# Patient Record
Sex: Male | Born: 1975 | Race: White | Hispanic: No | Marital: Married | Smoking: Never smoker
Health system: Southern US, Community
[De-identification: ages and names within clinical notes are randomized; demographics above are authoritative.]

## PROBLEM LIST (undated history)

## (undated) ENCOUNTER — Emergency Department: Discharge status: Absent without leave | Payer: Self-pay

## (undated) DIAGNOSIS — J189 Pneumonia, unspecified organism: Secondary | ICD-10-CM

## (undated) DIAGNOSIS — Z87442 Personal history of urinary calculi: Secondary | ICD-10-CM

## (undated) DIAGNOSIS — I10 Essential (primary) hypertension: Secondary | ICD-10-CM

## (undated) DIAGNOSIS — T7840XA Allergy, unspecified, initial encounter: Secondary | ICD-10-CM

## (undated) DIAGNOSIS — Z9109 Other allergy status, other than to drugs and biological substances: Secondary | ICD-10-CM

## (undated) DIAGNOSIS — J329 Chronic sinusitis, unspecified: Secondary | ICD-10-CM

## (undated) DIAGNOSIS — G4730 Sleep apnea, unspecified: Secondary | ICD-10-CM

## (undated) DIAGNOSIS — E785 Hyperlipidemia, unspecified: Secondary | ICD-10-CM

## (undated) HISTORY — DX: Allergy, unspecified, initial encounter: T78.40XA

## (undated) HISTORY — PX: WISDOM TOOTH EXTRACTION: SHX21

## (undated) HISTORY — PX: CHOLECYSTECTOMY: SHX55

## (undated) HISTORY — PX: SHOULDER SURGERY: SHX246

## (undated) HISTORY — PX: COLONOSCOPY: SHX174

## (undated) HISTORY — PX: KNEE SURGERY: SHX244

## (undated) HISTORY — PX: BACK SURGERY: SHX140

---

## 2006-06-02 ENCOUNTER — Ambulatory Visit (HOSPITAL_COMMUNITY): Admission: RE | Admit: 2006-06-02 | Discharge: 2006-06-02 | Payer: Self-pay | Admitting: Family Medicine

## 2006-06-03 ENCOUNTER — Inpatient Hospital Stay (HOSPITAL_COMMUNITY): Admission: EM | Admit: 2006-06-03 | Discharge: 2006-06-08 | Payer: Self-pay | Admitting: Emergency Medicine

## 2006-06-06 ENCOUNTER — Encounter (INDEPENDENT_AMBULATORY_CARE_PROVIDER_SITE_OTHER): Payer: Self-pay | Admitting: Specialist

## 2007-04-15 ENCOUNTER — Emergency Department (HOSPITAL_COMMUNITY): Admission: EM | Admit: 2007-04-15 | Discharge: 2007-04-15 | Payer: Self-pay | Admitting: Family Medicine

## 2007-05-06 ENCOUNTER — Emergency Department (HOSPITAL_COMMUNITY): Admission: EM | Admit: 2007-05-06 | Discharge: 2007-05-06 | Payer: Self-pay | Admitting: Emergency Medicine

## 2007-11-05 ENCOUNTER — Encounter: Admission: RE | Admit: 2007-11-05 | Discharge: 2007-11-05 | Payer: Self-pay | Admitting: Family Medicine

## 2007-12-13 ENCOUNTER — Ambulatory Visit: Payer: Self-pay

## 2009-11-21 ENCOUNTER — Emergency Department (HOSPITAL_COMMUNITY): Admission: EM | Admit: 2009-11-21 | Discharge: 2009-11-21 | Payer: Self-pay | Admitting: Emergency Medicine

## 2010-10-05 ENCOUNTER — Encounter: Payer: Self-pay | Admitting: Emergency Medicine

## 2010-10-05 ENCOUNTER — Ambulatory Visit (INDEPENDENT_AMBULATORY_CARE_PROVIDER_SITE_OTHER): Payer: 59 | Admitting: Emergency Medicine

## 2010-10-05 DIAGNOSIS — J069 Acute upper respiratory infection, unspecified: Secondary | ICD-10-CM

## 2010-10-12 ENCOUNTER — Encounter: Payer: Self-pay | Admitting: Family Medicine

## 2010-10-12 ENCOUNTER — Ambulatory Visit (INDEPENDENT_AMBULATORY_CARE_PROVIDER_SITE_OTHER): Payer: 59 | Admitting: Family Medicine

## 2010-10-12 ENCOUNTER — Ambulatory Visit
Admission: RE | Admit: 2010-10-12 | Discharge: 2010-10-12 | Disposition: A | Discharge status: Home / Self Care | Payer: 59 | Source: Ambulatory Visit | Attending: Family Medicine | Admitting: Family Medicine

## 2010-10-12 ENCOUNTER — Other Ambulatory Visit: Payer: Self-pay | Admitting: Family Medicine

## 2010-10-12 DIAGNOSIS — R05 Cough: Secondary | ICD-10-CM

## 2010-10-12 DIAGNOSIS — J45909 Unspecified asthma, uncomplicated: Secondary | ICD-10-CM | POA: Insufficient documentation

## 2010-10-12 DIAGNOSIS — J069 Acute upper respiratory infection, unspecified: Secondary | ICD-10-CM

## 2010-10-12 DIAGNOSIS — R059 Cough, unspecified: Secondary | ICD-10-CM

## 2010-10-12 NOTE — Assessment & Plan Note (Signed)
Summary: SINUS PROBLEMS,COUGH, CHEST PAIN/TJ rm 4   Vital Signs:  Patient Profile:   35 Years Old Male CC:      Cold & URI symptoms Height:     66 inches Weight:      175 pounds O2 Sat:      99 % O2 treatment:    Room Air Temp:     99.2 degrees F oral Pulse rate:   71 / minute Resp:     16 per minute BP sitting:   134 / 88  (left arm) Cuff size:   regular  Vitals Entered By: Clemens Catholic LPN (October 05, 2010 6:53 PM)                  Updated Prior Medication List: cholestrol med fish oil multiple vitamin Current Allergies: ! * BEES, CATS, DOGS, GRASS, WEEDSHistory of Present Illness History from: patient Chief Complaint: Cold & URI symptoms History of Present Illness: 35 Years Old Male complains of onset of cold symptoms for a few weeks.  Jenner has been using Biaxin x7 days which is helping a little bit. + sore throat + cough No pleuritic pain No wheezing + nasal congestion + post-nasal drainage + sinus pain/pressure + chest congestion No itchy/red eyes No earache No hemoptysis No SOB + chills/sweats No fever No nausea No vomiting No abdominal pain No diarrhea No skin rashes + fatigue No myalgias No headache   REVIEW OF SYSTEMS Constitutional Symptoms       Complains of fatigue.     Denies fever, chills, night sweats, weight loss, and weight gain.  Eyes       Denies change in vision, eye pain, eye discharge, glasses, contact lenses, and eye surgery. Ear/Nose/Throat/Mouth       Complains of sinus problems and hoarseness.      Denies hearing loss/aids, change in hearing, ear pain, ear discharge, dizziness, frequent runny nose, frequent nose bleeds, sore throat, and tooth pain or bleeding.  Respiratory       Complains of productive cough, wheezing, shortness of breath, and bronchitis.      Denies dry cough, asthma, and emphysema/COPD.  Cardiovascular       Complains of chest pain.      Denies murmurs and tires easily with exhertion.     Gastrointestinal       Denies stomach pain, nausea/vomiting, diarrhea, constipation, blood in bowel movements, and indigestion. Genitourniary       Denies painful urination, kidney stones, and loss of urinary control. Neurological       Denies paralysis, seizures, and fainting/blackouts. Musculoskeletal       Denies muscle pain, joint pain, joint stiffness, decreased range of motion, redness, swelling, muscle weakness, and gout.  Skin       Denies bruising, unusual mles/lumps or sores, and hair/skin or nail changes.  Psych       Denies mood changes, temper/anger issues, anxiety/stress, speech problems, depression, and sleep problems. Other Comments: pt c/o dry cough, worse at night, chest hurts when he coughs sometimes x . he had an old rx for clarithromycin 500mg  1 by mouth two times a day he has taken that for 7days with no improvment. he has also taken OTC Mucinex and OTC cold meds with no relief.    Past History:  Past Medical History: Unremarkable  Past Surgical History: surgery on both knees back surgery shoulder surgery ear surgery Cholecystectomy  Family History: mother CA and heart dz  Social History: Never Smoked Alcohol  use-yes 3 times per year Drug use-no Smoking Status:  never Drug Use:  no Physical Exam General appearance: well developed, well nourished, no acute distress Ears: mild erythema L ear Nasal: mucosa pink, nonedematous, no septal deviation, turbinates normal Oral/Pharynx: tongue normal, posterior pharynx without erythema or exudate Chest/Lungs: no rales, wheezes, or rhonchi bilateral, breath sounds equal without effort Heart: regular rate and  rhythm, no murmur MSE: oriented to time, place, and person Assessment New Problems: ALLERGIC RHINITIS (ICD-477.9) UPPER RESPIRATORY INFECTION, ACUTE (ICD-465.9)   Patient Education: Patient and/or caregiver instructed in the following: rest, fluids.  Plan New  Medications/Changes: PREDNISONE (PAK) 10 MG TABS (PREDNISONE) use as directed (6 day pack)  #1 x 0, 10/05/2010, Hoyt Koch MD AMOXICILLIN 875 MG TABS (AMOXICILLIN) 1 by mouth two times a day for 7 days  #14 x 0, 10/05/2010, Hoyt Koch MD  New Orders: New Patient Level III [phone] Pulse Oximetry (single measurment) [94760] Solumedrol up to 125mg  [J2930] Admin of Therapeutic Inj  intramuscular or subcutaneous [96372] Planning Comments:   1)  Take the prescribed antibiotic as instructed. (wait a few days since like viral vs allergic) 2)  Use nasal saline solution (over the counter) at least 3 times a day. 3)  Use over the counter decongestants like Zyrtec-D every 12 hours as needed to help with congestion. 4)  Can take tylenol every 6 hours or motrin every 8 hours for pain or fever. 5)  Follow up with your primary doctor  if no improvement in 5-7 days, sooner if increasing pain, fever, or new symptoms.  If worsening or not improving, check CXR and CBC.   The patient and/or caregiver has been counseled thoroughly with regard to medications prescribed including dosage, schedule, interactions, rationale for use, and possible side effects and they verbalize understanding.  Diagnoses and expected course of recovery discussed and will return if not improved as expected or if the condition worsens. Patient and/or caregiver verbalized understanding.  Prescriptions: PREDNISONE (PAK) 10 MG TABS (PREDNISONE) use as directed (6 day pack)  #1 x 0   Entered and Authorized by:   Hoyt Koch MD   Signed by:   Hoyt Koch MD on 10/05/2010   Method used:   Print then Give to Patient   RxID:   [id number] AMOXICILLIN 875 MG TABS (AMOXICILLIN) 1 by mouth two times a day for 7 days  #14 x 0   Entered and Authorized by:   Hoyt Koch MD   Signed by:   Hoyt Koch MD on 10/05/2010   Method used:   Print then Give to Patient   RxID:   [id number]   Medication  Administration  Injection # 1:    Medication: Solumedrol up to 125mg     Diagnosis: UPPER RESPIRATORY INFECTION, ACUTE (ICD-465.9)    Route: IM    Site: RUOQ gluteus    Exp Date: 02/12/2013    Lot #: [id number]    Mfr: Pharmacia    Patient tolerated injection without complications    Given by: Clemens Catholic LPN (October 05, 2010 7:19 PM)  Orders Added: 1)  New Patient Level III [99203] 2)  Pulse Oximetry (single measurment) [94760] 3)  Solumedrol up to 125mg  [J2930] 4)  Admin of Therapeutic Inj  intramuscular or subcutaneous [29528]

## 2010-10-21 NOTE — Assessment & Plan Note (Signed)
Summary: CHEST COUGH/TJ (rm3)   Vital Signs:  Patient Profile:   35 Years Old Male CC:      dry cough, wheeze, ear pain, HA, congestion Height:     66 inches Weight:      182 pounds O2 Sat:      97 % O2 treatment:    Room Air Temp:     98.8 degrees F oral Pulse rate:   67 / minute Resp:     16 per minute BP sitting:   121 / 80  (left arm) Cuff size:   large  Vitals Entered By: Lajean Saver RN (October 12, 2010 12:06 PM)                  Updated Prior Medication List: AMOXICILLIN 875 MG TABS (AMOXICILLIN) 1 by mouth two times a day for 7 days  Current Allergies (reviewed today): ! * BEES, CATS, DOGS, GRASS, WEEDSHistory of Present Illness Chief Complaint: dry cough, wheeze, ear pain, HA, congestion History of Present Illness:  Subjective:  Patient reports no improvement since his previous visit, having almost finished his amoxicillin.  He feels worse today with increased fatigue, and persistent non-producitve cough.  He notes increased left earache for two days and a headache.  No fevers, chills, and sweats. He had asthma as a child, and has had about 3 episodes of pneumonia in the past (last episode last year).  He does not smoke.  No GI symptoms  His tetanus immunization is current.  REVIEW OF SYSTEMS Constitutional Symptoms      Denies fever, chills, night sweats, weight loss, weight gain, and fatigue.  Eyes       Denies change in vision, eye pain, eye discharge, glasses, contact lenses, and eye surgery. Ear/Nose/Throat/Mouth       Complains of ear pain and sinus problems.      Denies hearing loss/aids, change in hearing, ear discharge, dizziness, frequent runny nose, frequent nose bleeds, sore throat, hoarseness, and tooth pain or bleeding.  Respiratory       Complains of dry cough, wheezing, and shortness of breath.      Denies productive cough, asthma, bronchitis, and emphysema/COPD.  Cardiovascular       Denies murmurs, chest pain, and tires easily with  exhertion.    Gastrointestinal       Denies stomach pain, nausea/vomiting, diarrhea, constipation, blood in bowel movements, and indigestion. Genitourniary       Denies painful urination, kidney stones, and loss of urinary control. Neurological       Complains of headaches.      Denies paralysis, seizures, and fainting/blackouts. Musculoskeletal       Denies muscle pain, joint pain, joint stiffness, decreased range of motion, redness, swelling, muscle weakness, and gout.  Skin       Denies bruising, unusual mles/lumps or sores, and hair/skin or nail changes.  Psych       Denies mood changes, temper/anger issues, anxiety/stress, speech problems, depression, and sleep problems. Other Comments: Patient was seen by Dr. Orson Aloe about 1 week ago for similiar symptoms that have worsened since. He finished his prednisone and is still taking amox.   Past History:  Past Medical History: Reviewed history from 10/05/2010 and no changes required. Unremarkable  Past Surgical History: Reviewed history from 10/05/2010 and no changes required. surgery on both knees back surgery shoulder surgery ear surgery Cholecystectomy  Family History: Reviewed history from 10/05/2010 and no changes required. mother CA and heart dz  Social History:  Reviewed history from 10/05/2010 and no changes required. Never Smoked Alcohol use-yes 3 times per year Drug use-no   Objective:  Appearance:  Patient appears healthy, stated age, and in no acute distress  Eyes:  Pupils are equal, round, and reactive to light and accomdation.  Extraocular movement is intact.  Conjunctivae are not inflamed.  Ears:  Canals normal.  Tympanic membranes normal.   Nose:  Normal septum.  Normal turbinates, mildly congested.  No sinus tenderness present.  Pharynx:  Normal  Neck:  Supple.  Slightly tender shotty anterior/posterior nodes are palpated bilaterally.  Lungs:  Clear to auscultation.  Breath sounds are equal.  Heart:   Regular rate and rhythm without murmurs, rubs, or gallops.  Abdomen:  Nontender without masses or hepatosplenomegaly.  Bowel sounds are present.  No CVA or flank tenderness.  Extremities:  No edema.  Pedal pulses are full and equal.  Chest X-ray:   IMPRESSION: No active cardiopulmonary process. CBC:  WBC 9.7; Normal diff; Hgb 16.9 Assessment New Problems: Hx of ASTHMA, MILD (ICD-493.90) COUGH (ICD-786.2)  SUSPECT THAT PRESENT INCREASE IN SYMPTOMS REPRESENTS NEW VIRAL URI SUSPECT PRE-EXISTING COUGH FOR 5 WEEKS REPRESENTS ASTHMA  Plan New Medications/Changes: AZITHROMYCIN 250 MG TABS (AZITHROMYCIN) Two tabs by mouth on day 1, then 1 tab daily on days 2 through 5 (Rx void after 10/21/10)  #6 tabs x 0, 10/12/2010, Donna Christen MD VENTOLIN HFA 108 (90 BASE) MCG/ACT AERS (ALBUTEROL SULFATE) 2 inh. q3 to 4hr prn  #1 MDI x 1, 10/12/2010, Donna Christen MD ADVAIR DISKUS 100-50 MCG/DOSE MISC (FLUTICASONE-SALMETEROL) 1 inhalation two times a day  #1 disp pk 60 x 0, 10/12/2010, Donna Christen MD SINGULAIR 10 MG TABS (MONTELUKAST SODIUM) 1 by mouth qhs  #30 x 1, 10/12/2010, Donna Christen MD  New Orders: CBC w/Diff [[id number]] T-Chest x-ray, 2 views [71020] Pulse Oximetry (single measurment) [94760] Est. Patient Level IV [16010] Planning Comments:   Begin Singulair, Advair, and albuterol as needed. Treat URI symptomatically for now:  Increase fluid intake, begin expectorant/decongestant, cough suppressant at bedtime.  If fever/chills/sweats persist, or if not improving 5 to 7 days begin Z-pack (given Rx to hold).  Followup with pulmonologist if not improving 2 weeks.   The patient and/or caregiver has been counseled thoroughly with regard to medications prescribed including dosage, schedule, interactions, rationale for use, and possible side effects and they verbalize understanding.  Diagnoses and expected course of recovery discussed and will return if not improved as expected or if the condition  worsens. Patient and/or caregiver verbalized understanding.  Prescriptions: AZITHROMYCIN 250 MG TABS (AZITHROMYCIN) Two tabs by mouth on day 1, then 1 tab daily on days 2 through 5 (Rx void after 10/21/10)  #6 tabs x 0   Entered and Authorized by:   Donna Christen MD   Signed by:   Donna Christen MD on 10/12/2010   Method used:   Print then Give to Patient   RxID:   [id number] VENTOLIN HFA 108 (90 BASE) MCG/ACT AERS (ALBUTEROL SULFATE) 2 inh. q3 to 4hr prn  #1 MDI x 1   Entered and Authorized by:   Donna Christen MD   Signed by:   Donna Christen MD on 10/12/2010   Method used:   Print then Give to Patient   RxID:   [id number] ADVAIR DISKUS 100-50 MCG/DOSE MISC (FLUTICASONE-SALMETEROL) 1 inhalation two times a day  #1 disp pk 60 x 0   Entered and Authorized by:   Donna Christen MD   Signed by:  Donna Christen MD on 10/12/2010   Method used:   Print then Give to Patient   RxID:   [id number] SINGULAIR 10 MG TABS (MONTELUKAST SODIUM) 1 by mouth qhs  #30 x 1   Entered and Authorized by:   Donna Christen MD   Signed by:   Donna Christen MD on 10/12/2010   Method used:   Print then Give to Patient   RxID:   [id number]   Patient Instructions: 1)  Take Mucinex D (guaifenesin with decongestant) twice daily for congestion. 2)  Increase fluid intake, rest. 3)  May use Afrin nasal spray (or generic oxymetazoline) twice daily for about 5 days.  Also recommend using saline nasal spray several times daily and/or saline nasal irrigation. 4)  Begin Azithromycin if not improving about 5 to 7 days or if persistent fever develops. 5)  Followup with pulmonologist if not improving 2 weeks.  Orders Added: 1)  CBC w/Diff [[id number]] 2)  T-Chest x-ray, 2 views [71020] 3)  Pulse Oximetry (single measurment) [94760] 4)  Est. Patient Level IV [41324]

## 2010-12-31 NOTE — Op Note (Signed)
NAMEISIAHA, GREENUP                ACCOUNT NO.:  [id number]   MEDICAL RECORD NO.:  [id number]          PATIENT TYPE:  INP   LOCATION:  5727                         FACILITY:  [facility]   PHYSICIAN:  Thornton Park. Daphine Deutscher, MD  DATE OF BIRTH:  [birth year]   DATE OF PROCEDURE:  06/06/2006  DATE OF DISCHARGE:                                 OPERATIVE REPORT   PREOPERATIVE DIAGNOSIS:  Biliary colic, chronic cholecystitis.   POSTOPERATIVE DIAGNOSIS:  Biliary colic, chronic cholecystitis.   PROCEDURE:  Laparoscopic cholecystectomy, interoperative cholangiogram.   SURGEON:  Thornton Park. Daphine Deutscher, M.D.   ASSISTANT:  Towana Badger, M.D.   ANESTHESIA:  General endotracheal anesthesia.   DRAINS:  None.   BLOOD LOSS:  Minimal.   DESCRIPTION OF PROCEDURE:  Mr. Pitera is a 35 year old white male taken to OR  17 on Tuesday, June 06, 2006, and given general anesthesia.  The abdomen  was prepped with chlorhexidine and draped sterilely.  I made a longitudinal  incision down to the umbilicus and found a small umbilical hernia at the  base which I opened and entered the abdomen without difficulty.  A single  suture was placed to hold the [facility] which was then insufflated.  The  gallbladder was grasped and elevated and I dissected free the cystic duct  and put a clip up on the gallbladder side of the cystic duct.  I put clips  up on the cystic artery and incised the cystic duct and milked out a large  impacted stone as well as several areas of sludge and I put in a Reddick  catheter.  A dynamic cholangiogram was obtained which showed filling of the  fairly long cystic duct and a dilated common duct with flow into the  duodenum.  The cystic duct was then triple clipped and divided. The cystic  artery was divided.  Then, the gallbladder was removed from the gallbladder  bed without entering it using the hook electrocautery.  It was placed in a  bag and brought out through the umbilicus.  I inspected the  gallbladder bed  and no bleeding or bile leaks were noted.  The umbilical defect was repaired  with two sutures of 0 Vicryl in addition to the stay suture that was placed.  I inspected and,  again, no bleeding or bile leaks were noted.  The wounds were injected with  Marcaine and closed with 4-0 Vicryl, Benzoin, and Steri-Strips.  The patient  tolerated the procedure well and was taken to the recovery room in  satisfactory condition.      Thornton Park Daphine Deutscher, MD  Electronically Signed     MBM/MEDQ  D:  06/06/2006  T:  06/07/2006  Job:  [id number]   cc:   Bernette Redbird, M.D.

## 2010-12-31 NOTE — H&P (Signed)
Allen Fisher, DESA NO.:  [id number]   MEDICAL RECORD NO.:  [id number]          PATIENT TYPE:  INP   LOCATION:  5727                         FACILITY:  [facility]   PHYSICIAN:  Bernette Redbird, M.D.   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   Keyshun has been seen by me on one occasion previously in the office, about  a month ago, because of longstanding IBS symptoms characterized by  postprandial urgency of defecation.  He was taken off his Nexium (which he  had been treated for since he was diagnosed with an ulcer some time ago) and  switched to Protonix, and also started on probiotic therapy.  This improved  the diarrhea but led to a sense of bloating and fullness.   With that background, the patient ate a fairly normal meal last night,  followed by a large amount of ice cream and within a couple of hours,  started having significant upper abdominal pain as well as nausea and  vomiting, but no fevers or chills.  From 2:30 in the morning until the  present time, he has been awake with recurring intermittent colicky upper  abdominal pain, not radiating to the back, along with some additional nausea  and vomiting.   Of note, there is a family history of gallbladder disease in his brother who  had his gallbladder removed a year ago at age 5.   The patient called our office to report the above symptoms and was sent to  an Urgent Care Center where apparently a white count was elevated at about  11,000 and he was sent for CT scan as an outpatient this evening which  showed gallstones and mild to moderate pancreatitis.  Upon receipt of that  information, I sent the patient to the emergency room.   At the present time, the patient is in mild to moderate pain, not as  severe  as it has been earlier, during which time there had been points where he has  actually been on the floor writhing in pain.   Also of note is fact that the patient has a history of Gilbert's Syndrome.  He has noticed some dark urine during the course of this illness.   ALLERGIES:  NO KNOWN ALLERGIES.   OUTPATIENT MEDICATIONS:  1. Protonix 40 mg daily.  2. Align probiotic.  3. He also took some Alka-Seltzer and Gas-X today because of his symptoms.   OPERATIONS:  Previous back surgery and knee surgery.   MEDICAL ILLNESSES:  1. He was diagnosed with an ulcer, I believe by endoscopy, in [place] a      year or two ago.  He was started on Nexium at that time.  He has been      on it ever since until I switched him to Protonix about a month ago.  2. There is also history of Gilbert's  Syndrome and, per his wife's report,      he apparently typically runs bilirubins in the 7-8 range.  3. There is no history of cardiopulmonary disease, diabetes, hypertension      or asthma.   HABITS:  Nonsmoker, moderate ethanol.  He did have four beers a couple of  days ago but prior to that, it had been awhile since he had had any alcohol  at all.   FAMILY HISTORY:  Family history positive for gallbladder disease in his  brother, who had his gallbladder removed last year at age 53.  Also positive  for diverticulitis in his father, negative for colon cancer.   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  His wife has some family members in town.  They have a 35-month-  old child.   REVIEW OF SYSTEMS:  Pertinent per HPI.   PHYSICAL EXAMINATION:  GENERAL APPEARANCE:  A well-developed, stout, healthy-  appearing white male who is not in any acute distress at the time of my  evaluation.  VITAL SIGNS:  Temperature 97.1, blood pressure 123/85, pulse 83,  respirations 16 and  unlabored.  HEENT:  The sclerae are mildly icteric.  CHEST:  Clear to auscultation.  HEART:  Normal, no gallops, rubs, murmurs, clicks or arrhythmias.  ABDOMEN:  The abdomen is nondistended.  Bowel sounds are quiet.  No  organomegaly, rigidity or mass effect are noted.  There is no significant  guarding and only mild tenderness to palpation and this was performed before  pain medications were administered.  NEUROLOGIC:  The patient is alert, coherent and appropriate without obvious  cranial nerve or focal motor deficits.   LABORATORY DATA:  Here at the emergency room, the patient's white count is  8700 with 75 polys, 13 lymphs and 9 monocytes.  Hemoglobin is elevated at  17.2 with a hematocrit of 50.  Platelets 158,000.  INR is normal at 1.  Chemistry panel, amylase and lipase are pending at present.   CT scan:  See above report.  Gallstones and moderate pancreatitis are noted.  I discussed this with the radiologist.   IMPRESSION:  1. Abdominal pain of 24 hours duration, with CT findings consistent with      pancreatitis of mild to moderate severity.  2. Gallstones.  3. History of irritable bowel syndrome.  4. History of peptic ulcer disease.  5. History of Gilbert's Syndrome.  6. History of back and knee surgery.   DISCUSSION:  Almost certainly this is gallstone pancreatitis.  The patient  does not have a history of significant ethanol consumption and I think his  recent moderate intake is unrelated to the clinical presentation.  The  pancreatitis appears to be of mild to moderate clinical severity, although  the hemoconcentration characterized by the high hemoglobin level is of some  concern.   PLAN:  1. Aggressive hydration.  2. Symptomatic management of pain and nausea.  3. Observe the patient's clinical evolution by following symptoms,      examination and labs.  4. Surgical consultation in the morning. 5. Further management will depend on the patient's clinical  evolution,      including his response to the above interventions.  He may need      preoperative ERCP, and I would anticipate he may well end up with a      cholecystectomy on this admission,  although hopefully not on an      emergent basis.           ______________________________  Bernette Redbird, M.D.     RB/MEDQ  D:  06/02/2006  T:  06/04/2006  Job:  [id number]

## 2010-12-31 NOTE — Discharge Summary (Signed)
NAMEKENNET, Allen Fisher                ACCOUNT NO.:  [id number]   MEDICAL RECORD NO.:  [id number]          PATIENT TYPE:  INP   LOCATION:  5727                         FACILITY:  [facility]   PHYSICIAN:  Graylin Shiver, M.D.   DATE OF BIRTH:  1976-08-06   DATE OF ADMISSION:  06/02/2006  DATE OF DISCHARGE:  06/08/2006                                 DISCHARGE SUMMARY   ADMISSION DIAGNOSIS:  Acute pancreatitis.   DISCHARGE DIAGNOSES:  1. Resolving pancreatitis.  2. Irritable bowel syndrome.  3. Cholelithiasis.  4. Questionable Guillain Barre's syndrome.  He is now status post      laparoscopic cholecystectomy.   SERVICE:  With gastroenterology, attended with Bernette Redbird.   CONSULT:  [facility].   PROCEDURES:  ERCP on October 22 by Dr. Herbert Moors of [facility], and  laparoscopic cholecystectomy on October 23 by Dr. Haynes Hoehn, [facility].   BRIEF HISTORY:  Mr. Rudd was admitted on October 19 after seeing Dr. Matthias Hughs  in the office.  He had a white count of 11,000 and a CT scan that showed  gallstones and mild to moderate pancreatitis.  He had a 3-week history of  epigastric pain, just recently causing him to double over in pain and  __________ .  He was admitted, underwent the two previously mentioned  procedures, is now recovering very well.  Current vital signs were  temperature of 98.6, respirations of 20, pulse of 54.  Blood pressure is  101/44. CBC is normal, with the exception of platelets of 147.  Chem-7 is  normal.  Liver enzymes show a slightly elevated ALT x4.  His total bilirubin  is 1.1 which is excellent for him.  His lipase was slightly elevated at 74.  His amylase is normal at one 118.   PHYSICAL EXAM:  He is alert, oriented, tolerating clear liquids well.  We  are advancing his diet at lunchtime today.  He is fully using his PCA pump  that is being switched to oral Vicodin.  Still complains of a good amount of  pain from his recent  Procedures, right upper quadrant pain.  He also says he  feels that nauseous and has the hiccups.  We believe this is due to his pain  medication, but he is up and about, ambulating in hallways and report that  he feels like he is ready to go home.   FOLLOW-UP INSTRUCTIONS:  From [facility], included appointment  with Dr. Luretha Murphy in 3 weeks.  They said that as soon as he is off  pain medications and feels up to it, he can return to work.  So I have  written him out of work until October the 30th, with the condition that he  can go back to work on that day if he is off pain medications and feels up  to it.  They have instructed the nurses to remove his bandages, Steri-Strip  his wounds.  He is able to go home and shower as needed.  I have instructed  him that if he notices  any increased erythema or  drainage of his surgical incision site, to call [facility].  He also has an appointment to follow up with Dr. Matthias Hughs in the office on  November 6th, as he is being discharged in stable condition on Protonix,  Vicodin #20 And Zofran p.r.n. nausea.      Stephani Police, PA    ______________________________  Graylin Shiver, M.D.    MLY/MEDQ  D:  06/08/2006  T:  06/09/2006  Job:  [id number]   cc:   Bernette Redbird, M.D.  Graylin Shiver, M.D.  [place] Daphine Deutscher, MD

## 2010-12-31 NOTE — Consult Note (Signed)
Allen Fisher, Allen Fisher                ACCOUNT NO.:  [id number]   MEDICAL RECORD NO.:  [id number]          PATIENT TYPE:  INP   LOCATION:  5727                         FACILITY:  [facility]   PHYSICIAN:  Thomas A. Cornett, M.D.DATE OF BIRTH:  01-22-1976   DATE OF CONSULTATION:  DATE OF DISCHARGE:                                   CONSULTATION   CHIEF COMPLAINT:  Abdominal pain.   PHYSICIAN REQUESTING CONSULTATION:  Dr. Matthias Hughs.   HISTORY OF PRESENT ILLNESS:  The patient is a 35 year old white male with a  3- to 4-week history of epigastric abdominal pain after meals.  This  worsened 2 days ago to severe epigastric and right upper quadrant pain with  radiation to his back.  It doubled him over.  It was associated with nausea  and vomiting.  It was constant.  It did not get any better.  He sought care  at the hospital for this.  This is associated with eating.  I was asked to  see the patient at the request of Dr. Matthias Hughs in consultation for this.   PAST MEDICAL HISTORY:  1. Gilbert's syndrome.  2. IBS.   PAST SURGICAL HISTORY:  None.   ALLERGIES:  NONE.   FAMILY HISTORY:  Positive for gallstones in his brother.   CURRENT MEDICATIONS:  None.   SOCIAL HISTORY:  Denies any current tobacco or alcohol use.   REVIEW OF SYSTEMS:  A 15-point review of systems negative, otherwise; as  stated above.   PHYSICAL EXAMINATION:  VITAL SIGNS:  Temperature 97.5, blood pressure 90/60,  pulse 56.  GENERAL APPEARANCE:  Alert and oriented in no apparent distress.  HEENT:  Mild scleral icterus noted.  Oropharynx moist.  Normocephalic,  atraumatic.  NECK:  Supple and nontender with no masses.  CHEST:  Chest wall motion is normal.  No tenderness.  RESPIRATORY:  Clear to auscultation bilaterally without rhonchi or rales.  CARDIOVASCULAR:  Regular rate and rhythm without tachycardia.  ABDOMEN:  Tender in the epigastrium without rebound or guarding.  No  significant mass to palpation.  No hernia.  EXTREMITIES:  Muscle tone normal.  Range of motion normal.  NEUROLOGICAL EXAMINATION:  Motor and sensory function grossly intact.  Cranial nerves 2-12 are grossly intact.   LABORATORY DATA:  Abdominal CT scan was reviewed today, which showed  pancreatitis and gallstones.  An ultrasound revealed gallstones as well.  Lab work shows a white count of 70,500; hemoglobin of 15; platelet count is  136,000.  Direct bilirubin is 0.5 and total is 4.1.  Sodium 140, potassium  3.9, chloride 110, CO2 27, BUN 7, creatinine 0.4, glucose 119.  Amylase is  459 and lipase is 536.   IMPRESSION:  1. Gallstone pancreatitis  2. Gilbert's syndrome.  3. Irritable bowel syndrome.   PLAN:  I agree with making the patient NPO with IV fluids.  He will need an  elective cholecystectomy sometime next week with Dr. Daphine Deutscher, who is the  doctor of the week.  Otherwise, we will continue monitoring his amylase,  lipase, and liver function.      Maisie Fus  Liberty Handy, M.D.  Electronically Signed     TAC/MEDQ  D:  06/03/2006  T:  06/04/2006  Job:  [id number]

## 2010-12-31 NOTE — Op Note (Signed)
Allen Fisher, Allen Fisher                ACCOUNT NO.:  [id number]   MEDICAL RECORD NO.:  [id number]          PATIENT TYPE:  INP   LOCATION:  5727                         FACILITY:  [facility]   PHYSICIAN:  Graylin Shiver, M.D.   DATE OF BIRTH:  [birth year]   DATE OF PROCEDURE:  06/05/2006  DATE OF DISCHARGE:                                 OPERATIVE REPORT   INDICATIONS FOR PROCEDURE:  Gallstone pancreatitis. The patient had imaging  studies showing gallstones.  Also, there is suspicion of  choledocholithiasis.  His amylase and lipase have improved.  Clinically, he  is improved, and he is planning to go for laparoscopic cholecystectomy  tomorrow.   Informed consent was obtained after explanation of the risks of bleeding,  infection, perforation, pancreatitis.   PROCEDURE:  The patient was sedated with fentanyl 100 mcg IV, Versed 10 mg  IV and Phenergan 25 mg IV.  Glucagon 1 mg IV was given during the procedure  for bowel spasm.   With the patient lying on his abdomen on the fluoroscopy table, the lateral  viewing duodenum scope was inserted into the oropharynx and passed into the  esophagus.  It was advanced down the esophagus, then into the stomach.  No  specific lesions were seen in the stomach.  The duodenum looked normal.  The  papilla of Vater was located.  It appeared to be slightly swollen.  Selective cannulation was achieved of the common bile duct using a guidewire  and papillotome.  Cannulation of the pancreatic duct was not done.  Contrast  was then injected into the biliary tree.  There were some filling defects  seen in the distal to mid common bile duct which were suspected to be small  stones.  A sphincterotomy was then made after proper localization of the  sphincterotome, and a good sphincterotomy was achieved without bleeding.  The catheter was then removed, leaving the guidewire in place.  A balloon  was then threaded over the guidewire and placed in the proximal bile  duct.  It was inflated to 9 mm, and the duct was swept.  I did not see any obvious  stones extracted from the duct.  However, visualization at this point of the  lumen was not good because of spasm.  We lost cannulation of the guidewire  at that time after the first of balloon pull-through.  I then reinserted a  guidewire and papillotome and recannulated the biliary tree and placed a  guidewire up proximally again.  The sphincterotome was then removed and the  balloon was then reinserted, and an occlusion cholangiogram was done.  I  then swept the duct again.  The final occlusion cholangiogram appeared to be  clear.  I saw no evidence of further filling defects.  I did not see any  stones actually extracted.  I did see a little bit of debris on the  catheter.  The patient tolerated the procedure well without immediate  complications.  He was returned to the recovery room of endoscopy.   IMPRESSION:  Choledocholithiasis status post sphincterotomy.  ______________________________  Graylin Shiver, M.D.     SFG/MEDQ  D:  06/05/2006  T:  06/06/2006  Job:  [id number]

## 2011-01-23 ENCOUNTER — Encounter: Payer: Self-pay | Admitting: Family Medicine

## 2011-01-23 ENCOUNTER — Inpatient Hospital Stay (INDEPENDENT_AMBULATORY_CARE_PROVIDER_SITE_OTHER)
Admission: RE | Admit: 2011-01-23 | Discharge: 2011-01-23 | Disposition: A | Discharge status: Home / Self Care | Payer: 59 | Source: Ambulatory Visit | Attending: Family Medicine | Admitting: Family Medicine

## 2011-01-23 DIAGNOSIS — J45909 Unspecified asthma, uncomplicated: Secondary | ICD-10-CM

## 2011-01-23 DIAGNOSIS — J069 Acute upper respiratory infection, unspecified: Secondary | ICD-10-CM

## 2011-01-23 DIAGNOSIS — J209 Acute bronchitis, unspecified: Secondary | ICD-10-CM

## 2011-04-12 ENCOUNTER — Inpatient Hospital Stay (INDEPENDENT_AMBULATORY_CARE_PROVIDER_SITE_OTHER)
Admission: RE | Admit: 2011-04-12 | Discharge: 2011-04-12 | Disposition: A | Discharge status: Home / Self Care | Payer: 59 | Source: Ambulatory Visit | Attending: Family Medicine | Admitting: Family Medicine

## 2011-04-12 ENCOUNTER — Encounter: Payer: Self-pay | Admitting: Family Medicine

## 2011-04-12 DIAGNOSIS — J209 Acute bronchitis, unspecified: Secondary | ICD-10-CM

## 2011-04-12 DIAGNOSIS — J019 Acute sinusitis, unspecified: Secondary | ICD-10-CM

## 2011-04-12 DIAGNOSIS — R05 Cough: Secondary | ICD-10-CM

## 2011-07-18 NOTE — Progress Notes (Signed)
Summary: cold?/TM (room 4)   Vital Signs:  Patient Profile:   35 Years Old Male CC:      URI x 7 days Height:     66 inches Weight:      180 pounds O2 Sat:      98 % O2 treatment:    Room Air Temp:     98.7 degrees F oral Pulse rate:   80 / minute Resp:     18 per minute BP sitting:   117 / 73  (left arm) Cuff size:   regular  Pt. in pain?   no  Vitals Entered By: Lavell Islam RN (January 23, 2011 2:12 PM)                   Prior Medication List:  AMOXICILLIN 875 MG TABS (AMOXICILLIN) 1 by mouth two times a day for 7 days SINGULAIR 10 MG TABS (MONTELUKAST SODIUM) 1 by mouth qhs ADVAIR DISKUS 100-50 MCG/DOSE MISC (FLUTICASONE-SALMETEROL) 1 inhalation two times a day VENTOLIN HFA 108 (90 BASE) MCG/ACT AERS (ALBUTEROL SULFATE) 2 inh. q3 to 4hr prn AZITHROMYCIN 250 MG TABS (AZITHROMYCIN) Two tabs by mouth on day 1, then 1 tab daily on days 2 through 5 (Rx void after 10/21/10)   Updated Prior Medication List: SINGULAIR 10 MG TABS (MONTELUKAST SODIUM) 1 by mouth qhs  Current Allergies (reviewed today): ! * BEES, CATS, DOGS, GRASS, WEEDSHistory of Present Illness Chief Complaint: URI x 7 days History of Present Illness:  Subjective: Patient complains URI symptoms for one week, now developing increased chest congestion and wheezing. Initial sore throat resolved + cough worse at night. No pleuritic pain + nasal congestion + post-nasal drainage No sinus pain/pressure No itchy/red eyes No earache No hemoptysis No SOB No fever/chills No nausea No vomiting No abdominal pain No diarrhea No skin rashes + fatigue No myalgias No headache Used OTC meds without relief   REVIEW OF SYSTEMS Constitutional Symptoms       Complains of fatigue.     Denies fever, chills, night sweats, weight loss, and weight gain.  Eyes       Denies change in vision, eye pain, eye discharge, glasses, contact lenses, and eye surgery. Ear/Nose/Throat/Mouth       Complains of ear pain, sinus  problems, and hoarseness.      Denies hearing loss/aids, change in hearing, ear discharge, dizziness, frequent runny nose, frequent nose bleeds, sore throat, and tooth pain or bleeding.  Respiratory       Complains of dry cough and wheezing.      Denies productive cough, shortness of breath, asthma, bronchitis, and emphysema/COPD.  Cardiovascular       Denies murmurs, chest pain, and tires easily with exhertion.    Gastrointestinal       Denies stomach pain, nausea/vomiting, diarrhea, constipation, blood in bowel movements, and indigestion. Genitourniary       Denies painful urination, kidney stones, and loss of urinary control. Neurological       Denies paralysis, seizures, and fainting/blackouts. Musculoskeletal       Denies muscle pain, joint pain, joint stiffness, decreased range of motion, redness, swelling, muscle weakness, and gout.  Skin       Denies bruising, unusual mles/lumps or sores, and hair/skin or nail changes.  Psych       Denies mood changes, temper/anger issues, anxiety/stress, speech problems, depression, and sleep problems. Other Comments: URI x 7 days; hx bronchitis/pneumonia   Past History:  Family History: Last updated: 10/05/2010  mother CA and heart dz  Social History: Last updated: 10/05/2010 Never Smoked Alcohol use-yes 3 times per year Drug use-no  Past Medical History: seasonal allergies bronchitis pneumonia  Past Surgical History: Reviewed history from 10/05/2010 and no changes required. surgery on both knees back surgery shoulder surgery ear surgery Cholecystectomy  Family History: Reviewed history from 10/05/2010 and no changes required. mother CA and heart dz  Social History: Reviewed history from 10/05/2010 and no changes required. Never Smoked Alcohol use-yes 3 times per year Drug use-no   Objective:  Appearance:  Patient appears healthy, stated age, and in no acute distress  Eyes:  Pupils are equal, round, and reactive to  light and accomodation.  Extraocular movement is intact.  Conjunctivae are not inflamed.  Ears:  Canals normal.  Tympanic membranes normal.   Nose:  Mildly congested turbinates.  No sinus tenderness  Pharynx:  Normal  Neck:  Supple.  Slightly tender shotty posterior nodes are palpated bilaterally.  Lungs:  Breath sounds somewhat tubular.  Breath sounds are equal.  Heart:  Regular rate and rhythm without murmurs, rubs, or gallops.  Abdomen:  Nontender without masses or hepatosplenomegaly.  Bowel sounds are present.  No CVA or flank tenderness.  Extremities:  No edema. .  Assessment New Problems: BRONCHITIS, ACUTE WITH MILD BRONCHOSPASM (ICD-466.0) UPPER RESPIRATORY INFECTION, ACUTE (ICD-465.9)   Plan New Medications/Changes: BENZONATATE 200 MG CAPS (BENZONATATE) One by mouth hs as needed cough  #12 x 0, 01/23/2011, Donna Christen MD PREDNISONE 10 MG TABS (PREDNISONE) 2 PO BID for 2 days, then 1 BID for 2 days, then 1 daily for 2 days.  Take PC.  Start Monday  #14 x 0, 01/23/2011, Donna Christen MD AZITHROMYCIN 250 MG TABS (AZITHROMYCIN) Two tabs by mouth on day 1, then 1 tab daily on days 2 through 5  #6 tabs x 0, 01/23/2011, Donna Christen MD  New Orders: Depo- Medrol 80mg  [J1040] Admin of Therapeutic Inj  intramuscular or subcutaneous [96372] Pulse Oximetry (single measurment) [94760] Est. Patient Level IV [16109] Planning Comments:   Depo Medrol 80mg  IM Begin Z-pack, tapering course of prednisone, expectorant/decongestant, cough suppressant at bedtime.  Increase fluid intake Followup with PCP if not improving 7 to 10 days   The patient and/or caregiver has been counseled thoroughly with regard to medications prescribed including dosage, schedule, interactions, rationale for use, and possible side effects and they verbalize understanding.  Diagnoses and expected course of recovery discussed and will return if not improved as expected or if the condition worsens. Patient and/or caregiver  verbalized understanding.  Prescriptions: BENZONATATE 200 MG CAPS (BENZONATATE) One by mouth hs as needed cough  #12 x 0   Entered and Authorized by:   Donna Christen MD   Signed by:   Donna Christen MD on 01/23/2011   Method used:   Print then Give to Patient   RxID:   [id number] PREDNISONE 10 MG TABS (PREDNISONE) 2 PO BID for 2 days, then 1 BID for 2 days, then 1 daily for 2 days.  Take PC.  Start Monday  #14 x 0   Entered and Authorized by:   Donna Christen MD   Signed by:   Donna Christen MD on 01/23/2011   Method used:   Print then Give to Patient   RxID:   [id number] AZITHROMYCIN 250 MG TABS (AZITHROMYCIN) Two tabs by mouth on day 1, then 1 tab daily on days 2 through 5  #6 tabs x 0   Entered and Authorized by:  Donna Christen MD   Signed by:   Donna Christen MD on 01/23/2011   Method used:   Print then Give to Patient   RxID:   [id number]   Medication Administration  Injection # 1:    Medication: Depo- Medrol 80mg     Diagnosis: BRONCHITIS, ACUTE WITH MILD BRONCHOSPASM (ICD-466.0)    Route: IM    Site: L thigh    Exp Date: 06/2011    Lot #: [id number]    Mfr: [employer]    Comments: bronchitis    Patient tolerated injection without complications    Given by: Lavell Islam RN (January 23, 2011 3:10 PM)  Orders Added: 1)  Depo- Medrol 80mg  [J1040] 2)  Admin of Therapeutic Inj  intramuscular or subcutaneous [96372] 3)  Pulse Oximetry (single measurment) [94760] 4)  Est. Patient Level IV [13086]

## 2011-07-18 NOTE — Progress Notes (Signed)
Summary: cough/congestion in head rm 5   Vital Signs:  Patient Profile:   35 Years Old Male CC:      cough and congestion x 3 wks Height:     66 inches Weight:      179.50 pounds O2 Sat:      97 % O2 treatment:    Room Air Temp:     97.8 degrees F oral Pulse rate:   64 / minute Resp:     18 per minute BP sitting:   108 / 73  (left arm) Cuff size:   large  Vitals Entered By: Clemens Catholic LPN (April 12, 2011 8:41 AM)                  Prior Medication List:  SINGULAIR 10 MG TABS (MONTELUKAST SODIUM) 1 by mouth qhs AZITHROMYCIN 250 MG TABS (AZITHROMYCIN) Two tabs by mouth on day 1, then 1 tab daily on days 2 through 5 PREDNISONE 10 MG TABS (PREDNISONE) 2 PO BID for 2 days, then 1 BID for 2 days, then 1 daily for 2 days.  Take PC.  Start Monday BENZONATATE 200 MG CAPS (BENZONATATE) One by mouth hs as needed cough   Updated Prior Medication List: FISH OIL 1000 MG CAPS (OMEGA-3 FATTY ACIDS)  DAILY MULTI  TABS (MULTIPLE VITAMINS-MINERALS)  cholestrol med Current Allergies (reviewed today): ! * BEES, CATS, DOGS, GRASS, WEEDSHistory of Present Illness Chief Complaint: cough and congestion x 3 wks History of Present Illness: Coughing and congested now for 3 weeks. Hx of allergies and usually they get worse.and develpos into sinus infections and bronchitis. He feels as if something is developing  now. He usually gets asteroid injection but did not this summer.  Current Problems: BRONCHITIS, ACUTE (ICD-466.0) ACUTE SINUSITIS, UNSPECIFIED (ICD-461.9) COUGH (ICD-786.2) Hx of ASTHMA, MILD (ICD-493.90)   Current Meds FISH OIL 1000 MG CAPS (OMEGA-3 FATTY ACIDS)  DAILY MULTI  TABS (MULTIPLE VITAMINS-MINERALS)  AUGMENTIN 875-125 MG TABS (AMOXICILLIN-POT CLAVULANATE) 1 by mouth 2 times daily FLONASE 50 MCG/ACT SUSP (FLUTICASONE PROPIONATE) 2 puffs each nostril q day TUSSIONEX PENNKINETIC ER 8-10 MG/5ML LQCR (CHLORPHENIRAMINE-HYDROCODONE) 1 tsp by mouth q day  REVIEW OF  SYSTEMS Constitutional Symptoms      Denies fever, chills, night sweats, weight loss, weight gain, and fatigue.  Eyes       Denies change in vision, eye pain, eye discharge, glasses, contact lenses, and eye surgery. Ear/Nose/Throat/Mouth       Denies hearing loss/aids, change in hearing, ear pain, ear discharge, dizziness, frequent runny nose, frequent nose bleeds, sinus problems, sore throat, hoarseness, and tooth pain or bleeding.  Respiratory       Denies dry cough, productive cough, wheezing, shortness of breath, asthma, bronchitis, and emphysema/COPD.  Cardiovascular       Denies murmurs, chest pain, and tires easily with exhertion.    Gastrointestinal       Denies stomach pain, nausea/vomiting, diarrhea, constipation, blood in bowel movements, and indigestion. Genitourniary       Denies painful urination, kidney stones, and loss of urinary control. Neurological       Denies paralysis, seizures, and fainting/blackouts. Musculoskeletal       Denies muscle pain, joint pain, joint stiffness, decreased range of motion, redness, swelling, muscle weakness, and gout.  Skin       Denies bruising, unusual mles/lumps or sores, and hair/skin or nail changes.  Psych       Denies mood changes, temper/anger issues, anxiety/stress, speech problems, depression, and sleep  problems. Other Comments: pt c/o dry and productive cough and chest congestion, yellow phelgm, and fatigue. he has a hx of bronchitis. no fever. he has taken mucinex.   Past History:  Family History: Last updated: 10/05/2010 mother CA and heart dz  Social History: Last updated: 10/05/2010 Never Smoked Alcohol use-yes 3 times per year Drug use-no  Risk Factors: Smoking Status: never (10/05/2010)  Past Medical History: Reviewed history from 01/23/2011 and no changes required. seasonal allergies bronchitis pneumonia  Past Surgical History: Reviewed history from 10/05/2010 and no changes required. surgery on both  knees back surgery shoulder surgery ear surgery Cholecystectomy  Family History: Reviewed history from 10/05/2010 and no changes required. mother CA and heart dz  Social History: Reviewed history from 10/05/2010 and no changes required. Never Smoked Alcohol use-yes 3 times per year Drug use-no Physical Exam General appearance: well developed, well nourished, no acute distress Head: normocephalic, atraumatic Ears: normal, no lesions or deformities Nasal: swollen red turbinates with congestion Oral/Pharynx: pharyngeal erythema without exudate, uvula midline without deviation Neck: neck supple,  trachea midline, no masses Chest/Lungs: no rales, wheezes, or rhonchi bilateral, breath sounds equal without effort Heart: regular rate and  rhythm, no murmur Skin: no obvious rashes or lesions MSE: oriented to time, place, and person Assessment Problems:   Hx of ASTHMA, MILD (ICD-493.90) New Problems: BRONCHITIS, ACUTE (ICD-466.0) ACUTE SINUSITIS, UNSPECIFIED (ICD-461.9) COUGH (ICD-786.2)   Patient Education: Patient and/or caregiver instructed in the following: rest fluids and Tylenol. use allegra -D you have at home  Plan New Medications/Changes: Sandria Senter ER 8-10 MG/5ML LQCR (CHLORPHENIRAMINE-HYDROCODONE) 1 tsp by mouth q day  #4 fl oz x 0, 04/12/2011, Hassan Rowan MD FLONASE 50 MCG/ACT SUSP (FLUTICASONE PROPIONATE) 2 puffs each nostril q day  #1 x 0, 04/12/2011, Hassan Rowan MD AUGMENTIN [phone] MG TABS (AMOXICILLIN-POT CLAVULANATE) 1 by mouth 2 times daily  #20 x 0, 04/12/2011, Hassan Rowan MD  New Orders: Est. Patient Level III [86578] Follow Up: Follow up in 2-3 days if no improvement, Follow up on an as needed basis, Follow up with Primary Physician  The patient and/or caregiver has been counseled thoroughly with regard to medications prescribed including dosage, schedule, interactions, rationale for use, and possible side effects and they verbalize  understanding.  Diagnoses and expected course of recovery discussed and will return if not improved as expected or if the condition worsens. Patient and/or caregiver verbalized understanding.  Prescriptions: Sandria Senter ER 8-10 MG/5ML LQCR (CHLORPHENIRAMINE-HYDROCODONE) 1 tsp by mouth q day  #4 fl oz x 0   Entered and Authorized by:   Hassan Rowan MD   Signed by:   Hassan Rowan MD on 04/12/2011   Method used:   Print then Give to Patient   RxID:   [id number] FLONASE 50 MCG/ACT SUSP (FLUTICASONE PROPIONATE) 2 puffs each nostril q day  #1 x 0   Entered and Authorized by:   Hassan Rowan MD   Signed by:   Hassan Rowan MD on 04/12/2011   Method used:   Print then Give to Patient   RxID:   [id number] AUGMENTIN 875-125 MG TABS (AMOXICILLIN-POT CLAVULANATE) 1 by mouth 2 times daily  #20 x 0   Entered and Authorized by:   Hassan Rowan MD   Signed by:   Hassan Rowan MD on 04/12/2011   Method used:   Print then Give to Patient   RxID:   [id number]   Patient Instructions: 1)  Please schedule a follow-up appointment as needed. 2)  Please  schedule an appointment with your primary doctor in :3-10 days if neeeded 3)  Take your antibiotic as prescribed until ALL of it is gone, but stop if you develop a rash or swelling and contact our office as soon as possible. 4)  Acute sinusitis symptoms for less than 10 days are not helped by antibiotics.Use warm moist compresses, and over the counter decongestants ( only as directed). Call if no improvement in 5-7 days, sooner if increasing pain, fever, or new symptoms. 5)  Acute bronchitis symptoms for less than 10 days are not helped by antibiotics. take over the counter cough medications. call if no improvment in  5-7 days, sooner if increasing cough, fever, or new symptoms( shortness of breath, chest pain).  Orders Added: 1)  Est. Patient Level III [28413]

## 2012-01-10 ENCOUNTER — Encounter: Payer: Self-pay | Admitting: *Deleted

## 2012-01-10 ENCOUNTER — Emergency Department
Admission: EM | Admit: 2012-01-10 | Discharge: 2012-01-10 | Disposition: A | Discharge status: Home / Self Care | Payer: 59 | Source: Home / Self Care | Attending: Family Medicine | Admitting: Family Medicine

## 2012-01-10 DIAGNOSIS — J3089 Other allergic rhinitis: Secondary | ICD-10-CM

## 2012-01-10 DIAGNOSIS — J309 Allergic rhinitis, unspecified: Secondary | ICD-10-CM

## 2012-01-10 DIAGNOSIS — H6590 Unspecified nonsuppurative otitis media, unspecified ear: Secondary | ICD-10-CM

## 2012-01-10 DIAGNOSIS — H6591 Unspecified nonsuppurative otitis media, right ear: Secondary | ICD-10-CM

## 2012-01-10 HISTORY — DX: Other allergy status, other than to drugs and biological substances: Z91.09

## 2012-01-10 MED ORDER — PREDNISONE 10 MG PO TABS: ORAL_TABLET | ORAL | Status: DC

## 2012-01-10 MED ORDER — METHYLPREDNISOLONE ACETATE 80 MG/ML IJ SUSP
80.0000 mg | Freq: Once | INTRAMUSCULAR | Status: AC
  Administered 2012-01-10: 80 mg via INTRAMUSCULAR

## 2012-01-10 MED ORDER — AMOXICILLIN 875 MG PO TABS: 875.0000 mg | ORAL_TABLET | Freq: Two times a day (BID) | ORAL | Status: AC

## 2012-01-10 NOTE — ED Notes (Signed)
Pt c/o itchy/watery eyes, runny nose and night time cough x 2 wks. He has taken OTC allergy meds, singulair and different samples he had of nasal sprays. He has a allergy specialist that he sees however he states that he could not get an appt for 1 mth.

## 2012-01-10 NOTE — Discharge Instructions (Signed)
Continue present medications.  Continue Saline nasal spray. Begin amoxicillin if fever develops, earache, increasing facial pain, etc.

## 2012-01-10 NOTE — ED Provider Notes (Signed)
History     CSN: [id number]  Arrival date & time 01/10/12  4098   First MD Initiated Contact with Patient 01/10/12 1931      Chief Complaint  Patient presents with  . Allergies  . Cough     HPI Comments: Patient has a history of seasonal rhinitis, now worse for 2 weeks.  His right ear began to feel clogged two days ago.  He has developed a mild cough at night.  No fevers, chills, and sweats    The history is provided by the patient.    Past Medical History  Diagnosis Date  . Environmental allergies     Past Surgical History  Procedure Date  . Knee surgery   . Cholecystectomy   . Back surgery   . Shoulder surgery     Family History  Problem Relation Age of Onset  . Sudden death Mother     History  Substance Use Topics  . Smoking status: Never Smoker   . Smokeless tobacco: Current User  . Alcohol Use: No      Review of Systems No sore throat + nocturnal cough No pleuritic pain No wheezing + nasal congestion ? post-nasal drainage No sinus pain/pressure No itchy/red eyes ? Earache; right ear feels clogged No hemoptysis No SOB No fever/chills No nausea No vomiting No abdominal pain No diarrhea No urinary symptoms No skin rashes + fatigue No myalgias No headache Used OTC meds without relief  Allergies  Review of patient's allergies indicates no known allergies.  Home Medications   Current Outpatient Rx  Name Route Sig Dispense Refill  . ALLERGY PO Oral Take by mouth.    Marland Kitchen MONTELUKAST SODIUM 10 MG PO TABS Oral Take 10 mg by mouth at bedtime.    . AMOXICILLIN 875 MG PO TABS Oral Take 1 tablet (875 mg total) by mouth 2 (two) times daily. (Rx void after 01/20/12) 20 tablet 0  . PREDNISONE 10 MG PO TABS  Take 2 tabs by mouth twice daily for three days, then one tab twice daily for 2 days, then 1 tab daily for two days.  Take PC.  Start Wednesday 18 tablet 0    BP 118/79  Pulse 93  Temp(Src) 98.4 F (36.9 C) (Oral)  Resp 18  Ht 5\' 6"  (1.676 m)   Wt 189 lb 8 oz (85.957 kg)  BMI 30.59 kg/m2  SpO2 96%  Physical Exam Nursing notes and Vital Signs reviewed. Appearance:  Patient appears stated age, and in no acute distress.  Patient is obese (BMI 30.7) Eyes:  Pupils are equal, round, and reactive to light and accomodation.  Extraocular movement is intact.  Conjunctivae are not inflamed  Ears:  Canals normal.  Right tympanic membrane has serous effusion.  Left tympanic membrane normal.  Nose:  Moderately congested turbinates, worse on the right.  No sinus tenderness.  Pharynx:  Normal Neck:  Supple.  Slightly tender shotty posterior nodes are palpated bilaterally  Lungs:  Clear to auscultation.  Breath sounds are equal.  Heart:  Regular rate and rhythm without murmurs, rubs, or gallops.  Abdomen:  Nontender without masses or hepatosplenomegaly.  Bowel sounds are present.  No CVA or flank tenderness.  Extremities:  No edema.  No calf tenderness Skin:  No rash present.   ED Course  Procedures none      1. Perennial allergic rhinitis   2. Right serous otitis media       MDM  Begin prednisone taper. Continue present medications.  Continue Saline nasal spray. Begin amoxicillin if fever develops, earache, increasing facial pain, etc (Given a prescription to hold, with an expiration date)  Followup with ENT as scheduled        Lattie Haw, MD 01/11/12 1416

## 2012-01-13 ENCOUNTER — Telehealth: Payer: Self-pay | Admitting: Emergency Medicine

## 2012-09-28 ENCOUNTER — Emergency Department (INDEPENDENT_AMBULATORY_CARE_PROVIDER_SITE_OTHER): Payer: 59

## 2012-09-28 ENCOUNTER — Emergency Department
Admission: EM | Admit: 2012-09-28 | Discharge: 2012-09-28 | Disposition: A | Discharge status: Home / Self Care | Payer: 59 | Source: Home / Self Care | Attending: Family Medicine | Admitting: Family Medicine

## 2012-09-28 DIAGNOSIS — R209 Unspecified disturbances of skin sensation: Secondary | ICD-10-CM

## 2012-09-28 DIAGNOSIS — S6990XA Unspecified injury of unspecified wrist, hand and finger(s), initial encounter: Secondary | ICD-10-CM

## 2012-09-28 DIAGNOSIS — M79609 Pain in unspecified limb: Secondary | ICD-10-CM

## 2012-09-28 DIAGNOSIS — W19XXXA Unspecified fall, initial encounter: Secondary | ICD-10-CM

## 2012-09-28 DIAGNOSIS — S60221A Contusion of right hand, initial encounter: Secondary | ICD-10-CM

## 2012-09-28 DIAGNOSIS — S60229A Contusion of unspecified hand, initial encounter: Secondary | ICD-10-CM

## 2012-09-28 NOTE — ED Notes (Signed)
Allen Fisher injured his right hand on Monday when he fell on the pavement. The area is swollen and bruised. The swelling is better. He applied ice and took ibuprofen with some relief.

## 2012-09-28 NOTE — ED Provider Notes (Signed)
History     CSN: [id number]  Arrival date & time 09/28/12  1046   First MD Initiated Contact with Patient 09/28/12 1119      Chief Complaint  Patient presents with  . Hand Injury    4 days ago       HPI Comments: Patient fell five days ago, injuring his right hand.  The initial swelling has decreased, but he still has mild pain   Patient is a 37 y.o. male presenting with hand injury. The history is provided by the patient.  Hand Injury Location:  Hand Time since incident:  5 days Injury: yes   Mechanism of injury: fall   Fall:    Fall occurred:  Tripped   Impact surface: pavement.   Point of impact:  Hands   Entrapped after fall: no   Hand location:  R hand Pain details:    Quality:  Aching   Radiates to:  Does not radiate   Severity:  Mild   Onset quality:  Sudden   Progression:  Partially resolved Dislocation: no   Foreign body present:  No foreign bodies Relieved by:  Ice and NSAIDs Exacerbated by: grasping.   Past Medical History  Diagnosis Date  . Environmental allergies     Past Surgical History  Procedure Laterality Date  . Knee surgery    . Cholecystectomy    . Back surgery    . Shoulder surgery      Family History  Problem Relation Age of Onset  . Sudden death Mother     History  Substance Use Topics  . Smoking status: Never Smoker   . Smokeless tobacco: Current User  . Alcohol Use: No      Review of Systems  All other systems reviewed and are negative.    Allergies  Review of patient's allergies indicates no known allergies.  Home Medications   Current Outpatient Rx  Name  Route  Sig  Dispense  Refill  . colesevelam (WELCHOL) 625 MG tablet   Oral   Take 1,250 mg by mouth 2 (two) times daily with a meal.         . Chlorpheniramine Maleate (ALLERGY PO)   Oral   Take by mouth.         . montelukast (SINGULAIR) 10 MG tablet   Oral   Take 10 mg by mouth at bedtime.         . predniSONE (DELTASONE) 10 MG tablet        Take 2 tabs by mouth twice daily for three days, then one tab twice daily for 2 days, then 1 tab daily for two days.  Take PC.  Start Wednesday   18 tablet   0     BP 129/86  Pulse 82  Temp(Src) 97.7 F (36.5 C) (Oral)  Ht 5\' 7"  (1.702 m)  Wt 186 lb (84.369 kg)  BMI 29.12 kg/m2  SpO2 96%  Physical Exam  Nursing note and vitals reviewed. Constitutional: He is oriented to person, place, and time. He appears well-developed and well-nourished. No distress.  HENT:  Head: Atraumatic.  Eyes: Conjunctivae are normal. Pupils are equal, round, and reactive to light.  Musculoskeletal:       Right hand: He exhibits tenderness and bony tenderness. He exhibits normal range of motion, normal two-point discrimination, normal capillary refill, no deformity, no laceration and no swelling. Normal sensation noted. Normal strength noted.       Hands: There is tenderness over the right  5th metacarpal extending to the ulnar aspect of wrist.  Wrist has full range of motion.  Neurological: He is alert and oriented to person, place, and time.  Skin: Skin is warm and dry.    ED Course  Procedures  none   Dg Wrist Complete Right  09/28/2012  *RADIOLOGY REPORT*  Clinical Data: Hand pain after injury.  RIGHT WRIST - COMPLETE 3+ VIEW  Comparison: None.  Findings: No fracture or dislocation is noted.  Joint spaces are intact. No soft tissue abnormality is noted.  IMPRESSION: Normal right wrist.   Original Report Authenticated By: Lupita Raider.,  M.D.    Dg Hand Complete Right  09/28/2012  *RADIOLOGY REPORT*  Clinical Data: Injured hand 4 days ago with tenderness  RIGHT HAND - COMPLETE 3+ VIEW  Comparison: None.  Findings: The radiocarpal joint space appears normal.  The ulnar styloid is intact.  The carpal bones are in normal position.  MCP, PIP, and DIP joints appear normal.  No fracture is seen.  IMPRESSION: Negative.   Original Report Authenticated By: Dwyane Dee, M.D.      1. Contusion of right  hand       MDM  Ace wrap applied.   Wear ace wrap until swelling decreases.  Apply ice pack 2 or 3 times daily.  May take Ibuprofen 200mg , 4 tabs every 8 hours with food until swelling decreases. Followup with Dr. Rodney Langton if not improved 2 weeks.        Lattie Haw, MD 09/30/12 [phone]

## 2012-10-13 ENCOUNTER — Emergency Department
Admission: EM | Admit: 2012-10-13 | Discharge: 2012-10-13 | Disposition: A | Discharge status: Home / Self Care | Payer: 59 | Source: Home / Self Care | Attending: Family Medicine | Admitting: Family Medicine

## 2012-10-13 DIAGNOSIS — J45909 Unspecified asthma, uncomplicated: Secondary | ICD-10-CM

## 2012-10-13 MED ORDER — PREDNISONE 20 MG PO TABS: 20.0000 mg | ORAL_TABLET | Freq: Two times a day (BID) | ORAL | Status: DC

## 2012-10-13 MED ORDER — DOXYCYCLINE HYCLATE 100 MG PO CAPS: 100.0000 mg | ORAL_CAPSULE | Freq: Two times a day (BID) | ORAL | Status: DC

## 2012-10-13 MED ORDER — METHYLPREDNISOLONE SODIUM SUCC 125 MG IJ SOLR
125.0000 mg | Freq: Once | INTRAMUSCULAR | Status: AC
  Administered 2012-10-13: 125 mg via INTRAMUSCULAR

## 2012-10-13 MED ORDER — BENZONATATE 200 MG PO CAPS: 200.0000 mg | ORAL_CAPSULE | Freq: Every day | ORAL | Status: DC

## 2012-10-13 NOTE — ED Notes (Signed)
Allen Fisher complains of productive cough for 2 weeks. Cough has been worse the last 3 days. He also complains of wheezing, congestion, sneezing and hoarseness. Denies fever, chills or sweats. He has tried Daytime cold and cough and Nighttime cold and cough with some relief.

## 2012-10-13 NOTE — ED Provider Notes (Signed)
History     CSN: [id number]  Arrival date & time 10/13/12  1649   First MD Initiated Contact with Patient 10/13/12 1718      Chief Complaint  Patient presents with  . Cough    x 2 weeks -  worse x 3 days       HPI Comments: Patient developed a mild URI about 2 weeks ago with sore throat, sinus congestion, and cough.  The cough has persisted and over the past two days he has developed wheezing and increased chest congestion.  No pleuritic pain or fever.  He has had pneumonia in the past.  He has a history of seasonal and food allergies.  The history is provided by the patient.    Past Medical History  Diagnosis Date  . Environmental allergies     Past Surgical History  Procedure Laterality Date  . Knee surgery    . Cholecystectomy    . Back surgery    . Shoulder surgery      Family History  Problem Relation Age of Onset  . Sudden death Mother     History  Substance Use Topics  . Smoking status: Never Smoker   . Smokeless tobacco: Current User  . Alcohol Use: No      Review of Systems + sore throat + cough No pleuritic pain + wheezing + nasal congestion + post-nasal drainage No sinus pain/pressure No itchy/red eyes No earache No hemoptysis No SOB No fever/chills No nausea No vomiting No abdominal pain No diarrhea No urinary symptoms No skin rashes + fatigue No myalgias No headache Used OTC meds without relief  Allergies  Review of patient's allergies indicates no known allergies.  Home Medications   Current Outpatient Rx  Name  Route  Sig  Dispense  Refill  . Acetaminophen-DM (DAYTIME COLD MEDICINE PO)   Oral   Take by mouth.         . colesevelam (WELCHOL) 625 MG tablet   Oral   Take 1,250 mg by mouth 2 (two) times daily with a meal.         . Pseudoeph-CPM-DM-APAP (NIGHTTIME COLD/COUGH PO)   Oral   Take by mouth.         . benzonatate (TESSALON) 200 MG capsule   Oral   Take 1 capsule (200 mg total) by mouth at bedtime.  12 capsule   0   . Chlorpheniramine Maleate (ALLERGY PO)   Oral   Take by mouth.         . doxycycline (VIBRAMYCIN) 100 MG capsule   Oral   Take 1 capsule (100 mg total) by mouth 2 (two) times daily.   20 capsule   0   . montelukast (SINGULAIR) 10 MG tablet   Oral   Take 10 mg by mouth at bedtime.         . predniSONE (DELTASONE) 20 MG tablet   Oral   Take 1 tablet (20 mg total) by mouth 2 (two) times daily. Take with food.   10 tablet   0     BP 163/80  Pulse 70  Temp(Src) 97.6 F (36.4 C) (Oral)  Ht 5\' 6"  (1.676 m)  Wt 183 lb (83.008 kg)  BMI 29.55 kg/m2  SpO2 98%  Physical Exam Nursing notes and Vital Signs reviewed. Appearance:  Patient appears healthy, stated age, and in no acute distress Eyes:  Pupils are equal, round, and reactive to light and accomodation.  Extraocular movement is intact.  Conjunctivae  are not inflamed  Ears:  Canals normal.  Tympanic membranes normal.  Nose:  Mildly congested turbinates.  No sinus tenderness.    Pharynx:  Normal Neck:  Supple.  Slightly tender shotty posterior nodes are palpated bilaterally  Lungs:  Generally clear to auscultation; rare wheeze.  Breath sounds are equal.  Heart:  Regular rate and rhythm without murmurs, rubs, or gallops.  Abdomen:  Nontender without masses or hepatosplenomegaly.  Bowel sounds are present.  No CVA or flank tenderness.  Extremities:  No edema.  No calf tenderness Skin:  No rash present.   ED Course  Procedures  none      1. Asthmatic bronchitis       MDM  Begin doxycycline.  SoluMedrol 125mg  IM.  Begin prednisone burst tomorrow. Prescription written for Benzonatate [facility]) to take at bedtime for night-time cough.  Continue plain Mucinex (guaifenesin) twice daily for cough and congestion.  Increase fluid intake, rest. May use Afrin nasal spray (or generic oxymetazoline) twice daily for about 5 days.  Also recommend using saline nasal spray several times daily and saline nasal  irrigation (AYR is a common brand) Stop all antihistamines for now, and other non-prescription cough/cold preparations. Begin Prednisone on Sunday 10/14/12.  Follow-up with family doctor if not improving 1 week.        Lattie Haw, MD 10/15/12 [phone]

## 2013-07-30 ENCOUNTER — Emergency Department
Admission: EM | Admit: 2013-07-30 | Discharge: 2013-07-30 | Disposition: A | Discharge status: Home / Self Care | Payer: 59 | Source: Home / Self Care | Attending: Emergency Medicine | Admitting: Emergency Medicine

## 2013-07-30 ENCOUNTER — Encounter: Payer: Self-pay | Admitting: Emergency Medicine

## 2013-07-30 DIAGNOSIS — J209 Acute bronchitis, unspecified: Secondary | ICD-10-CM

## 2013-07-30 DIAGNOSIS — J45909 Unspecified asthma, uncomplicated: Secondary | ICD-10-CM

## 2013-07-30 DIAGNOSIS — R05 Cough: Secondary | ICD-10-CM

## 2013-07-30 DIAGNOSIS — R059 Cough, unspecified: Secondary | ICD-10-CM

## 2013-07-30 HISTORY — DX: Pneumonia, unspecified organism: J18.9

## 2013-07-30 MED ORDER — BENZONATATE 100 MG PO CAPS: 100.0000 mg | ORAL_CAPSULE | Freq: Three times a day (TID) | ORAL | Status: DC

## 2013-07-30 MED ORDER — METHYLPREDNISOLONE SODIUM SUCC 125 MG IJ SOLR
125.0000 mg | INTRAMUSCULAR | Status: AC
  Administered 2013-07-30: 125 mg via INTRAMUSCULAR

## 2013-07-30 MED ORDER — DOXYCYCLINE HYCLATE 100 MG PO CAPS: 100.0000 mg | ORAL_CAPSULE | Freq: Two times a day (BID) | ORAL | Status: DC

## 2013-07-30 MED ORDER — PREDNISONE 20 MG PO TABS: 20.0000 mg | ORAL_TABLET | Freq: Two times a day (BID) | ORAL | Status: DC

## 2013-07-30 NOTE — ED Provider Notes (Signed)
CSN: [id number]     Arrival date & time 07/30/13  1140 History   First MD Initiated Contact with Patient 07/30/13 1159     Chief Complaint  Patient presents with  . Cough   (Consider location/radiation/quality/duration/timing/severity/associated sxs/prior Treatment) HPI URI HISTORY  Allen Fisher is a 37 y.o. male who complains of onset of cough and cold symptoms for 4 days.  Have been using over-the-counter treatment which helps a little bit.  History of occasional bronchitis and even had pneumonia in the past. He states that Z-Pak have not been effective, but doxycycline and steroids were effective last time he had this in March of this year.  No chills/sweats +  Mild Fever  +  Mild Nasal congestion +  Minimal Discolored Post-nasal drainage No sinus pain/pressure No sore throat  +  Severe, hacking cough, which keeps him up at night Positive wheezing Positive chest congestion No hemoptysis No shortness of breath No pleuritic pain  No itchy/red eyes No earache  No nausea No vomiting No abdominal pain No diarrhea  No skin rashes +  Fatigue No myalgias No headache  Past Medical History  Diagnosis Date  . Environmental allergies   . Pneumonia    Past Surgical History  Procedure Laterality Date  . Knee surgery    . Cholecystectomy    . Back surgery    . Shoulder surgery     Family History  Problem Relation Age of Onset  . Sudden death Mother   . Cancer Mother    History  Substance Use Topics  . Smoking status: Never Smoker   . Smokeless tobacco: Current User    Types: Snuff  . Alcohol Use: No    Review of Systems  All other systems reviewed and are negative.    Allergies  Review of patient's allergies indicates no known allergies.  Home Medications   Current Outpatient Rx  Name  Route  Sig  Dispense  Refill  . colesevelam (WELCHOL) 625 MG tablet   Oral   Take 1,250 mg by mouth 2 (two) times daily with a meal.         . Multiple  Vitamins-Minerals (MULTIVITAMIN PO)   Oral   Take by mouth.         . benzonatate (TESSALON) 100 MG capsule   Oral   Take 1 capsule (100 mg total) by mouth every 8 (eight) hours. As needed for cough   21 capsule   0   . doxycycline (VIBRAMYCIN) 100 MG capsule   Oral   Take 1 capsule (100 mg total) by mouth 2 (two) times daily.   20 capsule   0   . montelukast (SINGULAIR) 10 MG tablet   Oral   Take 10 mg by mouth at bedtime.         . predniSONE (DELTASONE) 20 MG tablet   Oral   Take 1 tablet (20 mg total) by mouth 2 (two) times daily.   10 tablet   0    BP 116/80  Pulse 68  Temp(Src) 98.1 F (36.7 C) (Oral)  Resp 16  Wt 183 lb (83.008 kg)  SpO2 98% Physical Exam  Nursing note and vitals reviewed. Constitutional: He is oriented to person, place, and time. He appears well-developed and well-nourished. No distress.  HENT:  Head: Normocephalic and atraumatic.  Right Ear: Tympanic membrane normal.  Left Ear: Tympanic membrane normal.  Nose: Nose normal.  Mouth/Throat: Oropharynx is clear and moist. No oropharyngeal exudate.  Eyes: Right eye  exhibits no discharge. Left eye exhibits no discharge. No scleral icterus.  Neck: Neck supple.  Cardiovascular: Normal rate, regular rhythm and normal heart sounds.   Pulmonary/Chest: No respiratory distress. He has wheezes. He has rhonchi. He has no rales.  Lymphadenopathy:    He has no cervical adenopathy.  Neurological: He is alert and oriented to person, place, and time.  Skin: Skin is warm and dry.    ED Course  Procedures (including critical care time) Labs Review Labs Reviewed - No data to display Imaging Review No results found.  EKG Interpretation    Date/Time:    Ventricular Rate:    PR Interval:    QRS Duration:   QT Interval:    QTC Calculation:   R Axis:     Text Interpretation:              MDM   1. Asthmatic bronchitis    Discussed with patient. Treatment options discussed. Risks,  benefits, alternatives discussed. He declined DuoNeb nebulizer/bronchodilator treatment.  We'll treat as follows: Solu-Medrol 125 mg IM stat Doxycycline 100 mg twice a day x10 days  Tessalon Perles when necessary cough Prednisone 20 mg by mouth twice a day x5 days Other OTC symptomatic care discussed. Followup with PCP if no better 5-7 days, sooner if worse or new symptoms. Precautions discussed. Red flags discussed. Questions invited and answered. Patient voiced understanding and agreement.    Lajean Manes, MD 07/30/13 [phone]

## 2013-07-30 NOTE — ED Notes (Signed)
Daaiel c/o cough, productive at times, congestion, fatigue and body aches x 4 days. HX of bronchitis and PNA. No flu vaccine this year.

## 2013-10-24 LAB — LIPID PANEL
Cholesterol: 136 mg/dL (ref 0–200)
HDL: 38 mg/dL (ref 35–70)
LDL Cholesterol: 81 mg/dL
TRIGLYCERIDES: 83 mg/dL (ref 40–160)

## 2013-10-24 LAB — TSH: TSH: 0.92 u[IU]/mL (ref 0.41–5.90)

## 2013-11-05 LAB — CBC AND DIFFERENTIAL
Hemoglobin: 17.1 g/dL (ref 13.5–17.5)
Platelets: 166 10*3/uL (ref 150–399)
WBC: 4.6 10^3/mL

## 2013-11-05 LAB — HEPATIC FUNCTION PANEL
ALK PHOS: 38 U/L (ref 25–125)
ALT: 52 U/L — AB (ref 10–40)
AST: 22 U/L (ref 14–40)
Bilirubin, Total: 2.5 mg/dL

## 2013-11-05 LAB — BASIC METABOLIC PANEL
Creatinine: 0.9 mg/dL (ref 0.6–1.3)
GLUCOSE: 83 mg/dL

## 2013-11-06 LAB — CHG TESTOSTERONE RESPONSE: Testosterone: 214

## 2014-01-09 ENCOUNTER — Encounter: Payer: Self-pay | Admitting: Emergency Medicine

## 2014-01-09 ENCOUNTER — Emergency Department
Admission: EM | Admit: 2014-01-09 | Discharge: 2014-01-09 | Disposition: A | Discharge status: Home / Self Care | Payer: 59 | Source: Home / Self Care | Attending: Emergency Medicine | Admitting: Emergency Medicine

## 2014-01-09 DIAGNOSIS — H6500 Acute serous otitis media, unspecified ear: Secondary | ICD-10-CM

## 2014-01-09 DIAGNOSIS — Z9109 Other allergy status, other than to drugs and biological substances: Secondary | ICD-10-CM

## 2014-01-09 DIAGNOSIS — J309 Allergic rhinitis, unspecified: Secondary | ICD-10-CM

## 2014-01-09 DIAGNOSIS — T7840XA Allergy, unspecified, initial encounter: Secondary | ICD-10-CM

## 2014-01-09 MED ORDER — PREDNISONE (PAK) 10 MG PO TABS
ORAL_TABLET | ORAL | Status: DC
Start: 2014-01-09 — End: 2014-06-05

## 2014-01-09 MED ORDER — METHYLPREDNISOLONE ACETATE 80 MG/ML IJ SUSP
80.0000 mg | Freq: Once | INTRAMUSCULAR | Status: AC
  Administered 2014-01-09: 80 mg via INTRAMUSCULAR

## 2014-01-09 NOTE — ED Provider Notes (Signed)
CSN: [id number]     Arrival date & time 01/09/14  0935 History   First MD Initiated Contact with Patient 01/09/14 [phone]     Chief Complaint  Patient presents with  . Allergies   (Consider location/radiation/quality/duration/timing/severity/associated sxs/prior Treatment) HPI Sneezing, ears clogged, chest congestion x 3 days. Sees an allergist but can't get appt until next week for injection.  URI/Allergy HISTORY  Allen Fisher is a 38 y.o. male who complains of onset of severe allergy symptoms for 3 days.  Progressively worsening. Under the care of an allergist who prescribes allergy injections. Also faithfully taking Claritin and/or Zyrtec and Flonase, but that's not helping significantly. In the past, his seasonal allergies have been so severe, he's needed a "cortisone shot" and a course of oral prednisone.   No chills/sweats No Fever  +  Nasal congestion, + sneezing and itchy watery eyes No  Discolored Post-nasal drainage No sinus pain/pressure No sore throat, except itching and irritation in back of throat.  +  Occasional, nonproductive cough No wheezing No chest congestion No hemoptysis No shortness of breath No pleuritic pain  + itchy/red eyes + earache  No nausea No vomiting No abdominal pain No diarrhea  No skin rashes +  Fatigue No myalgias No headache   Past Medical History  Diagnosis Date  . Environmental allergies   . Pneumonia    Past Surgical History  Procedure Laterality Date  . Knee surgery    . Cholecystectomy    . Back surgery    . Shoulder surgery     Family History  Problem Relation Age of Onset  . Sudden death Mother   . Cancer Mother    History  Substance Use Topics  . Smoking status: Never Smoker   . Smokeless tobacco: Current User    Types: Snuff  . Alcohol Use: No    Review of Systems  All other systems reviewed and are negative.   Allergies  Review of patient's allergies indicates no known allergies.  Home Medications    Prior to Admission medications   Medication Sig Start Date End Date Taking? Authorizing Provider  loratadine (CLARITIN) 10 MG tablet Take 10 mg by mouth daily.   Yes Historical Provider, MD  colesevelam (WELCHOL) 625 MG tablet Take 1,250 mg by mouth 2 (two) times daily with a meal.    Historical Provider, MD  Multiple Vitamins-Minerals (MULTIVITAMIN PO) Take by mouth.    Historical Provider, MD  predniSONE (STERAPRED UNI-PAK) 10 MG tablet Take as directed for 6 days.--Take 6 on day 1, 5 on day 2, 4 on day 3, then 3 tablets on day 4, then 2 tablets on day 5, then 1 on day 6. 01/09/14   Lajean Manes, MD   BP 138/93  Pulse 87  Temp(Src) 97.9 F (36.6 C) (Oral)  Ht 5\' 6"  (1.676 m)  Wt 206 lb (93.441 kg)  BMI 33.27 kg/m2  SpO2 96% Physical Exam  Nursing note and vitals reviewed. Constitutional: He is oriented to person, place, and time. He appears well-developed and well-nourished. No distress.  Appears very uncomfortable from sinus congestion and sneezing. Very nasal voice  HENT:  Head: Normocephalic and atraumatic.  Right Ear: External ear and ear canal normal. Tympanic membrane is not perforated and not erythematous. A middle ear effusion is present.  Left Ear: External ear and ear canal normal. Tympanic membrane is not perforated and not erythematous. A middle ear effusion is present.  Nose: Mucosal edema and rhinorrhea present. Right sinus exhibits no maxillary  sinus tenderness. Left sinus exhibits no maxillary sinus tenderness.  Mouth/Throat: Oropharynx is clear and moist. No oral lesions. No oropharyngeal exudate.  Ears: TMs normal bilaterally, except mild serous fluid behind TM.  Oropharynx: Minimal injection. Airway intact. No swelling. No exudate.  Eyes: Right eye exhibits no discharge. Left eye exhibits no discharge. Right conjunctiva is injected. Left conjunctiva is injected. No scleral icterus.  No hemorrhage or exudate.  Neck: Neck supple.  Cardiovascular: Normal rate,  regular rhythm and normal heart sounds.   Pulmonary/Chest: Effort normal and breath sounds normal. He has no wheezes. He has no rales.  Lymphadenopathy:    He has no cervical adenopathy.  Neurological: He is alert and oriented to person, place, and time.  Skin: Skin is warm and dry.    ED Course  Procedures (including critical care time) Labs Review Labs Reviewed - No data to display  Imaging Review No results found.   MDM   1. Allergic reaction   2. Multiple environmental allergies    Treatment options discussed, as well as risks, benefits, alternatives. Patient voiced understanding and agreement with the following plans: Depo-Medrol 80 mg IM stat. Starting tomorrow, prednisone 10 mg-6 day Dosepak He declined Benadryl injection and prefers to continue with his other antihistamines . Continue Flonase and allergy eyedrops as prescribed by allergist Other symptomatic care discussed. In my opinion, there is no sign of bacterial infection, so antibiotic not indicated.  Follow-up with your allergist primary care doctor in 5-7 days if not improving, or sooner if symptoms become worse. Precautions discussed. Red flags discussed. Questions invited and answered. Patient voiced understanding and agreement.     Lajean Manesavid Massey, MD 01/09/14 1038

## 2014-01-09 NOTE — ED Notes (Signed)
Sneezing, ears clogged, chest congestion x 3 days. Sees an allergist but can't get appt until next week for injection.

## 2014-06-05 ENCOUNTER — Encounter: Payer: Self-pay | Admitting: Family Medicine

## 2014-06-05 ENCOUNTER — Other Ambulatory Visit: Payer: Self-pay | Admitting: Family Medicine

## 2014-06-05 DIAGNOSIS — R7989 Other specified abnormal findings of blood chemistry: Secondary | ICD-10-CM | POA: Insufficient documentation

## 2014-06-05 DIAGNOSIS — K589 Irritable bowel syndrome without diarrhea: Secondary | ICD-10-CM | POA: Insufficient documentation

## 2014-06-05 DIAGNOSIS — R945 Abnormal results of liver function studies: Secondary | ICD-10-CM

## 2014-06-05 DIAGNOSIS — E669 Obesity, unspecified: Secondary | ICD-10-CM | POA: Insufficient documentation

## 2014-06-05 DIAGNOSIS — E291 Testicular hypofunction: Secondary | ICD-10-CM | POA: Insufficient documentation

## 2014-06-05 DIAGNOSIS — E6609 Other obesity due to excess calories: Secondary | ICD-10-CM | POA: Insufficient documentation

## 2014-06-20 ENCOUNTER — Ambulatory Visit: Payer: 59 | Admitting: Family Medicine

## 2014-06-25 ENCOUNTER — Emergency Department
Admission: EM | Admit: 2014-06-25 | Discharge: 2014-06-25 | Disposition: A | Discharge status: Home / Self Care | Payer: 59 | Source: Home / Self Care | Attending: Family Medicine | Admitting: Family Medicine

## 2014-06-25 ENCOUNTER — Encounter: Payer: Self-pay | Admitting: *Deleted

## 2014-06-25 DIAGNOSIS — R519 Headache, unspecified: Secondary | ICD-10-CM

## 2014-06-25 DIAGNOSIS — H53452 Other localized visual field defect, left eye: Secondary | ICD-10-CM

## 2014-06-25 DIAGNOSIS — H53412 Scotoma involving central area, left eye: Secondary | ICD-10-CM

## 2014-06-25 DIAGNOSIS — R51 Headache: Secondary | ICD-10-CM

## 2014-06-25 NOTE — ED Provider Notes (Signed)
CSN: [id number]     Arrival date & time 06/25/14  [id number] History   First MD Initiated Contact with Patient 06/25/14 1012     Chief Complaint  Patient presents with  . Hypertension      HPI Comments: While walking outside yesterday, patient suddenly noticed left lateral blurred vision with flashing scotomata.  The eye symptoms resolved and he then developed a right side headache that lasted about 6 hours.  He checked his BP while his symptoms were present:  158/98, dropping to 133/95.  He is now completely assymptomatic.  No past history of migraines, and no family history of migraines.  Patient is a 38 y.o. male presenting with eye problem. The history is provided by the patient.  Eye Problem Location:  L eye Quality: blurred left lateral vision. Severity:  Mild Duration:  1 hour Timing:  Constant Progression:  Resolved Chronicity:  New Relieved by:  None tried Ineffective treatments:  None tried Associated symptoms: blurred vision, headaches and scotomas   Associated symptoms: no crusting, no decreased vision, no discharge, no double vision, no inflammation, no nausea, no photophobia, no redness, no swelling, no tearing, no vomiting and no weakness     Past Medical History  Diagnosis Date  . Environmental allergies   . Pneumonia    Past Surgical History  Procedure Laterality Date  . Knee surgery    . Cholecystectomy    . Back surgery    . Shoulder surgery     Family History  Problem Relation Age of Onset  . Cancer Mother     uterine  . Arrhythmia Mother    History  Substance Use Topics  . Smoking status: Never Smoker   . Smokeless tobacco: Current User    Types: Snuff  . Alcohol Use: No    Review of Systems  Eyes: Positive for blurred vision. Negative for double vision, photophobia, discharge and redness.  Gastrointestinal: Negative for nausea and vomiting.  Neurological: Positive for headaches. Negative for weakness.  All other systems reviewed and are  negative.   Allergies  Review of patient's allergies indicates no known allergies.  Home Medications   Prior to Admission medications   Medication Sig Start Date End Date Taking? Authorizing Provider  Ascorbic Acid (VITAMIN C) 1000 MG tablet Take 1,000 mg by mouth daily.   Yes Historical Provider, MD  colesevelam (WELCHOL) 625 MG tablet Take 1,250 mg by mouth 2 (two) times daily with a meal.    Historical Provider, MD  loratadine (CLARITIN) 10 MG tablet Take 10 mg by mouth daily.    Historical Provider, MD  Multiple Vitamins-Minerals (MULTIVITAMIN PO) Take by mouth.    Historical Provider, MD  testosterone cypionate (DEPO-TESTOSTERONE) 200 MG/ML injection Inject 1.2 mLs (240 mg total) into the muscle every 14 (fourteen) days. 06/05/14   Sean Hommel, DO   BP 128/79 mmHg  Pulse 87  Temp(Src) 98.4 F (36.9 C) (Oral)  Resp 18  Ht 5\' 6"  (1.676 m)  Wt 179 lb (81.194 kg)  BMI 28.91 kg/m2  SpO2 98% Physical Exam Nursing notes and Vital Signs reviewed. Appearance:  Patient appears healthy, stated age, and in no acute distress.  He is alert and oriented.  Eyes:  Pupils are equal, round, and reactive to light and accomodation.  Extraocular movement is intact.  Conjunctivae are not inflamed  Ears:  Canals normal.  Tympanic membranes normal.  Nose:  Normal turbinates.  No sinus tenderness.   Maxillary sinus tenderness is present.  Mouth:  Normal Pharynx:  Normal Neck:  Supple.  No adenopathy.  Carotids have normal upstrokes without bruits. Lungs:  Clear to auscultation.  Breath sounds are equal.  Heart:  Regular rate and rhythm without murmurs, rubs, or gallops.  Abdomen:  Nontender without masses or hepatosplenomegaly.  Bowel sounds are present.  No CVA or flank tenderness.  Extremities:  No edema.  No calf tenderness Skin:  No rash present.  Neurologic:  Cranial nerves 2 through 12 are normal.  Patellar, achilles, and elbow reflexes are normal.  Cerebellar function is intact  (finger-to-nose and rapid alternating hand movement).  Gait and station are normal.  Grip strength symmetric bilaterally.   .   ED Course  Procedures  none     MDM   1. Scotoma, left;  Normal exam reassuring.   2. Right-sided headache; ? Migraine with aura    Suspect that patient's elevation in blood pressure yesterday was a result of his headache. Recommend that he follow-up with ophthalmologist as soon as possible for evaluation of left eye symptoms . Advised to monitor blood pressure; follow-up with PCP    Lattie HawStephen A Beese, MD 06/25/14 2120

## 2014-06-25 NOTE — Discharge Instructions (Signed)
Monitor Blood pressure.  Followup with Dr. Ivan AnchorsHommel as planned

## 2014-06-25 NOTE — ED Notes (Signed)
Pt c/o visual changes, elevated BP, and HA yesterday. [facility] 158/98,133/95,125/91.

## 2014-06-30 ENCOUNTER — Ambulatory Visit: Payer: 59 | Admitting: Family Medicine

## 2014-07-04 ENCOUNTER — Ambulatory Visit (INDEPENDENT_AMBULATORY_CARE_PROVIDER_SITE_OTHER): Payer: 59 | Admitting: Family Medicine

## 2014-07-04 ENCOUNTER — Ambulatory Visit (INDEPENDENT_AMBULATORY_CARE_PROVIDER_SITE_OTHER): Payer: 59 | Admitting: *Deleted

## 2014-07-04 ENCOUNTER — Encounter: Payer: Self-pay | Admitting: Family Medicine

## 2014-07-04 VITALS — BP 132/84 | HR 83 | Wt 173.0 lb

## 2014-07-04 DIAGNOSIS — L700 Acne vulgaris: Secondary | ICD-10-CM

## 2014-07-04 DIAGNOSIS — G43101 Migraine with aura, not intractable, with status migrainosus: Secondary | ICD-10-CM

## 2014-07-04 DIAGNOSIS — R197 Diarrhea, unspecified: Secondary | ICD-10-CM

## 2014-07-04 DIAGNOSIS — Z23 Encounter for immunization: Secondary | ICD-10-CM

## 2014-07-04 DIAGNOSIS — K529 Noninfective gastroenteritis and colitis, unspecified: Secondary | ICD-10-CM | POA: Insufficient documentation

## 2014-07-04 MED ORDER — COLESEVELAM HCL 625 MG PO TABS: 1875.0000 mg | ORAL_TABLET | Freq: Two times a day (BID) | ORAL | Status: DC

## 2014-07-04 MED ORDER — CLINDAMYCIN PHOS-BENZOYL PEROX 1-5 % EX GEL: Freq: Two times a day (BID) | CUTANEOUS | Status: DC

## 2014-07-04 NOTE — Progress Notes (Signed)
CC: Allen Fisher is a 38 y.o. male is here for Establish Care   Subjective: HPI:  Very pleasant 38 rolled here to establish care, [facility]  Patient reports a history of diarrhea that has been present to varying degrees since 2007 when he had his gallbladder removed. Provided he takes at least 3 capsules of welchol every morning he has almost no degree of diarrhea throughout the day unless he eats fatty foods in the evenings. Symptoms will return to a moderate to severe degree if he does not take the above medication. Describes diarrhea as loose has never had blood or melena appearance.   Reports a new diagnosis of migraine that was given to him on the 11th of this month. He was doing nothing out of the ordinary and developed what is described as a the sensation of a flashbulb just went off in his left peripheral vision that was present for at least 30 minutes and then developed into a pounding right-sided temporal headache. Heintentionally fell asleep and after sleeping for 40 minutes symptoms were 100% gone. He did not have any other motor or sensory disturbances prior during or after this event. He has never had this before. He denies any symptoms since then.  He has a history of hypogonadism for which he takes testosterone injections prescribed by his urologist. Over the past few months he's been a little more irregular with taking this has stretched out that regimen beyond the prescribed dose of every 14 days. He never takes it sooner than every 14 days. He began this due to fatigue and lab work reflecting hypogonadism. Fatigue is now 100% gone provided he takes testosterone at least every 21 days.  For the past few months he's noticed worsening acne on the face and back. Upon direct questioning symptoms have developed since he became more regular with his testosterone dosing.   Review of Systems - General ROS: negative for - chills, fever, night sweats, weight gain or  weight loss Ophthalmic ROS: negative for - decreased vision Psychological ROS: negative for - anxiety or depression ENT ROS: negative for - hearing change, nasal congestion, tinnitus or allergies Hematological and Lymphatic ROS: negative for - bleeding problems, bruising or swollen lymph nodes Breast ROS: negative Respiratory ROS: no cough, shortness of breath, or wheezing Cardiovascular ROS: no chest pain or dyspnea on exertion Gastrointestinal ROS: no abdominal pain,  black or bloody stools Genito-Urinary ROS: negative for - genital discharge, genital ulcers, incontinence or abnormal bleeding from genitals Musculoskeletal ROS: negative for - joint pain or muscle pain Neurological ROS: negative for - headaches or memory loss Dermatological ROS: negative for lumps, mole changes, rash and skin lesion changes  Past Medical History  Diagnosis Date  . Environmental allergies   . Pneumonia     Past Surgical History  Procedure Laterality Date  . Knee surgery    . Cholecystectomy    . Back surgery    . Shoulder surgery     Family History  Problem Relation Age of Onset  . Cancer Mother     uterine  . Arrhythmia Mother     History   Social History  . Marital Status: Married    Spouse Name: N/A    Number of Children: N/A  . Years of Education: N/A   Occupational History  . Not on file.   Social History Main Topics  . Smoking status: Never Smoker   . Smokeless tobacco: Current User    Types: Snuff  .  Alcohol Use: No  . Drug Use: No  . Sexual Activity:    Partners: Female   Other Topics Concern  . Not on file   Social History Narrative     Objective: BP 132/84 mmHg  Pulse 83  Wt 173 lb (78.472 kg)  General: Alert and Oriented, No Acute Distress HEENT: Pupils equal, round, reactive to light. Conjunctivae clear.  Moist mucous membranes pharynx unremarkable Lungs: clearing comfortable work of breathing Cardiac: Regular rate and rhythm.  Abdomen: soft  flat Extremities: No peripheral edema.  Strong peripheral pulses.  Mental Status: No depression, anxiety, nor agitation. Skin: Warm and dry. Mild acne on the face and upper backwithout scarring  Assessment & Plan: Allen Fisher was seen today for establish care.  Diagnoses and associated orders for this visit:  Diarrhea - Discontinue: colesevelam (WELCHOL) 625 MG tablet; Take 3 tablets (1,875 mg total) by mouth 2 (two) times daily with a meal. - colesevelam (WELCHOL) 625 MG tablet; Take 3 tablets (1,875 mg total) by mouth 2 (two) times daily with a meal.  Migraine with aura and with status migrainosus, not intractable  Acne vulgaris - clindamycin-benzoyl peroxide (BENZACLIN) gel; Apply topically 2 (two) times daily. To acne spots.  Chronic diarrhea    Diarrhea:controlled, he is well aware that WelChol is being used to bind bile acids and of his diarrhea as a consequence of his cholecystectomy. Since he is having diarrhea this far out from the surgery I have prepared him that he will likely have this for the rest of his life. Refills provided. Migraine: He declines abortive therapy Acne: Discussed that this is most likely due to his irregular pattern of using testosterone and that this may improve if he takes it as prescribed. In the meantime use BenzaClin as needed for any acne flares.    Return if symptoms worsen or fail to improve.

## 2014-07-09 ENCOUNTER — Telehealth: Payer: Self-pay | Admitting: Family Medicine

## 2014-07-09 MED ORDER — CLINDAMYCIN PHOS-BENZOYL PEROX 1.2-5 % EX GEL: CUTANEOUS | Status: DC

## 2014-07-09 NOTE — Telephone Encounter (Signed)
Insurance requires Duac gel.

## 2014-07-25 ENCOUNTER — Emergency Department
Admission: EM | Admit: 2014-07-25 | Discharge: 2014-07-25 | Disposition: A | Discharge status: Home / Self Care | Payer: 59 | Source: Home / Self Care | Attending: Family Medicine | Admitting: Family Medicine

## 2014-07-25 ENCOUNTER — Encounter: Payer: Self-pay | Admitting: Emergency Medicine

## 2014-07-25 DIAGNOSIS — B9789 Other viral agents as the cause of diseases classified elsewhere: Principal | ICD-10-CM

## 2014-07-25 DIAGNOSIS — J069 Acute upper respiratory infection, unspecified: Secondary | ICD-10-CM

## 2014-07-25 MED ORDER — AZITHROMYCIN 250 MG PO TABS: ORAL_TABLET | ORAL | Status: DC

## 2014-07-25 MED ORDER — METHYLPREDNISOLONE SODIUM SUCC 125 MG IJ SOLR
80.0000 mg | Freq: Once | INTRAMUSCULAR | Status: AC
  Administered 2014-07-25: 80 mg via INTRAMUSCULAR

## 2014-07-25 MED ORDER — PREDNISONE 20 MG PO TABS: 20.0000 mg | ORAL_TABLET | Freq: Two times a day (BID) | ORAL | Status: DC

## 2014-07-25 MED ORDER — BENZONATATE 200 MG PO CAPS: 200.0000 mg | ORAL_CAPSULE | Freq: Every day | ORAL | Status: DC

## 2014-07-25 NOTE — ED Provider Notes (Signed)
CSN: [id number]     Arrival date & time 07/25/14  1745 History   First MD Initiated Contact with Patient 07/25/14 1856     Chief Complaint  Patient presents with  . Cough  . Nasal Congestion  . Fatigue  . Headache      HPI Comments: Patient complains of onset of sinus congestion about 8 days ago.  Two days later he developed a cough that has persisted, and last night developed wheezing.   He has a history of perennial allergies, and had asthma as a child.  He has had pneumonia in the past.  The history is provided by the patient.    Past Medical History  Diagnosis Date  . Environmental allergies   . Pneumonia    Past Surgical History  Procedure Laterality Date  . Knee surgery    . Cholecystectomy    . Back surgery    . Shoulder surgery     Family History  Problem Relation Age of Onset  . Cancer Mother     uterine  . Arrhythmia Mother    History  Substance Use Topics  . Smoking status: Never Smoker   . Smokeless tobacco: Current User    Types: Snuff  . Alcohol Use: No    Review of Systems + sore throat, resolved + cough No pleuritic pain + wheezing + nasal congestion + post-nasal drainage No sinus pain/pressure No itchy/red eyes ? earache No hemoptysis No SOB No fever/chills No nausea No vomiting No abdominal pain No diarrhea No urinary symptoms No skin rash + fatigue No myalgias + headache Used OTC meds without relief  Allergies  Review of patient's allergies indicates no known allergies.  Home Medications   Prior to Admission medications   Medication Sig Start Date End Date Taking? Authorizing Provider  azithromycin (ZITHROMAX Z-PAK) 250 MG tablet Take 2 tabs today; then begin one tab once daily for 4 more days. 07/25/14   Lattie HawStephen A Beese, MD  benzonatate (TESSALON) 200 MG capsule Take 1 capsule (200 mg total) by mouth at bedtime. Take as needed for cough 07/25/14   Lattie HawStephen A Beese, MD  Clindamycin-Benzoyl Per, Refr, gel Apply to acne prone  areas one to two times a day as needed for acne control. 07/09/14   Laren BoomSean Hommel, DO  colesevelam (WELCHOL) 625 MG tablet Take 3 tablets (1,875 mg total) by mouth 2 (two) times daily with a meal. 07/04/14   Sean Hommel, DO  predniSONE (DELTASONE) 20 MG tablet Take 1 tablet (20 mg total) by mouth 2 (two) times daily. Take with food.  Begin 07/26/14 07/25/14   Lattie HawStephen A Beese, MD  testosterone cypionate (DEPO-TESTOSTERONE) 200 MG/ML injection Inject 1.2 mLs (240 mg total) into the muscle every 14 (fourteen) days. 06/05/14   Sean Hommel, DO   BP 131/86 mmHg  Pulse 72  Temp(Src) 98 F (36.7 C)  Resp 16  Ht 5\' 6"  (1.676 m)  Wt 180 lb (81.647 kg)  BMI 29.07 kg/m2  SpO2 96% Physical Exam Nursing notes and Vital Signs reviewed. Appearance:  Patient appears healthy, stated age, and in no acute distress Eyes:  Pupils are equal, round, and reactive to light and accomodation.  Extraocular movement is intact.  Conjunctivae are not inflamed  Ears:  Canals normal.  Tympanic membranes normal.  Nose:  Mildly congested turbinates.  No sinus tenderness.   Pharynx:  Normal Neck:  Supple.   Nontender but enlarged posterior nodes are palpated bilaterally  Lungs:  Clear to auscultation.  Breath sounds are equal.  Heart:  Regular rate and rhythm without murmurs, rubs, or gallops.  Abdomen:  Nontender without masses or hepatosplenomegaly.  Bowel sounds are present.  No CVA or flank tenderness.  Extremities:  No edema.  No calf tenderness Skin:  No rash present.   ED Course  Procedures none     MDM   1. Viral URI with cough    Solumedrol 80mg  IM.  Begin prednisone burst tomorrow.  Begin Z-pack for atypical coverage. Prescription written for Benzonatate [facility](Tessalon) to take at bedtime for night-time cough.  Take plain Mucinex (1200 mg guaifenesin) twice daily for cough and congestion.  May add Sudafed for sinus congestion.   Increase fluid intake, rest. May use Afrin nasal spray (or generic oxymetazoline)  twice daily for about 5 days.  Also recommend using saline nasal spray several times daily and saline nasal irrigation (AYR is a common brand) Try warm salt water gargles for sore throat.  Stop all antihistamines for now, and other non-prescription cough/cold preparations.   Follow-up with family doctor if not improving about one week.    Lattie HawStephen A Beese, MD 07/27/14 [phone]

## 2014-07-25 NOTE — Discharge Instructions (Signed)
Take plain Mucinex (1200 mg guaifenesin) twice daily for cough and congestion.  May add Sudafed for sinus congestion.   Increase fluid intake, rest. °May use Afrin nasal spray (or generic oxymetazoline) twice daily for about 5 days.  Also recommend using saline nasal spray several times daily and saline nasal irrigation (AYR is a common brand) °Try warm salt water gargles for sore throat.  °Stop all antihistamines for now, and other non-prescription cough/cold preparations. °Follow-up with family doctor if not improving about one week °

## 2014-07-25 NOTE — ED Notes (Signed)
Reports 8 days of cough, congestion, headache, fatigue; has history of both bronchitis and pneumonia and wants to be evaluated.

## 2014-07-28 ENCOUNTER — Telehealth: Payer: Self-pay | Admitting: Emergency Medicine

## 2014-07-28 NOTE — ED Notes (Signed)
Inquired about patient's status; encourage them to call with questions/concerns.  

## 2014-10-27 ENCOUNTER — Encounter: Payer: Self-pay | Admitting: Family Medicine

## 2014-11-30 ENCOUNTER — Encounter: Payer: Self-pay | Admitting: *Deleted

## 2014-11-30 ENCOUNTER — Emergency Department
Admission: EM | Admit: 2014-11-30 | Discharge: 2014-11-30 | Disposition: A | Discharge status: Home / Self Care | Payer: 59 | Source: Home / Self Care | Attending: Family Medicine | Admitting: Family Medicine

## 2014-11-30 DIAGNOSIS — J302 Other seasonal allergic rhinitis: Secondary | ICD-10-CM

## 2014-11-30 MED ORDER — METHYLPREDNISOLONE SODIUM SUCC 125 MG IJ SOLR
125.0000 mg | Freq: Once | INTRAMUSCULAR | Status: AC
  Administered 2014-11-30: 125 mg via INTRAMUSCULAR

## 2014-11-30 MED ORDER — FLUTICASONE-SALMETEROL 100-50 MCG/DOSE IN AEPB: 1.0000 | INHALATION_SPRAY | Freq: Two times a day (BID) | RESPIRATORY_TRACT | Status: DC

## 2014-11-30 MED ORDER — GUAIFENESIN-CODEINE 100-10 MG/5ML PO SOLN: ORAL | Status: DC

## 2014-11-30 MED ORDER — PREDNISONE 20 MG PO TABS: 20.0000 mg | ORAL_TABLET | Freq: Two times a day (BID) | ORAL | Status: DC

## 2014-11-30 MED ORDER — DOXYCYCLINE HYCLATE 100 MG PO CAPS: 100.0000 mg | ORAL_CAPSULE | Freq: Two times a day (BID) | ORAL | Status: DC

## 2014-11-30 NOTE — ED Provider Notes (Signed)
CSN: [id number]     Arrival date & time 11/30/14  1114 History   First MD Initiated Contact with Patient 11/30/14 1145     Chief Complaint  Patient presents with  . Nasal Congestion      HPI Comments: Patient has a history of seasonal allergic rhinitis, and has had recurrent congestion and sneezing over the past six weeks but has felt well.  Over the past 48 hours his symptoms have worsened including sneezing, ear pressure, fatigue, scratchy throat, and cough.  His usual allergy medications have not been helpful.  The history is provided by the patient.    Past Medical History  Diagnosis Date  . Environmental allergies   . Pneumonia    Past Surgical History  Procedure Laterality Date  . Knee surgery    . Cholecystectomy    . Back surgery    . Shoulder surgery     Family History  Problem Relation Age of Onset  . Cancer Mother     uterine  . Arrhythmia Mother    History  Substance Use Topics  . Smoking status: Never Smoker   . Smokeless tobacco: Current User    Types: Snuff  . Alcohol Use: No    Review of Systems + mild sore throat + hoarse + cough No pleuritic pain + wheezing + sneezing + nasal congestion + post-nasal drainage No sinus pain/pressure No itchy/red eyes No earache but has pressure in ears No hemoptysis No SOB No fever/chills No nausea No vomiting No abdominal pain No diarrhea No urinary symptoms No skin rash + fatigue No myalgias No headache Used OTC meds without relief  Allergies  Review of patient's allergies indicates no known allergies.  Home Medications   Prior to Admission medications   Medication Sig Start Date End Date Taking? Authorizing Provider  cetirizine (ZYRTEC) 10 MG tablet Take 10 mg by mouth daily.   Yes Historical Provider, MD  fluticasone (FLONASE) 50 MCG/ACT nasal spray Place into both nostrils daily.   Yes Historical Provider, MD  mometasone (NASONEX) 50 MCG/ACT nasal spray Place 2 sprays into the nose daily.    Yes Historical Provider, MD  Clindamycin-Benzoyl Per, Refr, gel Apply to acne prone areas one to two times a day as needed for acne control. 07/09/14   Laren BoomSean Hommel, DO  colesevelam (WELCHOL) 625 MG tablet Take 3 tablets (1,875 mg total) by mouth 2 (two) times daily with a meal. 07/04/14   Sean Hommel, DO  doxycycline (VIBRAMYCIN) 100 MG capsule Take 1 capsule (100 mg total) by mouth 2 (two) times daily. Take with food (Rx void after 12/08/14) 11/30/14   Lattie HawStephen A Mahi Zabriskie, MD  Fluticasone-Salmeterol (ADVAIR DISKUS) 100-50 MCG/DOSE AEPB Inhale 1 puff into the lungs 2 (two) times daily. 11/30/14   Lattie HawStephen A Norinne Jeane, MD  guaiFENesin-codeine 100-10 MG/5ML syrup Take 10mL by mouth at bedtime as needed for cough 11/30/14   Lattie HawStephen A Brynli Ollis, MD  predniSONE (DELTASONE) 20 MG tablet Take 1 tablet (20 mg total) by mouth 2 (two) times daily. Take with food.  Begin 12/01/14. 11/30/14   Lattie HawStephen A Mellissa Conley, MD  testosterone cypionate (DEPO-TESTOSTERONE) 200 MG/ML injection Inject 1.2 mLs (240 mg total) into the muscle every 14 (fourteen) days. 06/05/14   Sean Hommel, DO   BP 113/78 mmHg  Pulse 83  Temp(Src) 97.5 F (36.4 C) (Oral)  Ht 5\' 6"  (1.676 m)  Wt 186 lb 8 oz (84.596 kg)  BMI 30.12 kg/m2  SpO2 98% Physical Exam Nursing notes and Vital  Signs reviewed. Appearance:  Patient appears stated age, and in no acute distress Eyes:  Pupils are equal, round, and reactive to light and accomodation.  Extraocular movement is intact.  Conjunctivae are not inflamed  Ears:  Canals normal.  Tympanic membranes normal.  Nose:  Congested turbinates.  No sinus tenderness.   Pharynx:  Normal Neck:  Supple.  Enlarged tender posterior nodes are palpated bilaterally  Lungs:  Clear to auscultation.  Breath sounds are equal.  Heart:  Regular rate and rhythm without murmurs, rubs, or gallops.  Abdomen:  Nontender without masses or hepatosplenomegaly.  Bowel sounds are present.  No CVA or flank tenderness.  Extremities:  No edema.  No calf  tenderness Skin:  No rash present.   ED Course  Procedures  none   MDM   1. Seasonal allergic rhinitis; suspect early viral URI   Solumedrol .  Begin prednisone burst tomorrow.  Robitussin AC at bedtime. Begin Advair 100/50 for about 2 weeks until improved. Take plain guaifenesin (  extended release tabs such as Mucinex) twice daily, with plenty of water, for cough and congestion.  May add Pseudoephedrine ( , one or two every 4 to 6 hours) for sinus congestion.  Get adequate rest.   May use Afrin nasal spray (or generic oxymetazoline) twice daily for about 5 days.  Also recommend using saline nasal spray several times daily and saline nasal irrigation (AYR is a common brand).  Use Flonase nasal spray each morning after using Afrin nasal spray and saline nasal irrigation. Try warm salt water gargles for sore throat.  Stop all antihistamines for now, and other non-prescription cough/cold preparations. Begin Doxycycline if not improving about one week or if persistent fever develops (Given a prescription to hold, with an expiration date)  Follow-up with family doctor if not improving about10 days.     Lattie Haw, MD 12/06/14 1036

## 2014-11-30 NOTE — ED Notes (Signed)
Pt states he gets bad environmental allergies every year and has been experiencing symptoms the last 6 weeks including: sneezing, watery itchy eyes, cough, congestion, post nasal drip.  Symptoms got worse yesterday.

## 2014-11-30 NOTE — Discharge Instructions (Signed)
Take plain guaifenesin (1200mg  extended release tabs such as Mucinex) twice daily, with plenty of water, for cough and congestion.  May add Pseudoephedrine (30mg , one or two every 4 to 6 hours) for sinus congestion.  Get adequate rest.   May use Afrin nasal spray (or generic oxymetazoline) twice daily for about 5 days.  Also recommend using saline nasal spray several times daily and saline nasal irrigation (AYR is a common brand).  Use Flonase nasal spray each morning after using Afrin nasal spray and saline nasal irrigation. Try warm salt water gargles for sore throat.  Stop all antihistamines for now, and other non-prescription cough/cold preparations. Begin Doxycycline if not improving about one week or if persistent fever develops   Follow-up with family doctor if not improving about10 days.

## 2015-01-31 ENCOUNTER — Emergency Department (HOSPITAL_BASED_OUTPATIENT_CLINIC_OR_DEPARTMENT_OTHER)
Admission: EM | Admit: 2015-01-31 | Discharge: 2015-02-01 | Disposition: A | Discharge status: Home / Self Care | Payer: 59 | Attending: Emergency Medicine | Admitting: Emergency Medicine

## 2015-01-31 ENCOUNTER — Encounter (HOSPITAL_BASED_OUTPATIENT_CLINIC_OR_DEPARTMENT_OTHER): Payer: Self-pay | Admitting: Emergency Medicine

## 2015-01-31 ENCOUNTER — Emergency Department (HOSPITAL_BASED_OUTPATIENT_CLINIC_OR_DEPARTMENT_OTHER): Payer: 59

## 2015-01-31 DIAGNOSIS — Z79899 Other long term (current) drug therapy: Secondary | ICD-10-CM | POA: Insufficient documentation

## 2015-01-31 DIAGNOSIS — R079 Chest pain, unspecified: Secondary | ICD-10-CM | POA: Diagnosis present

## 2015-01-31 DIAGNOSIS — Z791 Long term (current) use of non-steroidal anti-inflammatories (NSAID): Secondary | ICD-10-CM | POA: Diagnosis not present

## 2015-01-31 DIAGNOSIS — R0789 Other chest pain: Secondary | ICD-10-CM | POA: Diagnosis not present

## 2015-01-31 DIAGNOSIS — Z8701 Personal history of pneumonia (recurrent): Secondary | ICD-10-CM | POA: Diagnosis not present

## 2015-01-31 DIAGNOSIS — M546 Pain in thoracic spine: Secondary | ICD-10-CM | POA: Insufficient documentation

## 2015-01-31 LAB — BASIC METABOLIC PANEL
Anion gap: 10 (ref 5–15)
BUN: 16 mg/dL (ref 6–20)
CALCIUM: 8.9 mg/dL (ref 8.9–10.3)
CO2: 22 mmol/L (ref 22–32)
Chloride: 106 mmol/L (ref 101–111)
Creatinine, Ser: 0.95 mg/dL (ref 0.61–1.24)
GFR calc Af Amer: >60 mL/min (ref >60)
GFR calc non Af Amer: >60 mL/min (ref >60)
GLUCOSE: 123 mg/dL — AB (ref 65–99)
Potassium: 3.5 mmol/L (ref 3.5–5.1)
Sodium: 138 mmol/L (ref 135–145)

## 2015-01-31 LAB — CBC
HEMATOCRIT: 46.5 % (ref 39.0–52.0)
HEMOGLOBIN: 16.2 g/dL (ref 13.0–17.0)
MCH: 30.1 pg (ref 26.0–34.0)
MCHC: 34.8 g/dL (ref 30.0–36.0)
MCV: 86.3 fL (ref 78.0–100.0)
PLATELETS: UNDETERMINED 10*3/uL (ref 150–400)
RBC: 5.39 MIL/uL (ref 4.22–5.81)
RDW: 12.6 % (ref 11.5–15.5)
WBC: 6.8 10*3/uL (ref 4.0–10.5)

## 2015-01-31 LAB — BRAIN NATRIURETIC PEPTIDE: B Natriuretic Peptide: 12.4 pg/mL (ref 0.0–100.0)

## 2015-01-31 LAB — TROPONIN I: Troponin I: <0.03 ng/mL (ref <0.031)

## 2015-01-31 MED ORDER — IBUPROFEN 800 MG PO TABS
800.0000 mg | ORAL_TABLET | Freq: Once | ORAL | Status: AC
  Administered 2015-01-31: 800 mg via ORAL
  Filled 2015-01-31: qty 1

## 2015-01-31 MED ORDER — CYCLOBENZAPRINE HCL 10 MG PO TABS
5.0000 mg | ORAL_TABLET | Freq: Once | ORAL | Status: AC
  Administered 2015-01-31: 5 mg via ORAL
  Filled 2015-01-31: qty 1

## 2015-01-31 NOTE — ED Notes (Signed)
Placed on cont cardiac monitoring with cont POX monitoring, [place] @ 2lpm via [place]

## 2015-01-31 NOTE — ED Notes (Signed)
Pt was driving in car when felt his chest tighten like "someone punched me" and states pain radiates from left chest wrapping around left side to back, progressively feeling "tighter".

## 2015-01-31 NOTE — ED Provider Notes (Signed)
CSN: [id number]     Arrival date & time 01/31/15  1915 History   First MD Initiated Contact with Patient 01/31/15 1939     Chief Complaint  Patient presents with  . Chest Pain     (Consider location/radiation/quality/duration/timing/severity/associated sxs/prior Treatment) HPI Comments: Pt comes in with left sided chest pain that came on acutely and radiated to his back. Pt states that his back feels like he is having muscle spasms. The pain lasted about 20 minutes and now he is having back pain and left lower rib pain with movement. He was driving when the pain started. He states that he was doing heavy lifting earlier today. No nausea. Some sob with the chest pain but that has resolved. No diaphoresis. No history of htn or dm. No history or similar symptoms.  The history is provided by the patient. No language interpreter was used.    Past Medical History  Diagnosis Date  . Environmental allergies   . Pneumonia    Past Surgical History  Procedure Laterality Date  . Knee surgery    . Cholecystectomy    . Back surgery    . Shoulder surgery     Family History  Problem Relation Age of Onset  . Cancer Mother     uterine  . Arrhythmia Mother    History  Substance Use Topics  . Smoking status: Never Smoker   . Smokeless tobacco: Current User    Types: Snuff  . Alcohol Use: No    Review of Systems  All other systems reviewed and are negative.     Allergies  Review of patient's allergies indicates no known allergies.  Home Medications   Prior to Admission medications   Medication Sig Start Date End Date Taking? Authorizing Provider  cetirizine (ZYRTEC) 10 MG tablet Take 10 mg by mouth daily.    Historical Provider, MD  Clindamycin-Benzoyl Per, Refr, gel Apply to acne prone areas one to two times a day as needed for acne control. 07/09/14   Laren Boom, DO  colesevelam (WELCHOL) 625 MG tablet Take 3 tablets (1,875 mg total) by mouth 2 (two) times daily with a meal.  07/04/14   Sean Hommel, DO  doxycycline (VIBRAMYCIN) 100 MG capsule Take 1 capsule (100 mg total) by mouth 2 (two) times daily. Take with food (Rx void after 12/08/14) 11/30/14   Lattie Haw, MD  fluticasone (FLONASE) 50 MCG/ACT nasal spray Place into both nostrils daily.    Historical Provider, MD  Fluticasone-Salmeterol (ADVAIR DISKUS) 100-50 MCG/DOSE AEPB Inhale 1 puff into the lungs 2 (two) times daily. 11/30/14   Lattie Haw, MD  guaiFENesin-codeine 100-10 MG/5ML syrup Take 10mL by mouth at bedtime as needed for cough 11/30/14   Lattie Haw, MD  mometasone (NASONEX) 50 MCG/ACT nasal spray Place 2 sprays into the nose daily.    Historical Provider, MD  predniSONE (DELTASONE) 20 MG tablet Take 1 tablet (20 mg total) by mouth 2 (two) times daily. Take with food.  Begin 12/01/14. 11/30/14   Lattie Haw, MD  testosterone cypionate (DEPO-TESTOSTERONE) 200 MG/ML injection Inject 1.2 mLs (240 mg total) into the muscle every 14 (fourteen) days. 06/05/14   Sean Hommel, DO   BP 144/92 mmHg  Pulse 96  Temp(Src) 98.3 F (36.8 C) (Oral)  Resp 20  Ht  (1.676 m)  Wt 186 lb (84.369 kg)  BMI 30.04 kg/m2  SpO2 98% Physical Exam  Constitutional: He is oriented to person, place, and time. He  appears well-developed and well-nourished.  Cardiovascular: Normal rate and regular rhythm.   Pulmonary/Chest: Effort normal and breath sounds normal.  Abdominal: Soft. Bowel sounds are normal. There is no tenderness.  Musculoskeletal: Normal range of motion.  Left upper back tender to palpation  Neurological: He is alert and oriented to person, place, and time.  Skin: Skin is warm and dry.  Psychiatric: He has a normal mood and affect.  Nursing note and vitals reviewed.   ED Course  Procedures (including critical care time) Labs Review Labs Reviewed  BASIC METABOLIC PANEL - Abnormal; Notable for the following:    Glucose, Bld 123 (*)    All other components within normal limits  CBC   TROPONIN I  BRAIN NATRIURETIC PEPTIDE  TROPONIN I  D-DIMER, QUANTITATIVE (NOT AT [facility])    Imaging Review Dg Chest Port 1 View  01/31/2015   CLINICAL DATA:  Chest pain  EXAM: PORTABLE CHEST - 1 VIEW  COMPARISON:  10/12/2010  FINDINGS: The heart size and mediastinal contours are within normal limits. Both lungs are clear. Lungs are hypoaerated with crowding of the bronchovascular markings. Cholecystectomy clips are noted. The visualized skeletal structures are unremarkable.  IMPRESSION: No active disease.   Electronically Signed   By: Christiana Pellant M.D.   On: 01/31/2015 19:37     EKG Interpretation None      MDM   Final diagnoses:  Other chest pain    ekg normal. Doubt acs. Will send home with flexeril for back pain. Considered dissection although unlikely;delta trop negative.    Teressa Lower, NP 02/01/15 3790  Glynn Octave, MD 02/01/15 2409

## 2015-01-31 NOTE — ED Notes (Addendum)
Pt reports mid chest pressure like a cramp in his chest that radiated to his back denies arm shoulder or jaw pain. SOB during episode of chest discomfort that has since resolved. Rate Left chest sensation at 2/10 and describes as mild discomfort, " like I had over worked the muscles in my left chest and back", " right now I feel tight on my left chest.

## 2015-01-31 NOTE — ED Notes (Signed)
Labs that were drawn by previous assigned nurse not resulting at this time and specimens not found in lab, redraw of blood sample done, labeled and hand delivered to lab by this Clinical research associate.

## 2015-01-31 NOTE — ED Provider Notes (Signed)
ED ECG REPORT   Date: 01/31/2015  Rate: 99  Rhythm: normal sinus rhythm  QRS Axis: normal  Intervals: normal  ST/T Wave abnormalities: normal  Conduction Disutrbances:none  Narrative Interpretation:   Old EKG Reviewed: none available  I have personally reviewed the EKG tracing and agree with the computerized printout as noted.   Glynn Octave, MD 01/31/15 2036

## 2015-02-01 LAB — TROPONIN I: Troponin I: <0.03 ng/mL (ref <0.031)

## 2015-02-01 LAB — D-DIMER, QUANTITATIVE (NOT AT ARMC)

## 2015-02-01 MED ORDER — CYCLOBENZAPRINE HCL 5 MG PO TABS: 5.0000 mg | ORAL_TABLET | Freq: Two times a day (BID) | ORAL | Status: DC | PRN

## 2015-02-01 NOTE — Discharge Instructions (Signed)

## 2015-02-25 ENCOUNTER — Other Ambulatory Visit: Payer: Self-pay | Admitting: Family Medicine

## 2015-06-10 ENCOUNTER — Other Ambulatory Visit: Payer: Self-pay

## 2015-06-10 MED ORDER — COLESEVELAM HCL 625 MG PO TABS: ORAL_TABLET | ORAL | Status: DC

## 2015-06-10 NOTE — Telephone Encounter (Signed)
Pt has not been seen since last Nov..

## 2015-06-15 ENCOUNTER — Encounter: Payer: Self-pay | Admitting: *Deleted

## 2015-06-15 ENCOUNTER — Emergency Department (INDEPENDENT_AMBULATORY_CARE_PROVIDER_SITE_OTHER)
Admission: EM | Admit: 2015-06-15 | Discharge: 2015-06-15 | Disposition: A | Discharge status: Home / Self Care | Payer: 59 | Source: Home / Self Care | Attending: Family Medicine | Admitting: Family Medicine

## 2015-06-15 DIAGNOSIS — J209 Acute bronchitis, unspecified: Secondary | ICD-10-CM | POA: Diagnosis not present

## 2015-06-15 MED ORDER — PREDNISONE 20 MG PO TABS: ORAL_TABLET | ORAL | Status: DC

## 2015-06-15 MED ORDER — ALBUTEROL SULFATE HFA 108 (90 BASE) MCG/ACT IN AERS: 1.0000 | INHALATION_SPRAY | Freq: Four times a day (QID) | RESPIRATORY_TRACT | Status: DC | PRN

## 2015-06-15 MED ORDER — BENZONATATE 100 MG PO CAPS: 100.0000 mg | ORAL_CAPSULE | Freq: Three times a day (TID) | ORAL | Status: DC

## 2015-06-15 MED ORDER — DOXYCYCLINE HYCLATE 100 MG PO CAPS: 100.0000 mg | ORAL_CAPSULE | Freq: Two times a day (BID) | ORAL | Status: DC

## 2015-06-15 MED ORDER — METHYLPREDNISOLONE SODIUM SUCC 125 MG IJ SOLR
80.0000 mg | Freq: Once | INTRAMUSCULAR | Status: AC
  Administered 2015-06-15: 80 mg via INTRAMUSCULAR

## 2015-06-15 NOTE — ED Provider Notes (Signed)
CSN: [id number]     Arrival date & time 06/15/15  1623 History   First MD Initiated Contact with Patient 06/15/15 1643     Chief Complaint  Patient presents with  . Cough  . Nasal Congestion   (Consider location/radiation/quality/duration/timing/severity/associated sxs/prior Treatment) HPI  Pt is a 39yo male presenting to [facility] with c/o worsening productive cough for 3 weeks.  Pt notes yellow sputum.   Cough is moderately intermittent, occasionally productive.  Associated body aches and shortness of breath with fatigue.  He has been using OTC medications including Nyquil and Mucinex for cough that were helping initially but cough seems to be worsening now. Hx of pneumonia and bronchitis.  No sick contacts or recent travel.  Denies n/v/d but states when he has coughing spells he feels like he may vomit.  Pt states he normally gets a shot of steroids which helps with his cough.   Past Medical History  Diagnosis Date  . Environmental allergies   . Pneumonia    Past Surgical History  Procedure Laterality Date  . Knee surgery    . Cholecystectomy    . Back surgery    . Shoulder surgery     Family History  Problem Relation Age of Onset  . Cancer Mother     uterine  . Arrhythmia Mother    Social History  Substance Use Topics  . Smoking status: Never Smoker   . Smokeless tobacco: Current User    Types: Snuff  . Alcohol Use: No    Review of Systems  Constitutional: Positive for fatigue. Negative for fever and chills.  HENT: Positive for congestion, rhinorrhea, sinus pressure and sore throat. Negative for ear pain, trouble swallowing and voice change.   Respiratory: Positive for cough, shortness of breath and wheezing.   Cardiovascular: Positive for chest pain. Negative for palpitations.  Gastrointestinal: Negative for nausea, vomiting, abdominal pain and diarrhea.  Musculoskeletal: Positive for myalgias and arthralgias. Negative for back pain.  Skin: Negative for rash.  All other  systems reviewed and are negative.   Allergies  Review of patient's allergies indicates no known allergies.  Home Medications   Prior to Admission medications   Medication Sig Start Date End Date Taking? Authorizing Provider  albuterol (PROVENTIL HFA;VENTOLIN HFA) 108 (90 BASE) MCG/ACT inhaler Inhale 1-2 puffs into the lungs every 6 (six) hours as needed for wheezing or shortness of breath. 06/15/15   Junius Finner, PA-C  benzonatate (TESSALON) 100 MG capsule Take 1 capsule (100 mg total) by mouth every 8 (eight) hours. 06/15/15   Junius Finner, PA-C  cetirizine (ZYRTEC) 10 MG tablet Take 10 mg by mouth daily.    Historical Provider, MD  Clindamycin-Benzoyl Per, Refr, gel Apply to acne prone areas one to two times a day as needed for acne control. 07/09/14   Laren Boom, DO  colesevelam (WELCHOL) 625 MG tablet Take 3 tablets by mouth two times daily with a meal. Please follow up by November 2016 for refills. 06/10/15   Laren Boom, DO  doxycycline (VIBRAMYCIN) 100 MG capsule Take 1 capsule (100 mg total) by mouth 2 (two) times daily. One po bid x 7 days 06/15/15   Junius Finner, PA-C  fluticasone [facility]) 50 MCG/ACT nasal spray Place into both nostrils daily.    Historical Provider, MD  mometasone (NASONEX) 50 MCG/ACT nasal spray Place 2 sprays into the nose daily.    Historical Provider, MD  predniSONE (DELTASONE) 20 MG tablet 3 tabs po day one, then 2 po daily  x 4 days 06/15/15   Junius FinnerErin O'Malley, PA-C  testosterone cypionate (DEPO-TESTOSTERONE) 200 MG/ML injection Inject 1.2 mLs (240 mg total) into the muscle every 14 (fourteen) days. 06/05/14   Laren BoomSean Hommel, DO   Meds Ordered and Administered this Visit   Medications  methylPREDNISolone sodium succinate (SOLU-MEDROL) 125 mg/2 mL injection 80 mg (80 mg Intramuscular Given 06/15/15 1706)    BP 134/77 mmHg  Pulse 75  Temp(Src) 98.7 F (37.1 C) (Oral)  Resp 16  Wt 190 lb (86.183 kg)  SpO2 97% No data found.   Physical Exam   Constitutional: He appears well-developed and well-nourished.  HENT:  Head: Normocephalic and atraumatic.  Right Ear: Hearing, tympanic membrane, external ear and ear canal normal.  Left Ear: Hearing, tympanic membrane, external ear and ear canal normal.  Nose: Mucosal edema present.  Mouth/Throat: Uvula is midline and mucous membranes are normal. Posterior oropharyngeal erythema present.  Eyes: Conjunctivae are normal. No scleral icterus.  Neck: Normal range of motion. Neck supple.  Cardiovascular: Normal rate, regular rhythm and normal heart sounds.   Pulmonary/Chest: Effort normal. No respiratory distress. He has no wheezes. He has rales. He exhibits no tenderness.  Diffuse rhonchi, clears with coughing. Intermittent dry cough on exam. No respiratory distress   Abdominal: Soft. He exhibits no distension and no mass. There is no tenderness. There is no rebound and no guarding.  Musculoskeletal: Normal range of motion.  Neurological: He is alert.  Skin: Skin is warm and dry.  Nursing note and vitals reviewed.   ED Course  Procedures (including critical care time)  Labs Review Labs Reviewed - No data to display  Imaging Review No results found.   MDM   1. Acute bronchitis, unspecified organism     Pt c/o worsening cough for 3 weeks, associated body aches and SOB. Vitals: WNL, afebrile, O2 Sat 97% on RA Rx: Doxycycline, prednisone, albuterol, and tessalon. Advised pt to use acetaminophen and ibuprofen as needed for fever and pain. Encouraged rest and fluids. F/u with PCP in 1 week if not improving, sooner if worsening.  Pt verbalized understanding and agreement with tx plan.    Junius FinnerErin O'Malley, PA-C 06/15/15 [phone]

## 2015-06-15 NOTE — ED Notes (Signed)
Pt c/o non-productive cough, nasal congestion with yellow mucous x 3 weeks. H/o PNA.

## 2015-06-15 NOTE — Discharge Instructions (Signed)
Please take antibiotics as prescribed and be sure to complete entire course even if you start to feel better to ensure infection does not come back.  Please take the tessalon at night for cough.  You may also find relief by taking a warm steam shower at night as well as running a humidifier.    Acute Bronchitis Bronchitis is inflammation of the airways that extend from the windpipe into the lungs (bronchi). The inflammation often causes mucus to develop. This leads to a cough, which is the most common symptom of bronchitis.  In acute bronchitis, the condition usually develops suddenly and goes away over time, usually in a couple weeks. Smoking, allergies, and asthma can make bronchitis worse. Repeated episodes of bronchitis may cause further lung problems.  CAUSES Acute bronchitis is most often caused by the same virus that causes a cold. The virus can spread from person to person (contagious) through coughing, sneezing, and touching contaminated objects. SIGNS AND SYMPTOMS   Cough.   Fever.   Coughing up mucus.   Body aches.   Chest congestion.   Chills.   Shortness of breath.   Sore throat.  DIAGNOSIS  Acute bronchitis is usually diagnosed through a physical exam. Your health care provider will also ask you questions about your medical history. Tests, such as chest X-rays, are sometimes done to rule out other conditions.  TREATMENT  Acute bronchitis usually goes away in a couple weeks. Oftentimes, no medical treatment is necessary. Medicines are sometimes given for relief of fever or cough. Antibiotic medicines are usually not needed but may be prescribed in certain situations. In some cases, an inhaler may be recommended to help reduce shortness of breath and control the cough. A cool mist vaporizer may also be used to help thin bronchial secretions and make it easier to clear the chest.  HOME CARE INSTRUCTIONS  Get plenty of rest.   Drink enough fluids to keep your  urine clear or pale yellow (unless you have a medical condition that requires fluid restriction). Increasing fluids may help thin your respiratory secretions (sputum) and reduce chest congestion, and it will prevent dehydration.   Take medicines only as directed by your health care provider.  If you were prescribed an antibiotic medicine, finish it all even if you start to feel better.  Avoid smoking and secondhand smoke. Exposure to cigarette smoke or irritating chemicals will make bronchitis worse. If you are a smoker, consider using nicotine gum or skin patches to help control withdrawal symptoms. Quitting smoking will help your lungs heal faster.   Reduce the chances of another bout of acute bronchitis by washing your hands frequently, avoiding people with cold symptoms, and trying not to touch your hands to your mouth, nose, or eyes.   Keep all follow-up visits as directed by your health care provider.  SEEK MEDICAL CARE IF: Your symptoms do not improve after 1 week of treatment.  SEEK IMMEDIATE MEDICAL CARE IF:  You develop an increased fever or chills.   You have chest pain.   You have severe shortness of breath.  You have bloody sputum.   You develop dehydration.  You faint or repeatedly feel like you are going to pass out.  You develop repeated vomiting.  You develop a severe headache. MAKE SURE YOU:   Understand these instructions.  Will watch your condition.  Will get help right away if you are not doing well or get worse.   This information is not intended to replace advice  given to you by your health care provider. Make sure you discuss any questions you have with your health care provider.   Document Released: 09/08/2004 Document Revised: 08/22/2014 Document Reviewed: 01/22/2013 Elsevier Interactive Patient Education [employer]

## 2015-06-20 ENCOUNTER — Telehealth: Payer: Self-pay | Admitting: *Deleted

## 2015-06-20 MED ORDER — LEVOFLOXACIN 500 MG PO TABS
500.0000 mg | ORAL_TABLET | Freq: Every day | ORAL | Status: DC
Start: 2015-06-20 — End: 2015-10-08

## 2015-06-23 NOTE — Care Management (Signed)
Subjective:  Patient reports no improvement. PLAN:  Will begin Levaquin 500mg  daily.

## 2015-09-07 ENCOUNTER — Other Ambulatory Visit: Payer: Self-pay

## 2015-09-07 MED ORDER — COLESEVELAM HCL 625 MG PO TABS: ORAL_TABLET | ORAL | Status: DC

## 2015-09-08 ENCOUNTER — Other Ambulatory Visit: Payer: Self-pay

## 2015-09-08 MED ORDER — COLESEVELAM HCL 625 MG PO TABS: ORAL_TABLET | ORAL | Status: DC

## 2015-10-01 ENCOUNTER — Telehealth: Payer: Self-pay

## 2015-10-01 NOTE — Telephone Encounter (Signed)
A 30 day supply of welchol was sent to mail order pharmacy on 09/08/15.  Pt stated that the pharmacy automatically charged him for a 90 day supply.  He wants to know can he get the other 60 days sent to the pharmacy before his appointment on 10/08/15? Pt has not been seen since November of 2015.

## 2015-10-02 ENCOUNTER — Other Ambulatory Visit: Payer: Self-pay

## 2015-10-02 MED ORDER — COLESEVELAM HCL 625 MG PO TABS: ORAL_TABLET | ORAL | Status: DC

## 2015-10-02 NOTE — Telephone Encounter (Signed)
Rx sent to mail order

## 2015-10-02 NOTE — Telephone Encounter (Signed)
Yes I am OK with this, can you please send the 60 day supply to whichever pharmacy he prefers.

## 2015-10-08 ENCOUNTER — Encounter: Payer: Self-pay | Admitting: Family Medicine

## 2015-10-08 ENCOUNTER — Ambulatory Visit (INDEPENDENT_AMBULATORY_CARE_PROVIDER_SITE_OTHER): Payer: 59 | Admitting: Family Medicine

## 2015-10-08 VITALS — BP 129/82 | HR 64 | Wt 183.0 lb

## 2015-10-08 DIAGNOSIS — L709 Acne, unspecified: Secondary | ICD-10-CM

## 2015-10-08 DIAGNOSIS — K529 Noninfective gastroenteritis and colitis, unspecified: Secondary | ICD-10-CM

## 2015-10-08 DIAGNOSIS — J302 Other seasonal allergic rhinitis: Secondary | ICD-10-CM

## 2015-10-08 MED ORDER — CLINDAMYCIN PHOS-BENZOYL PEROX 1.2-5 % EX GEL: CUTANEOUS | Status: DC

## 2015-10-08 MED ORDER — PREDNISONE 20 MG PO TABS: ORAL_TABLET | ORAL | Status: AC

## 2015-10-08 MED ORDER — COLESEVELAM HCL 625 MG PO TABS: ORAL_TABLET | ORAL | Status: DC

## 2015-10-08 NOTE — Progress Notes (Signed)
CC: Allen Fisher is a 40 y.o. male is here for Medication Refill and Allergies   Subjective: HPI:  Follow-up diarrhea: Still taking WelChol on a daily basis, if he eats rich food he'll need to take 3 capsules 2 times a day, if he eats healthy such as a light salad he only has to take 2 capsules twice a day. He denies any diarrhea at the present time. He denies any abdominal discomfort or bowel irregularity.  Follow-up acne: He still gets pimples on his chest and back on the days that he asked where his bulletproof vest. He never ended up getting the clindamycin benzyl peroxide preparation due to the pharmacy not having it. He's not been able to get any benefit from over-the-counter acne medication. He denies facial lesions.  Complains of nasal congestion, facial pressure and postnasal drip. He is currently managing this with Flonase and over-the-counter antihistamines. He states his symptoms are only mild in severity however once the trees start blooming he usually needs to the urgent care center to get prednisone. He wants this on hand just in case symptoms get bad like they have ever single spring since he's been alive. He denies cough, wheezing, fevers, chills or chest discomfort   Review Of Systems Outlined In HPI  Past Medical History  Diagnosis Date  . Environmental allergies   . Pneumonia     Past Surgical History  Procedure Laterality Date  . Knee surgery    . Cholecystectomy    . Back surgery    . Shoulder surgery     Family History  Problem Relation Age of Onset  . Cancer Mother     uterine  . Arrhythmia Mother     Social History   Social History  . Marital Status: Married    Spouse Name: N/A  . Number of Children: N/A  . Years of Education: N/A   Occupational History  . Not on file.   Social History Main Topics  . Smoking status: Never Smoker   . Smokeless tobacco: Current User    Types: Snuff  . Alcohol Use: No  . Drug Use: No  . Sexual Activity:   Partners: Female   Other Topics Concern  . Not on file   Social History Narrative     Objective: BP 129/82 mmHg  Pulse 64  Wt 183 lb (83.008 kg)  Vital signs reviewed. General: Alert and Oriented, No Acute Distress HEENT: Pupils equal, round, reactive to light. Conjunctivae clear.  External ears unremarkable.  Moist mucous membranes. Lungs: Clear and comfortable work of breathing, speaking in full sentences without accessory muscle use. Cardiac: Regular rate and rhythm.  Neuro: CN II-XII grossly intact, gait normal. Extremities: No peripheral edema.  Strong peripheral pulses.  Mental Status: No depression, anxiety, nor agitation. Logical though process. Skin: Warm and dry. Assessment & Plan: Allen Fisher was seen today for medication refill and allergies.  Diagnoses and all orders for this visit:  Chronic diarrhea -     colesevelam (WELCHOL) 625 MG tablet; Take 3 tablets by mouth two times daily with a meal.  Acne, unspecified acne type -     Clindamycin-Benzoyl Per, Refr, gel; Apply to acne prone areas one to two times a day as needed for acne control.  Seasonal allergies -     predniSONE (DELTASONE) 20 MG tablet; Three tabs daily days 1-3, two tabs daily days 4-6, one tab daily days 7-9, half tab daily days 10-13.    chronic diarrhea ever since a  cholecystectomy currently controlled with WelChol   acne: ""Clindamycin benzyl peroxide on an as-needed basis Seasonal allergies: Continue Flonase and over-the-counter antihistamines, it's extremely likely that his symptoms aren't bad enough to where he would need a prednisone taper therefore I'll let him have this on hand to take on an as-needed basis one symptoms peak.     Return in about 1 year (around 10/07/2016) for Diarrhea.

## 2015-10-20 MED FILL — predniSONE 20 MG TABS: 20 | 13 days supply | Qty: 20 | Fill #0

## 2015-11-20 ENCOUNTER — Other Ambulatory Visit: Payer: Self-pay | Admitting: Nurse Practitioner

## 2015-11-20 ENCOUNTER — Ambulatory Visit
Admission: RE | Admit: 2015-11-20 | Discharge: 2015-11-20 | Disposition: A | Discharge status: Home / Self Care | Payer: Worker's Compensation | Source: Ambulatory Visit | Attending: Nurse Practitioner | Admitting: Nurse Practitioner

## 2015-11-20 DIAGNOSIS — W19XXXA Unspecified fall, initial encounter: Secondary | ICD-10-CM

## 2016-02-01 ENCOUNTER — Other Ambulatory Visit: Payer: Self-pay

## 2016-02-01 DIAGNOSIS — K529 Noninfective gastroenteritis and colitis, unspecified: Secondary | ICD-10-CM

## 2016-02-01 MED ORDER — COLESEVELAM HCL 625 MG PO TABS: ORAL_TABLET | ORAL | Status: DC

## 2016-02-09 ENCOUNTER — Ambulatory Visit (INDEPENDENT_AMBULATORY_CARE_PROVIDER_SITE_OTHER): Payer: 59 | Admitting: Family Medicine

## 2016-02-09 ENCOUNTER — Encounter: Payer: Self-pay | Admitting: Family Medicine

## 2016-02-09 VITALS — BP 125/92 | HR 90 | Wt 187.0 lb

## 2016-02-09 DIAGNOSIS — J4521 Mild intermittent asthma with (acute) exacerbation: Secondary | ICD-10-CM | POA: Diagnosis not present

## 2016-02-09 MED ORDER — PREDNISONE 20 MG PO TABS: ORAL_TABLET | ORAL | Status: AC

## 2016-02-09 MED ORDER — AMOXICILLIN-POT CLAVULANATE 500-125 MG PO TABS: ORAL_TABLET | ORAL | Status: DC

## 2016-02-09 NOTE — Progress Notes (Signed)
CC: Allen ParrMichael Fisher is a 40 y.o. male is here for Cough   Subjective: HPI:  CC: Allen ParrMichael Fisher is a 40 y.o. male is here for Cough   Subjective: HPI:  The past 2-3 days he's noticed a worsening cough. It's not accompanied by any wheezing but he anticipates that's going to start any our now. Nothing seems to make the symptoms better or worse. He's tried allergy medicine and nasal steroids. He tells me he feels great from the neck up but his chest feels somewhat constricted and full of mucus. He denies any exertional component to his symptoms. He denies fevers, chills, no shortness of breath. He denies rash or itchy eyes. Denies facial pressure sinus pressure, nor runny nose. Symptoms are moderate in severity and slowly worsening   Review Of Systems Outlined In HPI  Past Medical History  Diagnosis Date  . Environmental allergies   . Pneumonia     Past Surgical History  Procedure Laterality Date  . Knee surgery    . Cholecystectomy    . Back surgery    . Shoulder surgery     Family History  Problem Relation Age of Onset  . Cancer Mother     uterine  . Arrhythmia Mother     Social History   Social History  . Marital Status: Married    Spouse Name: N/A  . Number of Children: N/A  . Years of Education: N/A   Occupational History  . Not on file.   Social History Main Topics  . Smoking status: Never Smoker   . Smokeless tobacco: Current User    Types: Snuff  . Alcohol Use: No  . Drug Use: No  . Sexual Activity:    Partners: Female   Other Topics Concern  . Not on file   Social History Narrative     Objective: BP 125/92 mmHg  Pulse 90  Wt 187 lb (84.823 kg)  General: Alert and Oriented, No Acute Distress HEENT: Pupils equal, round, reactive to light. Conjunctivae clear.  External ears unremarkable, canals clear with intact TMs with appropriate landmarks.  Middle ear appears open without effusion. Pink inferior turbinates.  Moist mucous membranes, pharynx without  inflammation nor lesions.  Neck supple without palpable lymphadenopathy nor abnormal masses. Lungs: Comfortable work of breathing, occasional coughing, mild central rhonchi on inspiration but no rales or wheezing. Cardiac: Regular rate and rhythm. Normal S1/S2.  No murmurs, rubs, nor gallops.   Extremities: No peripheral edema.  Strong peripheral pulses.  Mental Status: No depression, anxiety, nor agitation. Skin: Warm and dry.  Assessment & Plan: Allen Fisher was seen today for cough.  Diagnoses and all orders for this visit:  Asthma with exacerbation, mild intermittent -     predniSONE (DELTASONE) 20 MG tablet; Three tabs at once daily for five days. -     amoxicillin-clavulanate (AUGMENTIN) 500-125 MG tablet; Take one by mouth every 8 hours for ten total days.   Suspect mild asthma exacerbation, his exacerbations usually get worse quite quickly therefore start prednisone and Augmentin. Initially intended him to have doxycycline however he plans on being outside for the majority of July 4 so we'll start with Augmentin and call me if no better by Thursday  No Follow-up on file.

## 2016-02-11 ENCOUNTER — Telehealth: Payer: Self-pay

## 2016-02-11 MED ORDER — HYDROCODONE-HOMATROPINE 5-1.5 MG/5ML PO SYRP: 5.0000 mL | ORAL_SOLUTION | Freq: Three times a day (TID) | ORAL | Status: DC | PRN

## 2016-02-11 MED FILL — HYDROCODONE-HOMATROPINE SYR: 5-1.5 | 8 days supply | Qty: 120 | Fill #0

## 2016-02-11 NOTE — Telephone Encounter (Signed)
Pt.notified

## 2016-02-11 NOTE — Telephone Encounter (Signed)
Pt reports that he feels like the mucus in his chest has broken but he still have the cough and it keeps him up at night. Please advise.

## 2016-02-11 NOTE — Telephone Encounter (Signed)
Continue with the antibiotic and prednisone, Rx for hycodan in your in box.

## 2016-02-12 ENCOUNTER — Telehealth: Payer: Self-pay

## 2016-02-12 MED ORDER — LEVOFLOXACIN 500 MG PO TABS: 500.0000 mg | ORAL_TABLET | Freq: Every day | ORAL | Status: DC

## 2016-02-12 NOTE — Telephone Encounter (Signed)
A stronger antibiotic called leviquin has been sent to [facility] to take the place of the amoxicillin he was previously prescribed.

## 2016-02-12 NOTE — Telephone Encounter (Signed)
Pt.notified

## 2016-02-12 NOTE — Telephone Encounter (Signed)
Pt called stating even with the hycodan that he feels that his cough is worse.  He stated that hycodan didn't help with his cough either last night. He is coughing up yellow mucus. Please advise.

## 2016-04-29 MED FILL — ONDANSETRON HCL 4 MG TABLET: 4 | 3 days supply | Qty: 15 | Fill #0

## 2016-04-29 MED FILL — NAPROXEN 500 MG TABLET: 500 | 30 days supply | Qty: 60 | Fill #0

## 2016-04-29 MED FILL — diazePAM 5 MG TABS: 5 | 10 days supply | Qty: 40 | Fill #0

## 2016-04-29 MED FILL — OXYCODONE/APAP 5/325 MG TAB: 5-325 | 4 days supply | Qty: 40 | Fill #0

## 2016-05-11 MED FILL — TEMAZEPAM 30 MG CAPSULE: 30 | 30 days supply | Qty: 30 | Fill #0

## 2016-10-06 ENCOUNTER — Emergency Department
Admission: EM | Admit: 2016-10-06 | Discharge: 2016-10-06 | Disposition: A | Discharge status: Home / Self Care | Payer: 59 | Source: Home / Self Care | Attending: Family Medicine | Admitting: Family Medicine

## 2016-10-06 ENCOUNTER — Encounter: Payer: Self-pay | Admitting: *Deleted

## 2016-10-06 DIAGNOSIS — B9789 Other viral agents as the cause of diseases classified elsewhere: Secondary | ICD-10-CM

## 2016-10-06 DIAGNOSIS — J069 Acute upper respiratory infection, unspecified: Secondary | ICD-10-CM

## 2016-10-06 DIAGNOSIS — J9801 Acute bronchospasm: Secondary | ICD-10-CM

## 2016-10-06 MED ORDER — METHYLPREDNISOLONE SODIUM SUCC 125 MG IJ SOLR
125.0000 mg | Freq: Once | INTRAMUSCULAR | Status: AC
  Administered 2016-10-06: 125 mg via INTRAMUSCULAR

## 2016-10-06 MED ORDER — PREDNISONE 20 MG PO TABS: ORAL_TABLET | ORAL | 0 refills | Status: DC

## 2016-10-06 MED ORDER — AZITHROMYCIN 250 MG PO TABS: ORAL_TABLET | ORAL | 0 refills | Status: DC

## 2016-10-06 MED ORDER — GUAIFENESIN-CODEINE 100-10 MG/5ML PO SOLN: ORAL | 0 refills | Status: DC

## 2016-10-06 NOTE — ED Triage Notes (Signed)
Pt c/o wheezing and nonproductive cough x 1 wk. Denies fever. He has taken OTC allergy med. He reports usually steroid injection, prednisone taper and ABT help when he has these s/s.

## 2016-10-06 NOTE — Discharge Instructions (Signed)
Take plain guaifenesin (1200mg extended release tabs such as Mucinex) twice daily, with plenty of water, for cough and congestion.  May add Pseudoephedrine (30mg, one or two every 4 to 6 hours) for sinus congestion.  Get adequate rest.   °May use Afrin nasal spray (or generic oxymetazoline) twice daily for about 5 days and then discontinue.  Also recommend using saline nasal spray several times daily and saline nasal irrigation (AYR is a common brand).  Use Flonase nasal spray each morning after using Afrin nasal spray and saline nasal irrigation. °Try warm salt water gargles for sore throat.  °Stop all antihistamines for now, and other non-prescription cough/cold preparations. °  °  °

## 2016-10-06 NOTE — ED Provider Notes (Signed)
[facility]    CSN: [id number] Arrival date & time: 10/06/16  1913     History   Chief Complaint Chief Complaint  Patient presents with  . Cough    HPI Allen Fisher is a 41 y.o. male.   About 1.5 weeks ago patient developed typical cold-like symptoms developing over several days, including mild sore throat, sinus congestion, fatigue, and cough.  He has now developed wheezing and shortness of breath, worse at night.  He states that in the past, similar symptoms improved with prednisone and antibiotic.   The history is provided by the patient.    Past Medical History:  Diagnosis Date  . Environmental allergies   . Pneumonia     Patient Active Problem List   Diagnosis Date Noted  . Acne vulgaris 07/04/2014  . Chronic diarrhea 07/04/2014  . Hypogonadism male 06/05/2014  . Gilbert's syndrome 06/05/2014  . IBS (irritable bowel syndrome) 06/05/2014  . Obesity 06/05/2014  . Elevated LFTs 06/05/2014  . ASTHMA, MILD 10/12/2010    Past Surgical History:  Procedure Laterality Date  . BACK SURGERY    . CHOLECYSTECTOMY    . KNEE SURGERY    . SHOULDER SURGERY         Home Medications    Prior to Admission medications   Medication Sig Start Date End Date Taking? Authorizing Provider  colesevelam (WELCHOL) 625 MG tablet Take 3 tablets by mouth two times daily with a meal. 02/01/16  Yes Sean Hommel, DO  azithromycin (ZITHROMAX Z-PAK) 250 MG tablet Take 2 tabs today; then begin one tab once daily for 4 more days. 10/06/16   Lattie HawStephen A Tammey Deeg, MD  cetirizine (ZYRTEC) 10 MG tablet Take 10 mg by mouth daily.    Historical Provider, MD  fluticasone (FLONASE) 50 MCG/ACT nasal spray Place into both nostrils daily.    Historical Provider, MD  guaiFENesin-codeine 100-10 MG/5ML syrup Take 10mL by mouth at bedtime as needed for cough 10/06/16   Lattie HawStephen A Darus Hershman, MD  predniSONE (DELTASONE) 20 MG tablet Take one tab by mouth twice daily for 5 days, then one daily for 3 days.  Take with food. 10/06/16   Lattie HawStephen A Jonahtan Manseau, MD    Family History Family History  Problem Relation Age of Onset  . Cancer Mother     uterine  . Arrhythmia Mother     Social History Social History  Substance Use Topics  . Smoking status: Never Smoker  . Smokeless tobacco: Current User    Types: Snuff  . Alcohol use No     Allergies   Patient has no known allergies.   Review of Systems Review of Systems + sore throat + cough No pleuritic pain + wheezing + nasal congestion + post-nasal drainage No sinus pain/pressure No itchy/red eyes No earache No hemoptysis No SOB No fever/chills No nausea No vomiting No abdominal pain No diarrhea No urinary symptoms No skin rash + fatigue No myalgias No headache Used OTC meds without relief   Physical Exam Triage Vital Signs ED Triage Vitals  Enc Vitals Group     BP 10/06/16 1942 145/93     Pulse Rate 10/06/16 1942 78     Resp 10/06/16 1942 16     Temp 10/06/16 1942 98.5 F (36.9 C)     Temp Source 10/06/16 1942 Oral     SpO2 10/06/16 1942 98 %     Weight 10/06/16 1943 193 lb (87.5 kg)     Height --  Head Circumference --      Peak Flow --      Pain Score 10/06/16 1944 0     Pain Loc --      Pain Edu? --      Excl. in GC? --    No data found.   Updated Vital Signs BP 145/93 (BP Location: Left Arm)   Pulse 78   Temp 98.5 F (36.9 C) (Oral)   Resp 16   Wt 193 lb (87.5 kg)   SpO2 98%   BMI 31.15 kg/m   Visual Acuity Right Eye Distance:   Left Eye Distance:   Bilateral Distance:    Right Eye Near:   Left Eye Near:    Bilateral Near:     Physical Exam Nursing notes and Vital Signs reviewed. Appearance:  Patient appears stated age, and in no acute distress Eyes:  Pupils are equal, round, and reactive to light and accomodation.  Extraocular movement is intact.  Conjunctivae are not inflamed  Ears:  Canals normal.  Tympanic membranes normal.  Nose:  Mildly congested turbinates.  No sinus  tenderness.    Pharynx:  Normal Neck:  Supple.  Tender enlarged posterior/lateral nodes are palpated bilaterally  Lungs:   Scattered faint bilateral expiratory wheezes.  Breath sounds are equal.  Moving air well. Heart:  Regular rate and rhythm without murmurs, rubs, or gallops.  Abdomen:  Nontender without masses or hepatosplenomegaly.  Bowel sounds are present.  No CVA or flank tenderness.  Extremities:  No edema.  Skin:  No rash present.    UC Treatments / Results  Labs (all labs ordered are listed, but only abnormal results are displayed) Labs Reviewed - No data to display  EKG  EKG Interpretation None       Radiology No results found.  Procedures Procedures (including critical care time)  Medications Ordered in UC Medications  methylPREDNISolone sodium succinate (SOLU-MEDROL) 125 mg/2 mL injection 125 mg (125 mg Intramuscular Given 10/06/16 2029)     Initial Impression / Assessment and Plan / UC Course  I have reviewed the triage vital signs and the nursing notes.  Pertinent labs & imaging results that were available during my care of the patient were reviewed by me and considered in my medical decision making (see chart for details).    Administered Solumedrol 125mg  IM.  Begin prednisone burst/taper tomorrow. Begin Z-pak for atypical coverage. Rx for Robitussin AC for night time cough.  Take plain guaifenesin (1200mg  extended release tabs such as Mucinex) twice daily, with plenty of water, for cough and congestion.  May add Pseudoephedrine (30mg , one or two every 4 to 6 hours) for sinus congestion.  Get adequate rest.   May use Afrin nasal spray (or generic oxymetazoline) twice daily for about 5 days and then discontinue.  Also recommend using saline nasal spray several times daily and saline nasal irrigation (AYR is a common brand).  Use Flonase nasal spray each morning after using Afrin nasal spray and saline nasal irrigation. Try warm salt water gargles for sore  throat.  Stop all antihistamines for now, and other non-prescription cough/cold preparations. Followup with Family Doctor if not improved in one week.     Final Clinical Impressions(s) / UC Diagnoses   Final diagnoses:  Viral URI with cough  Bronchospasm    New Prescriptions New Prescriptions   AZITHROMYCIN (ZITHROMAX Z-PAK) 250 MG TABLET    Take 2 tabs today; then begin one tab once daily for 4 more days.  GUAIFENESIN-CODEINE 100-10 MG/5ML SYRUP    Take 10mL by mouth at bedtime as needed for cough   PREDNISONE (DELTASONE) 20 MG TABLET    Take one tab by mouth twice daily for 5 days, then one daily for 3 days. Take with food.     Lattie Haw, MD 10/17/16 [phone]

## 2016-11-29 ENCOUNTER — Encounter: Payer: Self-pay | Admitting: *Deleted

## 2016-11-29 ENCOUNTER — Emergency Department
Admission: EM | Admit: 2016-11-29 | Discharge: 2016-11-29 | Disposition: A | Discharge status: Home / Self Care | Payer: 59 | Source: Home / Self Care | Attending: Emergency Medicine | Admitting: Emergency Medicine

## 2016-11-29 DIAGNOSIS — H1013 Acute atopic conjunctivitis, bilateral: Secondary | ICD-10-CM | POA: Diagnosis not present

## 2016-11-29 DIAGNOSIS — H01116 Allergic dermatitis of left eye, unspecified eyelid: Secondary | ICD-10-CM

## 2016-11-29 DIAGNOSIS — H01113 Allergic dermatitis of right eye, unspecified eyelid: Secondary | ICD-10-CM

## 2016-11-29 DIAGNOSIS — L509 Urticaria, unspecified: Secondary | ICD-10-CM

## 2016-11-29 MED ORDER — METHYLPREDNISOLONE ACETATE 80 MG/ML IJ SUSP
80.0000 mg | Freq: Once | INTRAMUSCULAR | Status: AC
  Administered 2016-11-29: 80 mg via INTRAMUSCULAR

## 2016-11-29 MED ORDER — OLOPATADINE HCL 0.2 % OP SOLN: OPHTHALMIC | 1 refills | Status: DC

## 2016-11-29 MED ORDER — PREDNISONE 10 MG PO TABS: ORAL_TABLET | ORAL | 0 refills | Status: DC

## 2016-11-29 NOTE — ED Provider Notes (Signed)
[name]    CSN: [id number] Arrival date & time: 11/29/16  1926     History   Chief Complaint Chief Complaint  Patient presents with  . Eye Problem    HPI Allen Fisher is a 41 y.o. male.   HPI History of severe seasonal allergies. Complains of severe allergy flareup the past 2 days. Seasonal Allergy symptoms started 3 weeks ago and have become progressively worsening. Now complains of watery itchy burning eyes and eyelids bilaterally and on face. Some swelling and itch on face and trunk and extremities.  Felt like he might of had some wheezing yesterday, but no current wheezing or shortness of breath. He denies fever or chills or chest pain or abdominal pain or nausea or vomiting. No cough. Has clear nasal drainage but no discolored mucus. Has tried OTC allergy eye drops without relief and he requests allergy eyedrop prescription in addition to "shot of cortisone" and prednisone oral burst prescription, as that has worked well in the past for similar severe seasonal allergy flareups. Past Medical History:  Diagnosis Date  . Environmental allergies   . Pneumonia     Patient Active Problem List   Diagnosis Date Noted  . Acne vulgaris 07/04/2014  . Chronic diarrhea 07/04/2014  . Hypogonadism male 06/05/2014  . Gilbert's syndrome 06/05/2014  . IBS (irritable bowel syndrome) 06/05/2014  . Obesity 06/05/2014  . Elevated LFTs 06/05/2014  . ASTHMA, MILD 10/12/2010    Past Surgical History:  Procedure Laterality Date  . BACK SURGERY    . CHOLECYSTECTOMY    . KNEE SURGERY    . SHOULDER SURGERY         Home Medications    Prior to Admission medications   Medication Sig Start Date End Date Taking? Authorizing Provider  colesevelam (WELCHOL) 625 MG tablet Take 3 tablets by mouth two times daily with a meal. 02/01/16   Sean Hommel, DO  fluticasone (FLONASE) 50 MCG/ACT nasal spray Place into both nostrils daily.    Historical Provider, MD  Olopatadine HCl  0.2 % SOLN Apply 1 drop to each affected eye twice a day. For allergic conjunctivitis. 11/29/16   Lajean Manes, MD  predniSONE (DELTASONE) 10 MG tablet Take as directed for 6 days.--Take 6 on day 1, 5 on day 2, 4 on day 3, then 3 tablets on day 4, then 2 tablets on day 5, then 1 on day 6. 11/29/16   Lajean Manes, MD    Family History Family History  Problem Relation Age of Onset  . Cancer Mother     uterine  . Arrhythmia Mother     Social History Social History  Substance Use Topics  . Smoking status: Never Smoker  . Smokeless tobacco: Current User    Types: Snuff  . Alcohol use No     Allergies   Patient has no known allergies.   Review of Systems Review of Systems  All other systems reviewed and are negative.    Physical Exam Triage Vital Signs ED Triage Vitals  Enc Vitals Group     BP 11/29/16 1945 (!) 149/100     Pulse Rate 11/29/16 1945 84     Resp 11/29/16 1945 16     Temp 11/29/16 1945 98.4 F (36.9 C)     Temp Source 11/29/16 1945 Oral     SpO2 11/29/16 1945 97 %     Weight 11/29/16 1945 196 lb (88.9 kg)     Height 11/29/16 1945  (1.676 m)  Head Circumference --      Peak Flow --      Pain Score 11/29/16 1946 0     Pain Loc --      Pain Edu? --      Excl. in GC? --    No data found.   Updated Vital Signs BP (!) 149/100 (BP Location: Left Arm)   Pulse 84   Temp 98.4 F (36.9 C) (Oral)   Resp 16   Ht  (1.676 m)   Wt 196 lb (88.9 kg)   SpO2 97%   BMI 31.64 kg/m    Physical Exam  Constitutional: He is oriented to person, place, and time. He appears well-developed and well-nourished. He appears distressed (From facial swelling and itch).  HENT:  Head: Atraumatic.  Nose: Mucosal edema present. No epistaxis.  Mouth/Throat: Oropharynx is clear and moist. No oropharyngeal exudate or posterior oropharyngeal edema.  Face: Erythematous maculopapular confluent rash/swelling on face and eyelids. No pustules or fluctuance or sign of  infection.  Oropharynx: Clear Airway intact  Eyes: EOM are normal. Pupils are equal, round, and reactive to light. Right eye exhibits no discharge. No foreign body present in the right eye. Left eye exhibits no discharge. No foreign body present in the left eye. No scleral icterus.  Bilateral eyelids red and swollen without exudate or fluctuance. Conjunctivae injected. No discolored conjunctival discharge.  EOMI, PERRL  Visual acuity: 20/20 on left 20/20 on right 20/20 both eyes  Neck: Normal range of motion. Neck supple. No JVD present. No tracheal deviation present.  No adenopathy  Cardiovascular: Normal rate, regular rhythm and normal heart sounds.   No murmur heard. Pulmonary/Chest: Effort normal and breath sounds normal. He has no wheezes.  Abdominal: Soft. He exhibits no distension. There is no tenderness.  Musculoskeletal: He exhibits no tenderness.  Lymphadenopathy:    He has no cervical adenopathy.  Neurological: He is alert and oriented to person, place, and time. No cranial nerve deficit. He exhibits normal muscle tone.  Skin: Skin is warm and dry. Capillary refill takes less than 2 seconds. Rash noted. Rash is urticarial.  Diffuse blanching erythematous maculopapular rash, consistent with hives/urticaria, on eyelids and face and trunk. No lip swelling. Oral airway intact. No wheezing  Psychiatric: He has a normal mood and affect. His behavior is normal.  Vitals reviewed.    UC Treatments / Results  Labs (all labs ordered are listed, but only abnormal results are displayed) Labs Reviewed - No data to display  EKG  EKG Interpretation None       Radiology No results found.  Procedures Procedures (including critical care time)  Medications Ordered in UC Medications  methylPREDNISolone acetate (DEPO-MEDROL) injection 80 mg (80 mg Intramuscular Given 11/29/16 2022)    Initial Impression / Assessment and Plan / UC Course  I have reviewed the triage vital  signs and the nursing notes.  Pertinent labs & imaging results that were available during my care of the patient were reviewed by me and considered in my medical decision making (see chart for details).    Final Clinical Impressions(s) / UC Diagnoses   Final diagnoses:  Allergic conjunctivitis of both eyes  Allergic blepharitis, left  Allergic blepharitis, right  Urticaria  No evidence of airway compromise or wheezing.  Treatment options discussed, as well as risks, benefits, alternatives. Patient voiced understanding and agreement with the following plans: Depo-Medrol 80 mg IM stat. Oral prednisone burst. Olopatadine eyedrops rx. Other symptomatic care discussed. Follow-up with  your primary care doctor and/or eye dr in 3-5 days if not improving, or sooner if symptoms become worse. Precautions discussed. Red flags discussed.-Emergency room if any red flag Questions invited and answered. Patient voiced understanding and agreement.  New Prescriptions Discharge Medication List as of 11/29/2016  8:23 PM    START taking these medications   Details  Olopatadine HCl 0.2 % SOLN Apply 1 drop to each affected eye twice a day. For allergic conjunctivitis., Print    predniSONE (DELTASONE) 10 MG tablet Take as directed for 6 days.--Take 6 on day 1, 5 on day 2, 4 on day 3, then 3 tablets on day 4, then 2 tablets on day 5, then 1 on day 6., Print         Lajean Manes, MD 12/01/16 1151

## 2016-11-29 NOTE — ED Triage Notes (Signed)
Pt c/o watery, itching and burning in his eyes bilaterally x 3 wks. He is taking flonase, [facility] allergy, and claritin without relief. He has also instilled OTC allergy eye gtts without relief.

## 2016-12-05 ENCOUNTER — Ambulatory Visit: Payer: 59 | Admitting: Osteopathic Medicine

## 2016-12-13 ENCOUNTER — Ambulatory Visit (INDEPENDENT_AMBULATORY_CARE_PROVIDER_SITE_OTHER): Payer: 59 | Admitting: Osteopathic Medicine

## 2016-12-13 ENCOUNTER — Encounter: Payer: Self-pay | Admitting: Osteopathic Medicine

## 2016-12-13 ENCOUNTER — Telehealth: Payer: Self-pay

## 2016-12-13 VITALS — BP 128/87 | HR 74 | Ht 66.0 in | Wt 191.0 lb

## 2016-12-13 DIAGNOSIS — B351 Tinea unguium: Secondary | ICD-10-CM

## 2016-12-13 DIAGNOSIS — J4520 Mild intermittent asthma, uncomplicated: Secondary | ICD-10-CM

## 2016-12-13 DIAGNOSIS — R030 Elevated blood-pressure reading, without diagnosis of hypertension: Secondary | ICD-10-CM

## 2016-12-13 DIAGNOSIS — R945 Abnormal results of liver function studies: Secondary | ICD-10-CM

## 2016-12-13 DIAGNOSIS — Z9049 Acquired absence of other specified parts of digestive tract: Secondary | ICD-10-CM

## 2016-12-13 DIAGNOSIS — J302 Other seasonal allergic rhinitis: Secondary | ICD-10-CM | POA: Diagnosis not present

## 2016-12-13 DIAGNOSIS — R7989 Other specified abnormal findings of blood chemistry: Secondary | ICD-10-CM

## 2016-12-13 DIAGNOSIS — K580 Irritable bowel syndrome with diarrhea: Secondary | ICD-10-CM

## 2016-12-13 HISTORY — DX: Acquired absence of other specified parts of digestive tract: Z90.49

## 2016-12-13 LAB — COMPLETE METABOLIC PANEL WITH GFR
ALBUMIN: 3.9 g/dL (ref 3.6–5.1)
ALK PHOS: 39 U/L — AB (ref 40–115)
ALT: 34 U/L (ref 9–46)
AST: 17 U/L (ref 10–40)
BUN: 12 mg/dL (ref 7–25)
CHLORIDE: 110 mmol/L (ref 98–110)
CO2: 19 mmol/L — ABNORMAL LOW (ref 20–31)
Calcium: 8.9 mg/dL (ref 8.6–10.3)
Creat: 0.95 mg/dL (ref 0.60–1.35)
Glucose, Bld: 83 mg/dL (ref 65–99)
POTASSIUM: 4.5 mmol/L (ref 3.5–5.3)
Sodium: 143 mmol/L (ref 135–146)
Total Bilirubin: 1.8 mg/dL — ABNORMAL HIGH (ref 0.2–1.2)
Total Protein: 6.3 g/dL (ref 6.1–8.1)

## 2016-12-13 LAB — CBC WITH DIFFERENTIAL/PLATELET
BASOS PCT: 0 %
Basophils Absolute: 0 cells/uL (ref 0–200)
EOS ABS: 130 {cells}/uL (ref 15–500)
Eosinophils Relative: 2 %
HCT: 48.3 % (ref 38.5–50.0)
Hemoglobin: 16.1 g/dL (ref 13.2–17.1)
LYMPHS ABS: 1885 {cells}/uL (ref 850–3900)
Lymphocytes Relative: 29 %
MCH: 29 pg (ref 27.0–33.0)
MCHC: 33.3 g/dL (ref 32.0–36.0)
MCV: 86.9 fL (ref 80.0–100.0)
MONO ABS: 520 {cells}/uL (ref 200–950)
MPV: 12.2 fL (ref 7.5–12.5)
Monocytes Relative: 8 %
Neutro Abs: 3965 cells/uL (ref 1500–7800)
Neutrophils Relative %: 61 %
PLATELETS: 164 10*3/uL (ref 140–400)
RBC: 5.56 MIL/uL (ref 4.20–5.80)
RDW: 13.9 % (ref 11.0–15.0)
WBC: 6.5 10*3/uL (ref 3.8–10.8)

## 2016-12-13 LAB — LIPID PANEL
CHOL/HDL RATIO: 3.4 ratio (ref <5.0)
Cholesterol: 135 mg/dL (ref <200)
HDL: 40 mg/dL — ABNORMAL LOW (ref >40)
LDL Cholesterol: 77 mg/dL (ref <100)
TRIGLYCERIDES: 91 mg/dL (ref <150)
VLDL: 18 mg/dL (ref <30)

## 2016-12-13 LAB — TSH: TSH: 1.16 mIU/L (ref 0.40–4.50)

## 2016-12-13 MED ORDER — MONTELUKAST SODIUM 10 MG PO TABS: 10.0000 mg | ORAL_TABLET | Freq: Every day | ORAL | 1 refills | Status: DC

## 2016-12-13 MED ORDER — EFINACONAZOLE 10 % EX SOLN: CUTANEOUS | 3 refills | Status: DC

## 2016-12-13 MED ORDER — CICLOPIROX 8 % EX SOLN: Freq: Every day | CUTANEOUS | 0 refills | Status: DC

## 2016-12-13 NOTE — Telephone Encounter (Signed)
Spoke to the pharmacist and Nystatin is an affordable  alternative and it comes in multiple forms, oral, cream, or powder. Please advise. Rhonda Cunningham,CMA

## 2016-12-13 NOTE — Telephone Encounter (Signed)
Nystatin won't work for his condition, I faxed another alternative and if this isn't covered will try oral medications

## 2016-12-13 NOTE — Telephone Encounter (Signed)
Patient called stated that Efinaconazole is not covered by his insurance it will cost him $800 out of pocket. Patient is requesting a more affordable Rx for nail fungus sent to his pharmacy. Please advise Estelle June

## 2016-12-13 NOTE — Progress Notes (Signed)
HPI: Allen Fisher is a 41 y.o. male  who presents to [name] today, 12/13/16,  for chief complaint of:  Chief Complaint  Patient presents with  . Other    Switch from Hommel/ blood pressure concerns    Head pressure and then will check BP at [facility] 140/90's or higher on about 5 different occasions. Reports frontal head pressure, worse with leaning forward. No vision changes, no other headache symptoms. No nausea/vomiting. He wants to start an exercise program but wife said he should get checked out by the doctor first.  History of seasonal allergies/asthma as a child: Occasionally will have asthma type symptoms with acute illness that developed into bronchitis requiring steroids. Has been on Singulair in the past which has helped allergies.  Onychomycosis: Has been using over-the-counter topical nail treatments without much benefit over the past 3 months or so.  History of [place] syndrome: Hasn't had labs checked in some time, last CMP looks okay, see lab review below.  History of loose stool/GI upset postcholecystectomy: Has been doing well on colesevelam for several years.     Past medical, surgical, social and family history reviewed: Patient Active Problem List   Diagnosis Date Noted  . Acne vulgaris 07/04/2014  . Hypogonadism male 06/05/2014  . Gilbert's syndrome 06/05/2014  . IBS (irritable bowel syndrome) 06/05/2014  . Obesity 06/05/2014  . Elevated LFTs 06/05/2014  . Asthma 10/12/2010   Past Surgical History:  Procedure Laterality Date  . BACK SURGERY    . CHOLECYSTECTOMY    . KNEE SURGERY    . SHOULDER SURGERY     Social History  Substance Use Topics  . Smoking status: Never Smoker  . Smokeless tobacco: Current User    Types: Snuff  . Alcohol use No   Family History  Problem Relation Age of Onset  . Cancer Mother     uterine  . Arrhythmia Mother      Current medication list and allergy/intolerance information  reviewed:   Current Outpatient Prescriptions  Medication Sig Dispense Refill  . colesevelam (WELCHOL) 625 MG tablet Take 3 tablets by mouth two times daily with a meal. 360 tablet 3  . fluticasone (FLONASE) 50 MCG/ACT nasal spray Place into both nostrils daily.    . Olopatadine HCl 0.2 % SOLN Apply 1 drop to each affected eye twice a day. For allergic conjunctivitis. 2.5 mL 1  . predniSONE (DELTASONE) 10 MG tablet Take as directed for 6 days.--Take 6 on day 1, 5 on day 2, 4 on day 3, then 3 tablets on day 4, then 2 tablets on day 5, then 1 on day 6. 21 tablet 0   No current facility-administered medications for this visit.    No Known Allergies    Review of Systems:  Constitutional:  No  fever, no chills, No recent illness, No unintentional weight changes. No significant fatigue.   HEENT: No  headache, no vision change, no hearing change, No sore throat, +sinus pressure  Cardiac: No  chest pain, No  pressure, No palpitations, No  Orthopnea  Respiratory:  No  shortness of breath. No  Cough  Gastrointestinal: No  abdominal pain, No  nausea, No  vomiting  Musculoskeletal: No new myalgia/arthralgia  Neurologic: No  weakness, No  dizziness  Psychiatric: No  concerns with depression, No  concerns with anxiety  Exam:  BP 128/87   Pulse 74   Ht  (1.676 m)   Wt 191 lb (86.6 kg)  BMI 30.83 kg/m  recheck with manual cuff blood pressure was actually higher, 140/95  Constitutional: VS see above. General Appearance: alert, well-developed, well-nourished, NAD  Eyes: Normal lids and conjunctive, non-icteric sclera  Ears, Nose, Mouth, Throat: MMM, Normal external inspection ears/nares/mouth/lips/gums.   Neck: No masses, trachea midline. No thyroid enlargement.   Respiratory: Normal respiratory effort. no wheeze, no rhonchi, no rales  Cardiovascular: S1/S2 normal, no murmur, no rub/gallop auscultated. RRR. No lower extremity edema.   Musculoskeletal: Gait normal. No  clubbing/cyanosis of digits.   Neurological: Normal balance/coordination. No tremor.   Psychiatric: Normal judgment/insight. Normal mood and affect. Oriented x3.    Most recent labs reviewed, nothing on file for some time. BMP 1 year ago, normal renal function, elevated sugars, unknown if fasting. CBC 1 year ago is okay. LFTs from 2015, mildly increased ALT. Testosterone 214 in 10/2013.   ASSESSMENT/PLAN:   Elevated blood pressure reading - Lifestyle modifications discussed, if blood pressure elevated on recheck in 6 weeks would consider starting medications - Plan: CBC with Differential/Platelet, COMPLETE METABOLIC PANEL WITH GFR, Lipid panel, TSH  Elevated LFTs - Plan: CBC with Differential/Platelet, COMPLETE METABOLIC PANEL WITH GFR  Mild intermittent asthma without complication - Trial Singulair, may help seasonal allergies/sinus pressure. - Plan: montelukast (SINGULAIR) 10 MG tablet  Gilbert's syndrome - Plan: CBC with Differential/Platelet, COMPLETE METABOLIC PANEL WITH GFR  Onychomycosis - Try alternative topical medication, consider oral terbinafine if labs are okay - Plan: Efinaconazole 10 % SOLN  Seasonal allergic rhinitis, unspecified trigger - Plan: montelukast (SINGULAIR) 10 MG tablet  Status post cholecystectomy      Visit summary with medication list and pertinent instructions was printed for patient to review. All questions at time of visit were answered - patient instructed to contact office with any additional concerns. ER/RTC precautions were reviewed with the patient. Follow-up plan: Return in about 6 weeks (around 01/24/2017) for recheck blood pressure and weight, review lab results .

## 2016-12-14 NOTE — Telephone Encounter (Signed)
Spoke to patient gave him information as noted below. Patient verbally understood. Rhonda Cunningham,CMA

## 2016-12-21 ENCOUNTER — Ambulatory Visit: Payer: 59 | Admitting: Osteopathic Medicine

## 2016-12-23 ENCOUNTER — Other Ambulatory Visit: Payer: Self-pay

## 2016-12-23 DIAGNOSIS — K529 Noninfective gastroenteritis and colitis, unspecified: Secondary | ICD-10-CM

## 2016-12-23 MED ORDER — COLESEVELAM HCL 625 MG PO TABS: ORAL_TABLET | ORAL | 1 refills | Status: DC

## 2016-12-23 NOTE — Telephone Encounter (Signed)
Patient request refill for Welchol 625 mg. Patient stated that he takes 3 pills twice a day. A 90 day supply was sent to his pharmacy. Faelynn Wynder,CMA

## 2017-01-24 ENCOUNTER — Ambulatory Visit: Payer: Self-pay | Admitting: Osteopathic Medicine

## 2017-02-14 ENCOUNTER — Encounter: Payer: Self-pay | Admitting: Osteopathic Medicine

## 2017-02-14 ENCOUNTER — Ambulatory Visit (INDEPENDENT_AMBULATORY_CARE_PROVIDER_SITE_OTHER): Payer: 59 | Admitting: Osteopathic Medicine

## 2017-02-14 VITALS — BP 120/86 | HR 62 | Wt 194.0 lb

## 2017-02-14 DIAGNOSIS — Z7189 Other specified counseling: Secondary | ICD-10-CM

## 2017-02-14 DIAGNOSIS — Z712 Person consulting for explanation of examination or test findings: Secondary | ICD-10-CM

## 2017-02-14 DIAGNOSIS — R030 Elevated blood-pressure reading, without diagnosis of hypertension: Secondary | ICD-10-CM | POA: Diagnosis not present

## 2017-02-14 DIAGNOSIS — K529 Noninfective gastroenteritis and colitis, unspecified: Secondary | ICD-10-CM

## 2017-02-14 MED ORDER — AMBULATORY NON FORMULARY MEDICATION: 99 refills | Status: DC

## 2017-02-14 MED ORDER — COLESEVELAM HCL 625 MG PO TABS: ORAL_TABLET | ORAL | 3 refills | Status: DC

## 2017-02-14 NOTE — Progress Notes (Signed)
HPI: Allen Fisher is a 41 y.o. male  who presents to [place] today, 02/14/17,  for chief complaint of:  Chief Complaint  Patient presents with  . Follow-up    BLOOD PRESSURE    Patient here to follow-up on slightly elevated blood pressure. Has been diligent about diet/exercise modifications but has not made significant progress in weight loss, though he is not dangerously overweight. We reviewed lab results in detail  Past medical history, surgical history, social history and family history reviewed.  Patient Active Problem List   Diagnosis Date Noted  . Status post cholecystectomy 12/13/2016  . Seasonal allergic rhinitis 12/13/2016  . Elevated blood pressure reading 12/13/2016  . Onychomycosis 12/13/2016  . Acne vulgaris 07/04/2014  . Hypogonadism male 06/05/2014  . Gilbert's syndrome 06/05/2014  . IBS (irritable bowel syndrome) 06/05/2014  . Obesity 06/05/2014  . Elevated LFTs 06/05/2014  . Asthma 10/12/2010    Current medication list and allergy/intolerance information reviewed.   Current Outpatient Prescriptions on File Prior to Visit  Medication Sig Dispense Refill  . ciclopirox (PENLAC) 8 % solution Apply topically at bedtime. Apply over nail and surrounding skin. Apply daily over previous coat. After seven (7) days, may remove with alcohol and continue cycle. 6.6 mL 0  . fluticasone (FLONASE) 50 MCG/ACT nasal spray Place into both nostrils daily.    . montelukast (SINGULAIR) 10 MG tablet Take 1 tablet (10 mg total) by mouth at bedtime. 30 tablet 1  . Olopatadine HCl 0.2 % SOLN Apply 1 drop to each affected eye twice a day. For allergic conjunctivitis. 2.5 mL 1   No current facility-administered medications on file prior to visit.    No Known Allergies    Review of Systems:  Constitutional: No recent illness  HEENT: No  headache  Cardiac: No  chest pain, No  pressure, No palpitations  Respiratory:  No  shortness of breath.    Musculoskeletal: No new myalgia/arthralgia - occasional back pain due to gun belt use, requests specific adjustable piece of equipment since weight keeps fluctuating  Neurologic: No  weakness, No  Dizziness   Exam:  BP 120/86   Pulse 62   Wt 194 lb (88 kg)   BMI 31.31 kg/m   Constitutional: VS see above. General Appearance: alert, well-developed, well-nourished, NAD  Eyes: Normal lids and conjunctive, non-icteric sclera  Ears, Nose, Mouth, Throat: MMM, Normal external inspection ears/nares/mouth/lips/gums.  Neck: No masses, trachea midline.   Respiratory: Normal respiratory effort. no wheeze, no rhonchi, no rales  Cardiovascular: S1/S2 normal, no murmur, no rub/gallop auscultated. RRR.   Musculoskeletal: Gait normal. Symmetric and independent movement of all extremities  Neurological: Normal balance/coordination. No tremor.  Skin: warm, dry, intact.   Psychiatric: Normal judgment/insight. Normal mood and affect. Oriented x3.    Recent Results (from the past 2160 hour(s))  CBC with Differential/Platelet     Status: None   Collection Time: 12/13/16  8:27 AM  Result Value Ref Range   WBC 6.5 3.8 - 10.8 K/uL   RBC 5.56 4.20 - 5.80 MIL/uL   Hemoglobin 16.1 13.2 - 17.1 g/dL   HCT 48.3 38.5 - 50.0 %   MCV 86.9 80.0 - 100.0 fL   MCH 29.0 27.0 - 33.0 pg   MCHC 33.3 32.0 - 36.0 g/dL   RDW 13.9 11.0 - 15.0 %   Platelets 164 140 - 400 K/uL   MPV 12.2 7.5 - 12.5 fL   Neutro Abs 3,965 1,500 - 7,800 cells/uL  Lymphs Abs 1,885 850 - 3,900 cells/uL   Monocytes Absolute 520 200 - 950 cells/uL   Eosinophils Absolute 130 15 - 500 cells/uL   Basophils Absolute 0 0 - 200 cells/uL   Neutrophils Relative % 61 %   Lymphocytes Relative 29 %   Monocytes Relative 8 %   Eosinophils Relative 2 %   Basophils Relative 0 %   Smear Review Criteria for review not met   COMPLETE METABOLIC PANEL WITH GFR     Status: Abnormal   Collection Time: 12/13/16  8:27 AM  Result Value Ref Range    Sodium 143 135 - 146 mmol/L   Potassium 4.5 3.5 - 5.3 mmol/L   Chloride 110 98 - 110 mmol/L   CO2 19 (L) 20 - 31 mmol/L   Glucose, Bld 83 65 - 99 mg/dL   BUN 12 7 - 25 mg/dL   Creat 0.95 0.60 - 1.35 mg/dL   Total Bilirubin 1.8 (H) 0.2 - 1.2 mg/dL   Alkaline Phosphatase 39 (L) 40 - 115 U/L   AST 17 10 - 40 U/L   ALT 34 9 - 46 U/L   Total Protein 6.3 6.1 - 8.1 g/dL   Albumin 3.9 3.6 - 5.1 g/dL   Calcium 8.9 8.6 - 10.3 mg/dL   GFR, Est African American >89 >=60 mL/min   GFR, Est Non African American >89 >=60 mL/min  Lipid panel     Status: Abnormal   Collection Time: 12/13/16  8:27 AM  Result Value Ref Range   Cholesterol 135 <200 mg/dL   Triglycerides 91 <150 mg/dL   HDL 40 (L) >40 mg/dL   Total CHOL/HDL Ratio 3.4 <5.0 Ratio   VLDL 18 <30 mg/dL   LDL Cholesterol 77 <100 mg/dL  TSH     Status: None   Collection Time: 12/13/16  8:27 AM  Result Value Ref Range   TSH 1.16 0.40 - 4.50 mIU/L    No results found.  No flowsheet data found.  Depression screen PHQ 2/9 12/13/2016  Decreased Interest 0  Down, Depressed, Hopeless 0  PHQ - 2 Score 0      ASSESSMENT/PLAN:   Elevated blood pressure reading - Borderline diastolic, continue diet/lifestyle, will hold off on adding BP medication for now, recheck 6 months  Chronic diarrhea - Refills in place for WelChol - Plan: colesevelam (WELCHOL) 625 MG tablet  Encounter to discuss test results  Gilbert's syndrome     Follow-up plan: Return in about 6 months (around 08/17/2017) for recheck blood pressure - sooner if needed .  Visit summary with medication list and pertinent instructions was printed for patient to review, alert [place] if any changes needed. All questions at time of visit were answered - patient instructed to contact office with any additional concerns. ER/RTC precautions were reviewed with the patient and understanding verbalized.   Note: Total time spent 10 minutes, greater than 50% of the visit was spent face-to-face  counseling and coordinating care for the following: The primary encounter diagnosis was Elevated blood pressure reading. Diagnoses of Chronic diarrhea, Encounter to discuss test results, and Gilbert's syndrome were also pertinent to this visit.Marland Kitchen

## 2017-05-27 ENCOUNTER — Encounter: Payer: Self-pay | Admitting: Emergency Medicine

## 2017-05-27 ENCOUNTER — Emergency Department
Admission: EM | Admit: 2017-05-27 | Discharge: 2017-05-27 | Disposition: A | Discharge status: Home / Self Care | Payer: 59 | Source: Home / Self Care

## 2017-05-27 DIAGNOSIS — L089 Local infection of the skin and subcutaneous tissue, unspecified: Secondary | ICD-10-CM | POA: Diagnosis not present

## 2017-05-27 MED ORDER — SULFAMETHOXAZOLE-TRIMETHOPRIM 800-160 MG PO TABS: 1.0000 | ORAL_TABLET | Freq: Two times a day (BID) | ORAL | 0 refills | Status: AC

## 2017-05-27 MED ORDER — KETOCONAZOLE 2 % EX CREA: 1.0000 "application " | TOPICAL_CREAM | Freq: Two times a day (BID) | CUTANEOUS | 0 refills | Status: DC

## 2017-05-27 NOTE — ED Triage Notes (Signed)
Pain between ring and pinky toe x 3 weeks

## 2017-05-27 NOTE — Discharge Instructions (Signed)
Return if any problems.

## 2017-05-27 NOTE — ED Provider Notes (Signed)
[name]    CSN: [id number] Arrival date & time: 05/27/17  1510     History   Chief Complaint Chief Complaint  Patient presents with  . Toe Pain    HPI Allen Fisher is a 41 y.o. male.   The history is provided by the patient.  Toe Pain  This is a new problem. Episode onset: 3 weeks. The problem occurs constantly. The problem has been gradually worsening. Nothing aggravates the symptoms. Nothing relieves the symptoms. He has tried nothing for the symptoms.  Pt complains of fungal infection between his toes.  Pt reports no relief with tinactin.  Pt reports increased redness and burning  Past Medical History:  Diagnosis Date  . Environmental allergies   . Pneumonia     Patient Active Problem List   Diagnosis Date Noted  . Status post cholecystectomy 12/13/2016  . Seasonal allergic rhinitis 12/13/2016  . Elevated blood pressure reading 12/13/2016  . Onychomycosis 12/13/2016  . Acne vulgaris 07/04/2014  . Hypogonadism male 06/05/2014  . Gilbert's syndrome 06/05/2014  . IBS (irritable bowel syndrome) 06/05/2014  . Obesity 06/05/2014  . Elevated LFTs 06/05/2014  . Asthma 10/12/2010    Past Surgical History:  Procedure Laterality Date  . BACK SURGERY    . CHOLECYSTECTOMY    . KNEE SURGERY    . SHOULDER SURGERY         Home Medications    Prior to Admission medications   Medication Sig Start Date End Date Taking? Authorizing Provider  colesevelam [facility]) 625 MG tablet Take 3 tablets by mouth two times daily with a meal. 02/14/17  Yes Sunnie Nielsen, DO  ketoconazole (NIZORAL) 2 % cream Apply 1 application topically 2 (two) times daily. 05/27/17   Elson Areas, PA-C  sulfamethoxazole-trimethoprim (BACTRIM DS,SEPTRA DS) 800-160 MG tablet Take 1 tablet by mouth 2 (two) times daily. 05/27/17 06/03/17  Elson Areas, PA-C    Family History Family History  Problem Relation Age of Onset  . Cancer Mother        uterine  . Arrhythmia Mother      Social History Social History  Substance Use Topics  . Smoking status: Never Smoker  . Smokeless tobacco: Current User    Types: Snuff  . Alcohol use No     Allergies   Patient has no known allergies.   Review of Systems Review of Systems  Skin: Positive for wound.  All other systems reviewed and are negative.    Physical Exam Triage Vital Signs ED Triage Vitals  Enc Vitals Group     BP 05/27/17 1538 (!) 143/93     Pulse Rate 05/27/17 1538 76     Resp --      Temp 05/27/17 1538 98.5 F (36.9 C)     Temp Source 05/27/17 1538 Oral     SpO2 05/27/17 1538 97 %     Weight 05/27/17 1539 189 lb (85.7 kg)     Height 05/27/17 1539  (1.676 m)     Head Circumference --      Peak Flow --      Pain Score 05/27/17 1539 3     Pain Loc --      Pain Edu? --      Excl. in GC? --    No data found.   Updated Vital Signs BP (!) 143/93 (BP Location: Left Arm)   Pulse 76   Temp 98.5 F (36.9 C) (Oral)   Ht  (  1.676 m)   Wt 189 lb (85.7 kg)   SpO2 97%   BMI 30.51 kg/m   Visual Acuity Right Eye Distance:   Left Eye Distance:   Bilateral Distance:    Right Eye Near:   Left Eye Near:    Bilateral Near:     Physical Exam  Constitutional: He appears well-developed.  Musculoskeletal: He exhibits tenderness.  White skin between 4th and 5th toes left foot, erythema surrounding area.    Neurological: He is alert.  Skin: Skin is warm.  Psychiatric: He has a normal mood and affect.  Nursing note and vitals reviewed.    UC Treatments / Results  Labs (all labs ordered are listed, but only abnormal results are displayed) Labs Reviewed - No data to display  EKG  EKG Interpretation None       Radiology No results found.  Procedures Procedures (including critical care time)  Medications Ordered in UC Medications - No data to display   Initial Impression / Assessment and Plan / UC Course  I have reviewed the triage vital signs and the nursing  notes.  Pertinent labs & imaging results that were available during my care of the patient were reviewed by me and considered in my medical decision making (see chart for details).     I suspect secondary bacterial infection.  I will try ketoconazole topically and bactrim for possible secondary infection.  Pt counseled on keeping area dry, dry white socks.   Final Clinical Impressions(s) / UC Diagnoses   Final diagnoses:  Local skin infection    New Prescriptions New Prescriptions   KETOCONAZOLE (NIZORAL) 2 % CREAM    Apply 1 application topically 2 (two) times daily.   SULFAMETHOXAZOLE-TRIMETHOPRIM (BACTRIM DS,SEPTRA DS) 800-160 MG TABLET    Take 1 tablet by mouth 2 (two) times daily.     Controlled Substance Prescriptions [place] Controlled Substance Registry consulted? Not Applicable  An After Visit Summary was printed and given to the patient.    Elson Areas, [place] 05/27/17 1614

## 2017-05-28 ENCOUNTER — Telehealth: Payer: Self-pay | Admitting: Emergency Medicine

## 2017-05-28 MED ORDER — TERBINAFINE HCL 250 MG PO TABS: 250.0000 mg | ORAL_TABLET | Freq: Every day | ORAL | 0 refills | Status: DC

## 2017-05-28 MED ORDER — TERBINAFINE HCL 1 % EX CREA: 1.0000 "application " | TOPICAL_CREAM | Freq: Two times a day (BID) | CUTANEOUS | 0 refills | Status: DC

## 2017-05-28 MED ORDER — CEPHALEXIN 500 MG PO CAPS: 500.0000 mg | ORAL_CAPSULE | Freq: Three times a day (TID) | ORAL | 0 refills | Status: DC

## 2017-05-28 NOTE — Telephone Encounter (Signed)
Was seen yesterday here in urgent care by Langston Masker, diagnosed with dermatitis of foot, likely mixed bacterial and fungal. He was prescribed Septra DS twice a day and topical ketoconazole. Patient just called in and spoke with Amy (nurse), stated the rash is worse, "spreading"  Please advise: 1) stop the topical ketoconazole cream. 2) stop the Septra DS (antibiotic pill that was prescribed. 3) I'm sending in 3 new prescriptions: 4) Lamisil rx  pill by mouth (this is an antifungal), as well as Lamisil rx cream. 5) different antibiotic:rx: Cephalexin 6) keep the areas clean and dry 7) follow-up with dermatologist within 3 days

## 2017-08-17 ENCOUNTER — Ambulatory Visit: Payer: Self-pay | Admitting: Osteopathic Medicine

## 2017-08-17 DIAGNOSIS — Z0189 Encounter for other specified special examinations: Secondary | ICD-10-CM

## 2017-09-04 DIAGNOSIS — M7541 Impingement syndrome of right shoulder: Secondary | ICD-10-CM | POA: Insufficient documentation

## 2017-09-13 ENCOUNTER — Encounter: Payer: Self-pay | Admitting: *Deleted

## 2017-09-13 ENCOUNTER — Other Ambulatory Visit: Payer: Self-pay

## 2017-09-13 ENCOUNTER — Emergency Department
Admission: EM | Admit: 2017-09-13 | Discharge: 2017-09-13 | Disposition: A | Discharge status: Home / Self Care | Payer: 59 | Source: Home / Self Care | Attending: Family Medicine | Admitting: Family Medicine

## 2017-09-13 DIAGNOSIS — J069 Acute upper respiratory infection, unspecified: Secondary | ICD-10-CM

## 2017-09-13 DIAGNOSIS — J9801 Acute bronchospasm: Secondary | ICD-10-CM

## 2017-09-13 MED ORDER — PREDNISONE 20 MG PO TABS: ORAL_TABLET | ORAL | 0 refills | Status: DC

## 2017-09-13 MED ORDER — METHYLPREDNISOLONE ACETATE 80 MG/ML IJ SUSP
80.0000 mg | Freq: Once | INTRAMUSCULAR | Status: AC
  Administered 2017-09-13: 80 mg via INTRAMUSCULAR

## 2017-09-13 MED ORDER — AZITHROMYCIN 250 MG PO TABS: 250.0000 mg | ORAL_TABLET | Freq: Every day | ORAL | 0 refills | Status: DC

## 2017-09-13 NOTE — ED Provider Notes (Signed)
[name]    CSN: [id number] Arrival date & time: 09/13/17  1815     History   Chief Complaint Chief Complaint  Patient presents with  . Cough    HPI Allen Fisher is a 42 y.o. male.   HPI  Allen Fisher is a 42 y.o. male presenting to UC with c/o 10 days of nonproductive cough, worsening over the last 2 nights with wheezing and centralized chest tightness.  Hx of same about 1 year ago. Pt states he does very well with prednisone shot and pills.  He has not needed an inhaler for many years as the shot and pills worked well last time. Denies fever, chills, n/v/d.  He does not think he needs an antibiotic at this time but he notes he is leaving for [employer] and wants to get on prednisone before he leaves.      Past Medical History:  Diagnosis Date  . Environmental allergies   . Pneumonia     Patient Active Problem List   Diagnosis Date Noted  . Status post cholecystectomy 12/13/2016  . Seasonal allergic rhinitis 12/13/2016  . Elevated blood pressure reading 12/13/2016  . Onychomycosis 12/13/2016  . Acne vulgaris 07/04/2014  . Hypogonadism male 06/05/2014  . Gilbert's syndrome 06/05/2014  . IBS (irritable bowel syndrome) 06/05/2014  . Obesity 06/05/2014  . Elevated LFTs 06/05/2014  . Asthma 10/12/2010    Past Surgical History:  Procedure Laterality Date  . BACK SURGERY    . CHOLECYSTECTOMY    . KNEE SURGERY    . SHOULDER SURGERY         Home Medications    Prior to Admission medications   Medication Sig Start Date End Date Taking? Authorizing Provider  colesevelam [facility]) 625 MG tablet Take 3 tablets by mouth two times daily with a meal. 02/14/17  Yes Sunnie Nielsen, DO  Omega-3 Fatty Acids (FISH OIL) 1000 MG CAPS Take by mouth.   Yes [provider]  TESTOSTERONE IM Inject into the muscle.   Yes [provider]  azithromycin (ZITHROMAX) 250 MG tablet Take 1 tablet (250 mg total) by mouth daily. Take first 2  tablets together, then 1 every day until finished. 09/13/17   Lurene Shadow, PA-C  predniSONE (DELTASONE) 20 MG tablet 3 tabs po day one, then 2 po daily x 4 days 09/13/17   Lurene Shadow, PA-C    Family History Family History  Problem Relation Age of Onset  . Cancer Mother        uterine  . Arrhythmia Mother     Social History Social History   Tobacco Use  . Smoking status: Never Smoker  . Smokeless tobacco: Current User    Types: Snuff  Substance Use Topics  . Alcohol use: No    Alcohol/week: 0.0 oz  . Drug use: No     Allergies   Patient has no known allergies.   Review of Systems Review of Systems  Constitutional: Negative for chills and fever.  HENT: Positive for congestion (minimal). Negative for ear pain, sore throat, trouble swallowing and voice change.   Respiratory: Positive for cough, chest tightness and wheezing. Negative for shortness of breath.   Cardiovascular: Negative for chest pain and palpitations.  Gastrointestinal: Negative for abdominal pain, diarrhea, nausea and vomiting.  Musculoskeletal: Negative for arthralgias, back pain and myalgias.  Skin: Negative for rash.     Physical Exam Triage Vital Signs ED Triage Vitals  Enc Vitals Group  BP 09/13/17 1831 129/82     Pulse Rate 09/13/17 1831 93     Resp 09/13/17 1831 18     Temp 09/13/17 1831 97.9 F (36.6 C)     Temp Source 09/13/17 1831 Oral     SpO2 09/13/17 1831 98 %     Weight 09/13/17 1833 190 lb (86.2 kg)     Height --      Head Circumference --      Peak Flow --      Pain Score 09/13/17 1832 0     Pain Loc --      Pain Edu? --      Excl. in GC? --    No data found.  Updated Vital Signs BP 129/82 (BP Location: Right Arm)   Pulse 93   Temp 97.9 F (36.6 C) (Oral)   Resp 18   Wt 190 lb (86.2 kg)   SpO2 98%   BMI 30.67 kg/m   Visual Acuity Right Eye Distance:   Left Eye Distance:   Bilateral Distance:    Right Eye Near:   Left Eye Near:    Bilateral Near:      Physical Exam  Constitutional: He is oriented to person, place, and time. He appears well-developed and well-nourished. No distress.  HENT:  Head: Normocephalic and atraumatic.  Right Ear: Tympanic membrane normal.  Left Ear: Tympanic membrane normal.  Nose: Nose normal.  Mouth/Throat: Uvula is midline, oropharynx is clear and moist and mucous membranes are normal.  Eyes: EOM are normal.  Neck: Normal range of motion. Neck supple.  Cardiovascular: Normal rate and regular rhythm.  Pulmonary/Chest: Effort normal. No stridor. No respiratory distress. He has wheezes (faint diffuse). He has no rales.  Musculoskeletal: Normal range of motion.  Lymphadenopathy:    He has no cervical adenopathy.  Neurological: He is alert and oriented to person, place, and time.  Skin: Skin is warm and dry. He is not diaphoretic.  Psychiatric: He has a normal mood and affect. His behavior is normal.  Nursing note and vitals reviewed.    UC Treatments / Results  Labs (all labs ordered are listed, but only abnormal results are displayed) Labs Reviewed - No data to display  EKG  EKG Interpretation None       Radiology No results found.  Procedures Procedures (including critical care time)  Medications Ordered in UC Medications  methylPREDNISolone acetate (DEPO-MEDROL) injection 80 mg (80 mg Intramuscular Given 09/13/17 1857)     Initial Impression / Assessment and Plan / UC Course  I have reviewed the triage vital signs and the nursing notes.  Pertinent labs & imaging results that were available during my care of the patient were reviewed by me and considered in my medical decision making (see chart for details).     Depo-medrol 80mg  IM given in UC as pt had this last visit and did well Discharged with oral prednisone and a prescription to hold for azithromycin as pt will be out of town for 1 week. May fill if symptoms not improving with the prednisone, or he develops worsening symptoms  such as a fever.   Final Clinical Impressions(s) / UC Diagnoses   Final diagnoses:  Upper respiratory tract infection, unspecified type  Bronchospasm    ED Discharge Orders        Ordered    predniSONE (DELTASONE) 20 MG tablet     09/13/17 1853    azithromycin (ZITHROMAX) 250 MG tablet  Daily  09/13/17 1853       Controlled Substance Prescriptions [place] Controlled Substance Registry consulted? Not Applicable   Rolla Plate 09/14/17 1914

## 2017-09-13 NOTE — Discharge Instructions (Signed)
°  You were given a shot of solumedrol (a steroid) today to help with inflammation in your lungs to help you breath better and to help with your cough.  You have been prescribed 5 days of prednisone, an oral steroid.  You may start this medication tomorrow with breakfast.     If you develop persistent fever >100.4*F worsening productive cough, chest tightness, or worsening fatigue, you may start taking the antibiotic.  If you start the antibiotic, please be sure to complete the entire course to make sure the infection does not come back.  If you are still not improving after the antibiotic, please be sure to follow up with your primary care provider.

## 2017-09-13 NOTE — ED Triage Notes (Signed)
Pt c/o nonproducitve cough x 10 days with wheezing x 2 nights . Denies fever.

## 2017-12-09 ENCOUNTER — Emergency Department
Admission: EM | Admit: 2017-12-09 | Discharge: 2017-12-09 | Disposition: A | Discharge status: Home / Self Care | Payer: 59 | Source: Home / Self Care | Attending: Family Medicine | Admitting: Family Medicine

## 2017-12-09 ENCOUNTER — Other Ambulatory Visit: Payer: Self-pay

## 2017-12-09 ENCOUNTER — Encounter: Payer: Self-pay | Admitting: Emergency Medicine

## 2017-12-09 DIAGNOSIS — R0989 Other specified symptoms and signs involving the circulatory and respiratory systems: Secondary | ICD-10-CM

## 2017-12-09 DIAGNOSIS — R21 Rash and other nonspecific skin eruption: Secondary | ICD-10-CM

## 2017-12-09 DIAGNOSIS — R0981 Nasal congestion: Secondary | ICD-10-CM

## 2017-12-09 MED ORDER — METHYLPREDNISOLONE ACETATE 80 MG/ML IJ SUSP
80.0000 mg | Freq: Once | INTRAMUSCULAR | Status: AC
Start: 2017-12-09 — End: 2017-12-09
  Administered 2017-12-09: 80 mg via INTRAMUSCULAR

## 2017-12-09 MED ORDER — PREDNISONE 20 MG PO TABS: ORAL_TABLET | ORAL | 0 refills | Status: DC

## 2017-12-09 MED ORDER — TRIAMCINOLONE ACETONIDE 0.1 % EX CREA: TOPICAL_CREAM | CUTANEOUS | 0 refills | Status: DC

## 2017-12-09 MED ORDER — MUPIROCIN 2 % EX OINT: TOPICAL_OINTMENT | CUTANEOUS | 0 refills | Status: DC

## 2017-12-09 NOTE — ED Triage Notes (Signed)
Patient has known seasonal allergies; his usual OTCs not working; this usually leads to need for rx intervention or it develops into bronchitis. He also has small rash area right antecubital area for 3 weeks not responding.

## 2017-12-09 NOTE — ED Provider Notes (Signed)
[name]    CSN: [id number] Arrival date & time: 12/09/17  1454     History   Chief Complaint Chief Complaint  Patient presents with  . Nasal Congestion  . Cough  . Rash    HPI Allen Fisher is a 42 y.o. male.   HPI Allen Fisher is a 42 y.o. male presenting to UC with c/o worsening nasal congestion with Right sided neck soreness hear his ear and mild chest congestion.  Hx of seasonal allergies. He has been taking OTC allergy medication at night and in the morning along with generic Flonase.  He is requesting a steroid shot and pills. He notes if he "catches this early" it helps prevent him from getting bronchitis.    Rash: he is also c/o a small red bump on his Right antecubital space and Right forearm that started about 3 weeks ago.  He initially thought he had an ingrown hair but states he has tried "no shave gel" he has at home w/o relief.  Mild irritation at both sites but denies pain or worsening redness. He did notice a small amount of drainage from both initially but none recently. No new soaps, lotions or medications.  Past Medical History:  Diagnosis Date  . Environmental allergies   . Pneumonia     Patient Active Problem List   Diagnosis Date Noted  . Status post cholecystectomy 12/13/2016  . Seasonal allergic rhinitis 12/13/2016  . Elevated blood pressure reading 12/13/2016  . Onychomycosis 12/13/2016  . Acne vulgaris 07/04/2014  . Hypogonadism male 06/05/2014  . Gilbert's syndrome 06/05/2014  . IBS (irritable bowel syndrome) 06/05/2014  . Obesity 06/05/2014  . Elevated LFTs 06/05/2014  . Asthma 10/12/2010    Past Surgical History:  Procedure Laterality Date  . BACK SURGERY    . CHOLECYSTECTOMY    . KNEE SURGERY    . SHOULDER SURGERY         Home Medications    Prior to Admission medications   Medication Sig Start Date End Date Taking? Authorizing Provider  azithromycin (ZITHROMAX) 250 MG tablet Take 1 tablet (250 mg total) by  mouth daily. Take first 2 tablets together, then 1 every day until finished. 09/13/17   Lurene Shadow, PA-C  colesevelam [facility]) 625 MG tablet Take 3 tablets by mouth two times daily with a meal. 02/14/17   Sunnie Nielsen, DO  mupirocin ointment (BACTROBAN) 2 % Apply thin layer to rash 3 times daily for 5 days 12/09/17   Lurene Shadow, PA-C  Omega-3 Fatty Acids (FISH OIL) 1000 MG CAPS Take by mouth.    [provider]  predniSONE (DELTASONE) 20 MG tablet 3 tabs po day one, then 2 po daily x 4 days 12/09/17   Lurene Shadow, PA-C  TESTOSTERONE IM Inject into the muscle.    [provider]  triamcinolone cream (KENALOG) 0.1 % Apply to rash 2-3 times daily as needed for inflammation and itching 12/09/17   Lurene Shadow, PA-C    Family History Family History  Problem Relation Age of Onset  . Cancer Mother        uterine  . Arrhythmia Mother     Social History Social History   Tobacco Use  . Smoking status: Never Smoker  . Smokeless tobacco: Current User    Types: Snuff  Substance Use Topics  . Alcohol use: No    Alcohol/week: 0.0 oz  . Drug use: No     Allergies   Patient  has no known allergies.   Review of Systems Review of Systems  Constitutional: Negative for chills and fever.  HENT: Positive for congestion, ear pain (Mild Right soreness), rhinorrhea, sinus pressure and sneezing. Negative for sinus pain and sore throat.   Respiratory: Positive for cough ( minimal). Negative for shortness of breath and wheezing.   Gastrointestinal: Negative for diarrhea, nausea and vomiting.  Musculoskeletal: Negative for arthralgias and myalgias.  Skin: Positive for rash. Negative for wound.     Physical Exam Triage Vital Signs ED Triage Vitals  Enc Vitals Group     BP 12/09/17 1509 120/86     Pulse Rate 12/09/17 1509 (!) 110     Resp 12/09/17 1509 18     Temp 12/09/17 1509 97.6 F (36.4 C)     Temp Source 12/09/17 1509 Oral     SpO2 12/09/17 1509 97 %      Weight 12/09/17 1512 180 lb (81.6 kg)     Height 12/09/17 1512  (1.676 m)     Head Circumference --      Peak Flow --      Pain Score 12/09/17 1511 0     Pain Loc --      Pain Edu? --      Excl. in GC? --    No data found.  Updated Vital Signs BP 120/86 (BP Location: Left Arm)   Pulse (!) 110   Temp 97.6 F (36.4 C) (Oral)   Resp 18   Ht  (1.676 m)   Wt 180 lb (81.6 kg)   SpO2 97%   BMI 29.05 kg/m   Visual Acuity Right Eye Distance:   Left Eye Distance:   Bilateral Distance:    Right Eye Near:   Left Eye Near:    Bilateral Near:     Physical Exam  Constitutional: He is oriented to person, place, and time. He appears well-developed and well-nourished. No distress.  HENT:  Head: Normocephalic and atraumatic.  Right Ear: Tympanic membrane normal.  Left Ear: Tympanic membrane normal.  Nose: Mucosal edema present. Right sinus exhibits no maxillary sinus tenderness and no frontal sinus tenderness. Left sinus exhibits no maxillary sinus tenderness and no frontal sinus tenderness.  Mouth/Throat: Uvula is midline, oropharynx is clear and moist and mucous membranes are normal.  Eyes: EOM are normal.  Neck: Normal range of motion. Neck supple.  Cardiovascular: Normal rate and regular rhythm.  Pulmonary/Chest: Effort normal and breath sounds normal. No stridor. No respiratory distress. He has no wheezes. He has no rales.  Musculoskeletal: Normal range of motion.  Lymphadenopathy:    He has no cervical adenopathy.  Neurological: He is alert and oriented to person, place, and time.  Skin: Skin is warm and dry. Rash noted. He is not diaphoretic. There is erythema.  Right anticubital space: 2cm linear raised area of erythema. No bleeding or drainage. No fluctuance. Non-tender. Right forearm: 3mm erythematous scabbed over papule. Non-tender. No red streaking.  Psychiatric: He has a normal mood and affect. His behavior is normal.  Nursing note and vitals  reviewed.    UC Treatments / Results  Labs (all labs ordered are listed, but only abnormal results are displayed) Labs Reviewed - No data to display  EKG None Radiology No results found.  Procedures Procedures (including critical care time)  Medications Ordered in UC Medications  methylPREDNISolone acetate (DEPO-MEDROL) injection 80 mg (80 mg Intramuscular Given 12/09/17 1534)     Initial Impression / Assessment and Plan /  UC Course  I have reviewed the triage vital signs and the nursing notes.  Pertinent labs & imaging results that were available during my care of the patient were reviewed by me and considered in my medical decision making (see chart for details).     Pt treated with prednisone for exacerbation of his seasonal allergies.  Rash- questionable contact dermatitis, secondary early soft tissue skin infection  Home care instructions provided F/u with PCP in 1-2 weeks if not improving.   Final Clinical Impressions(s) / UC Diagnoses   Final diagnoses:  Nasal congestion  Rash and nonspecific skin eruption  Chest congestion    ED Discharge Orders        Ordered    predniSONE (DELTASONE) 20 MG tablet     12/09/17 1522    triamcinolone cream (KENALOG) 0.1 %     12/09/17 1522    mupirocin ointment (BACTROBAN) 2 %     12/09/17 1522       Controlled Substance Prescriptions [place] Controlled Substance Registry consulted? Not Applicable   Rolla Plate 12/09/17 1623

## 2018-04-05 MED FILL — CLINDAMYCIN PHOSP 1% LOTION: 1 | 20 days supply | Qty: 60 | Fill #0

## 2018-04-11 MED FILL — CEPHALEXIN 500 MG CAPSULE: 500 | 30 days supply | Qty: 30 | Fill #0

## 2018-05-27 ENCOUNTER — Other Ambulatory Visit: Payer: Self-pay

## 2018-05-27 ENCOUNTER — Emergency Department
Admission: EM | Admit: 2018-05-27 | Discharge: 2018-05-27 | Disposition: A | Discharge status: Home / Self Care | Payer: 59 | Source: Home / Self Care | Attending: Emergency Medicine | Admitting: Emergency Medicine

## 2018-05-27 DIAGNOSIS — L03119 Cellulitis of unspecified part of limb: Secondary | ICD-10-CM | POA: Diagnosis not present

## 2018-05-27 DIAGNOSIS — L0292 Furuncle, unspecified: Secondary | ICD-10-CM | POA: Diagnosis not present

## 2018-05-27 DIAGNOSIS — L02419 Cutaneous abscess of limb, unspecified: Secondary | ICD-10-CM | POA: Diagnosis not present

## 2018-05-27 MED ORDER — DOXYCYCLINE HYCLATE 100 MG PO CAPS: 100.0000 mg | ORAL_CAPSULE | Freq: Two times a day (BID) | ORAL | 1 refills | Status: DC

## 2018-05-27 NOTE — ED Provider Notes (Signed)
[name]    CSN: [id number] Arrival date & time: 05/27/18  1510 Sunday    History   Chief Complaint Chief Complaint  Patient presents with  . Recurrent Skin Infections    HPI Allen Fisher is a 42 y.o. male.   HPI Pt c/o two "boils", located on R groin and L thigh that have been giving him problems, waxing and waning since July of this year.   Saw a dermatologist in Aug, was rx'd cephalexin p.o. and clindamycin topically with no relief.  Has tried to drain the one on left thigh, himself but it keeps returning. He has prior history of facial acne and folliculitis.  He has a beard to avoid shaving his face, which can flareup the acne and folliculitis. He is monogamous with wife.   Associated symptoms: Denies fever or chills or nausea or vomiting.  No chest pain or shortness of breath.  Has not noted any red streaks.  Denies dysuria or hematuria or urinary frequency.  No urethral discharge.  No penile lesions.  Denies itch. Past Medical History:  Diagnosis Date  . Environmental allergies   . Pneumonia     Patient Active Problem List   Diagnosis Date Noted  . Status post cholecystectomy 12/13/2016  . Seasonal allergic rhinitis 12/13/2016  . Elevated blood pressure reading 12/13/2016  . Onychomycosis 12/13/2016  . Acne vulgaris 07/04/2014  . Hypogonadism male 06/05/2014  . Gilbert's syndrome 06/05/2014  . IBS (irritable bowel syndrome) 06/05/2014  . Obesity 06/05/2014  . Elevated LFTs 06/05/2014  . Asthma 10/12/2010    Past Surgical History:  Procedure Laterality Date  . BACK SURGERY    . CHOLECYSTECTOMY    . KNEE SURGERY    . SHOULDER SURGERY         Home Medications    Prior to Admission medications   Medication Sig Start Date End Date Taking? Authorizing Provider  colesevelam [facility]) 625 MG tablet Take 3 tablets by mouth two times daily with a meal. 02/14/17   Sunnie Nielsen, DO  doxycycline (VIBRAMYCIN) 100 MG capsule Take 1 capsule  (100 mg total) by mouth 2 (two) times daily. 05/27/18   Lajean Manes, MD  Omega-3 Fatty Acids (FISH OIL) 1000 MG CAPS Take by mouth.    [provider]  TESTOSTERONE IM Inject into the muscle.    [provider]    Family History Family History  Problem Relation Age of Onset  . Cancer Mother        uterine  . Arrhythmia Mother     Social History Social History   Tobacco Use  . Smoking status: Never Smoker  . Smokeless tobacco: Current User    Types: Snuff  Substance Use Topics  . Alcohol use: No    Alcohol/week: 0.0 standard drinks  . Drug use: No     Allergies   Patient has no known allergies.   Review of Systems Review of Systems  All other systems reviewed and are negative.    Physical Exam Triage Vital Signs ED Triage Vitals [05/27/18 1529]  Enc Vitals Group     BP 132/90     Pulse Rate 93     Resp      Temp      Temp src      SpO2 98 %     Weight 186 lb (84.4 kg)     Height 5\' 6"  (1.676 m)     Head Circumference      Peak Flow  Pain Score 1     Pain Loc      Pain Edu?      Excl. in GC?    No data found.  Updated Vital Signs BP 132/90 (BP Location: Right Arm)   Pulse 93   Ht 5\' 6"  (1.676 m)   Wt 84.4 kg   SpO2 98%   BMI 30.02 kg/m   Visual Acuity Right Eye Distance:   Left Eye Distance:   Bilateral Distance:    Right Eye Near:   Left Eye Near:    Bilateral Near:     Physical Exam  Constitutional: He is oriented to person, place, and time. He appears well-developed and well-nourished. No distress.  HENT:  Head: Normocephalic and atraumatic.  Eyes: Pupils are equal, round, and reactive to light. No scleral icterus.  Neck: Normal range of motion. Neck supple.  Cardiovascular: Normal rate and regular rhythm.  Pulmonary/Chest: Effort normal.  Abdominal: He exhibits no distension.  Neurological: He is alert and oriented to person, place, and time. No cranial nerve deficit.  Skin: Skin is warm and dry. He is not  diaphoretic.     Throughout the inguinal areas bilaterally, there are other tiny areas of inflamed hair follicles but no drainage.  Psychiatric: He has a normal mood and affect. His behavior is normal.  Vitals reviewed.    UC Treatments / Results  Labs (all labs ordered are listed, but only abnormal results are displayed) Labs Reviewed  WOUND CULTURE    EKG None  Radiology No results found.  Procedures Incision and Drainage Date/Time: 05/27/2018 4:19 PM Performed by: Lajean Manes, MD Authorized by: Lajean Manes, MD   Consent:    Consent obtained:  Verbal   Consent given by:  Patient   Risks discussed:  Bleeding, infection, incomplete drainage and pain   Alternatives discussed:  No treatment, delayed treatment, alternative treatment, observation and referral Location:    Type:  Abscess (Infected cyst/hair follicle)   Size:  10 mm   Location:  Lower extremity   Lower extremity location:  Leg   Leg location:  L upper leg Pre-procedure details:    Skin preparation:  Betadine Anesthesia (see MAR for exact dosages):    Anesthesia method:  Local infiltration   Local anesthetic:  Lidocaine 2% WITH epi Procedure type:    Complexity:  Simple Procedure details:    Incision types:  Single straight   Incision depth:  Subcutaneous   Scalpel blade:  11   Wound management:  Probed and deloculated (Wound culture sent)   Drainage:  Serosanguinous (With a tiny amount of pus/cystic material)   Drainage amount:  Moderate   Wound treatment:  Wound left open (Using peroxide soaked Q-tip, I inserted a Q-tip inside abscess cavity and cleaned area.  No foreign body or other material removed.) Post-procedure details:    Patient tolerance of procedure:  Tolerated well, no immediate complications Comments:     Wound dressed with nonstick dressing and Coban. Wound aftercare discussed and written instructions given.     Medications Ordered in UC Medications - No data to  display  Initial Impression / Assessment and Plan / UC Course  I have reviewed the triage vital signs and the nursing notes.  Pertinent labs & imaging results that were available during my care of the patient were reviewed by me and considered in my medical decision making (see chart for details).     Incision and drainage of the 10 x 10 mm left thigh  abscess, see details above.  Final Clinical Impressions(s) / UC Diagnoses   Final diagnoses:  Deep folliculitis  Cellulitis and abscess of leg     Discharge Instructions     You have folliculitis, with a small abscess/infected cyst left thigh. Today, we performed incision and drainage of the infected cyst left thigh.  Wound care: Every day, remove dressing, can clean inside wound with peroxide on a Q-tip, then clean the skin gently with antibacterial soap, gently rinse and pat dry and redress. Even when this heals an infection is gone, its possible that you still have a cyst that might need deeper excision in the future. Antibiotic: Doxycycline twice a day by mouth for 10 days.  One refill written, and its okay to take 1 doxycycline daily for low-grade folliculitis.-However you need to follow-up with PCP or dermatologist to decide if you need longer-term doxycycline or other treatment. Can use Tylenol or ibuprofen if needed for pain. Follow-up with Dr. Lyn Hollingshead for recheck within 1 week, or return here sooner if any problems.   Precautions discussed. Red flags discussed.- Questions invited and answered. Patient voiced understanding and agreement.  ED Prescriptions    Medication Sig Dispense Auth. Provider   doxycycline (VIBRAMYCIN) 100 MG capsule Take 1 capsule (100 mg total) by mouth 2 (two) times daily. 20 capsule Lajean Manes, MD        Lajean Manes, MD 05/27/18 [phone]

## 2018-05-27 NOTE — ED Triage Notes (Signed)
Pt c/o two "boils", located on R groin and L thigh that have been giving him problems since July. Saw a dermatologist in Aug, was rx'd cephalexin and clindamycin with no relief. Has tried to drain himself but it keeps returning.

## 2018-05-27 NOTE — Discharge Instructions (Addendum)
You have folliculitis, with a small abscess/infected cyst left thigh. Today, we performed incision and drainage of the infected cyst left thigh.  Wound care: Every day, remove dressing, can clean inside wound with peroxide on a Q-tip, then clean the skin gently with antibacterial soap, gently rinse and pat dry and redress. Even when this heals an infection is gone, its possible that you still have a cyst that might need deeper excision in the future. Antibiotic: Doxycycline twice a day by mouth for 10 days.  One refill written, and its okay to take 1 doxycycline daily for low-grade folliculitis.-However you need to follow-up with PCP or dermatologist to decide if you need longer-term doxycycline or other treatment. Can use Tylenol or ibuprofen if needed for pain. Follow-up with Dr. Lyn Hollingshead for recheck within 1 week, or return here sooner if any problems.

## 2018-05-30 ENCOUNTER — Telehealth: Payer: Self-pay

## 2018-05-30 LAB — WOUND CULTURE
MICRO NUMBER:: 91231312
RESULT:: NO GROWTH
SPECIMEN QUALITY:: ADEQUATE

## 2018-05-30 NOTE — Telephone Encounter (Signed)
Left voice message inquiring about patients status. Encouraged patient to call with questions or concerns. NML wound cx results given.

## 2018-07-03 ENCOUNTER — Other Ambulatory Visit: Payer: Self-pay | Admitting: Osteopathic Medicine

## 2018-07-03 DIAGNOSIS — K529 Noninfective gastroenteritis and colitis, unspecified: Secondary | ICD-10-CM

## 2018-07-04 NOTE — Telephone Encounter (Signed)
Requested medication (s) are due for refill today: Yes  Requested medication (s) are on the active medication list: Yes  Last refill:  02/14/17  Future visit scheduled: No  Notes to clinic:  See request - pt. Is due OV.    Requested Prescriptions  Pending Prescriptions Disp Refills   WELCHOL 625 MG tablet [[facility]] 540 tablet 3    Sig: TAKE 3 TABLETS BY MOUTH TWO TIMES DAILY WITH A MEAL     Cardiovascular:  Antilipid - Bile Acid Sequestrants Failed - 07/03/2018  9:27 PM      Failed - Total Cholesterol in normal range and within 360 days    Cholesterol  Date Value Ref Range Status  12/13/2016 135 <200 mg/dL Final         Failed - LDL in normal range and within 360 days    LDL Cholesterol  Date Value Ref Range Status  12/13/2016 77 <100 mg/dL Final         Failed - HDL in normal range and within 360 days    HDL  Date Value Ref Range Status  12/13/2016 40 (L) >40 mg/dL Final         Failed - Triglycerides in normal range and within 360 days    Triglycerides  Date Value Ref Range Status  12/13/2016 91 <150 mg/dL Final         Failed - Valid encounter within last 12 months    Recent Outpatient Visits          1 year ago Elevated blood pressure reading   [facility] [place] Sunnie NielsenAlexander, Natalie, DO   1 year ago Elevated blood pressure reading   [place] [facility] [place] Sunnie NielsenAlexander, Natalie, DO   2 years ago Asthma with exacerbation, mild intermittent   [facility] [facility] [place] Laren BoomHommel, Sean, DO   2 years ago Chronic diarrhea   [place] [facility] [place] Laren BoomHommel, Sean, DO   4 years ago Diarrhea    [facility] [place] Laren BoomHommel, Sean, DO

## 2018-07-04 NOTE — Telephone Encounter (Signed)
Pt has an appt with provider on 07/05/18. Refill for med will be reviewed.

## 2018-07-05 ENCOUNTER — Ambulatory Visit: Payer: 59 | Admitting: Osteopathic Medicine

## 2018-07-05 ENCOUNTER — Encounter: Payer: Self-pay | Admitting: Osteopathic Medicine

## 2018-07-05 VITALS — BP 136/85 | HR 78 | Temp 97.6°F | Wt 186.0 lb

## 2018-07-05 DIAGNOSIS — K529 Noninfective gastroenteritis and colitis, unspecified: Secondary | ICD-10-CM | POA: Diagnosis not present

## 2018-07-05 DIAGNOSIS — Z23 Encounter for immunization: Secondary | ICD-10-CM | POA: Diagnosis not present

## 2018-07-05 DIAGNOSIS — R030 Elevated blood-pressure reading, without diagnosis of hypertension: Secondary | ICD-10-CM | POA: Diagnosis not present

## 2018-07-05 MED ORDER — COLESEVELAM HCL 625 MG PO TABS: 1875.0000 mg | ORAL_TABLET | Freq: Three times a day (TID) | ORAL | 0 refills | Status: DC

## 2018-07-05 MED ORDER — COLESEVELAM HCL 625 MG PO TABS: ORAL_TABLET | ORAL | 3 refills | Status: DC

## 2018-07-05 NOTE — Progress Notes (Signed)
HPI: Allen ParrMichael Gorelik is a 42 y.o. male who  has a past medical history of Environmental allergies and Pneumonia.  [facility], 07/05/18,  for chief complaint of:  BP follow-up Med refills  Borderline BP, no CP/SOB Never on meds  Needs refill of Welchol which he takes for GI issues He's a bit resistant to coming in for refills of this medicine See A/P  Following w/ Urology for T replacement, no labs available for review at this time    BP Readings from Last 3 Encounters:  07/05/18 136/85  05/27/18 132/90  12/09/17 120/86        At today's visit... Past medical history, surgical history, and family history reviewed and updated as needed.  Current medication list and allergy/intolerance information reviewed and updated as needed. (See remainder of HPI, ROS, Phys Exam below)          ASSESSMENT/PLAN: The primary encounter diagnosis was Elevated blood pressure reading. Diagnoses of Need for influenza vaccination and Chronic diarrhea were also pertinent to this visit.   Advised pt I need to see him yearly for this refill but also to monitor other preventive care and screening - whole person stuff!   Recommend next visit is annual  He is following w/ Urology for testosterone mgt  Discussed borderline BP - above goal, and considering possible cardiac risks of T replacement, we should strongly consider treating BP. Pt states he will check BP outside office. Goals discussed 130/80 or less.     Orders Placed This Encounter  Procedures  . Flu Vaccine QUAD 6+ mos PF IM (Fluarix Quad PF)  . CBC  . COMPLETE METABOLIC PANEL WITH GFR  . Lipid panel     Meds ordered this encounter  Medications  . DISCONTD: colesevelam (WELCHOL) 625 MG tablet    Sig: Take 3 tablets by mouth two times daily with a meal.    Dispense:  540 tablet    Refill:  3  . colesevelam (WELCHOL) 625 MG tablet    Sig: Take 3 tablets (1,875 mg  total) by mouth 3 (three) times daily.    Dispense:  270 tablet    Refill:  0       Follow-up plan: Return in about 1 year (around 07/06/2019) for ANNUAL PHYSICAL AND ROUTINE PREVENTIVE HEALTH - sooner if needed .                             ############################################ ############################################ ############################################ ############################################    Current Meds  Medication Sig  . colesevelam (WELCHOL) 625 MG tablet Take 3 tablets by mouth two times daily with a meal.  . Omega-3 Fatty Acids (FISH OIL) 1000 MG CAPS Take by mouth.  . TESTOSTERONE IM Inject into the muscle.    No Known Allergies     Review of Systems:  Constitutional: No recent illness  Cardiac: No  chest pain, No  pressure, No palpitations  Respiratory:  No  shortness of breath. No  Cough  Gastrointestinal: No  abdominal pain  Musculoskeletal: No new myalgia/arthralgia  Skin: No  Rash  Neurologic: No  weakness, No  Dizziness  Psychiatric: No  concerns with depression, No  concerns with anxiety  Exam:  BP 136/85 (BP Location: Left Arm, Patient Position: Sitting, Cuff Size: Normal)   Pulse 78   Temp 97.6 F (36.4 C) (Oral)   Wt 186 lb (84.4 kg)   BMI 30.02  kg/m   Constitutional: VS see above. General Appearance: alert, well-developed, well-nourished, NAD  Eyes: Normal lids and conjunctive, non-icteric sclera  Ears, Nose, Mouth, Throat: MMM, Normal external inspection ears/nares/mouth/lips/gums.  Neck: No masses, trachea midline.   Respiratory: Normal respiratory effort. no wheeze, no rhonchi, no rales  Cardiovascular: S1/S2 normal, no murmur, no rub/gallop auscultated. RRR.   Musculoskeletal: Gait normal. Symmetric and independent movement of all extremities  Neurological: Normal balance/coordination. No tremor.  Skin: warm, dry, intact.   Psychiatric: Normal judgment/insight. Normal  mood and affect. Oriented x3.       Visit summary with medication list and pertinent instructions was printed for patient to review, patient was advised to alert [place] if any updates are needed. All questions at time of visit were answered - patient instructed to contact office with any additional concerns. ER/RTC precautions were reviewed with the patient and understanding verbalized.     Please note: voice recognition software was used to produce this document, and typos may escape review. Please contact Dr. Lyn Hollingshead for any needed clarifications.    Follow up plan: Return in about 1 year (around 07/06/2019) for ANNUAL PHYSICAL AND ROUTINE PREVENTIVE HEALTH - sooner if needed .

## 2018-07-06 LAB — COMPLETE METABOLIC PANEL WITH GFR
AG Ratio: 1.9 (calc) (ref 1.0–2.5)
ALBUMIN MSPROF: 4.5 g/dL (ref 3.6–5.1)
ALT: 37 U/L (ref 9–46)
AST: 21 U/L (ref 10–40)
Alkaline phosphatase (APISO): 41 U/L (ref 40–115)
BUN: 9 mg/dL (ref 7–25)
CHLORIDE: 108 mmol/L (ref 98–110)
CO2: 26 mmol/L (ref 20–32)
CREATININE: 1.05 mg/dL (ref 0.60–1.35)
Calcium: 9.4 mg/dL (ref 8.6–10.3)
GFR, EST AFRICAN AMERICAN: 102 mL/min/{1.73_m2} (ref >=60)
GFR, Est Non African American: 88 mL/min/{1.73_m2} (ref >=60)
Globulin: 2.4 g/dL (calc) (ref 1.9–3.7)
Glucose, Bld: 85 mg/dL (ref 65–99)
Potassium: 4.3 mmol/L (ref 3.5–5.3)
Sodium: 141 mmol/L (ref 135–146)
Total Bilirubin: 2 mg/dL — ABNORMAL HIGH (ref 0.2–1.2)
Total Protein: 6.9 g/dL (ref 6.1–8.1)

## 2018-07-06 LAB — CBC
HEMATOCRIT: 50.2 % — AB (ref 38.5–50.0)
Hemoglobin: 17.3 g/dL — ABNORMAL HIGH (ref 13.2–17.1)
MCH: 29.5 pg (ref 27.0–33.0)
MCHC: 34.5 g/dL (ref 32.0–36.0)
MCV: 85.5 fL (ref 80.0–100.0)
MPV: 13.2 fL — AB (ref 7.5–12.5)
Platelets: 160 10*3/uL (ref 140–400)
RBC: 5.87 10*6/uL — AB (ref 4.20–5.80)
RDW: 12.7 % (ref 11.0–15.0)
WBC: 5.3 10*3/uL (ref 3.8–10.8)

## 2018-07-06 LAB — LIPID PANEL
CHOL/HDL RATIO: 3.5 (calc) (ref <5.0)
Cholesterol: 145 mg/dL (ref <200)
HDL: 42 mg/dL (ref >40)
LDL CHOLESTEROL (CALC): 84 mg/dL
Non-HDL Cholesterol (Calc): 103 mg/dL (calc) (ref <130)
TRIGLYCERIDES: 91 mg/dL (ref <150)

## 2018-08-22 ENCOUNTER — Other Ambulatory Visit: Payer: Self-pay

## 2018-08-22 ENCOUNTER — Emergency Department
Admission: EM | Admit: 2018-08-22 | Discharge: 2018-08-22 | Disposition: A | Discharge status: Home / Self Care | Payer: 59 | Source: Home / Self Care | Attending: Family Medicine | Admitting: Family Medicine

## 2018-08-22 ENCOUNTER — Encounter: Payer: Self-pay | Admitting: *Deleted

## 2018-08-22 DIAGNOSIS — B9789 Other viral agents as the cause of diseases classified elsewhere: Secondary | ICD-10-CM

## 2018-08-22 DIAGNOSIS — J069 Acute upper respiratory infection, unspecified: Secondary | ICD-10-CM

## 2018-08-22 MED ORDER — PREDNISONE 20 MG PO TABS: ORAL_TABLET | ORAL | 0 refills | Status: DC

## 2018-08-22 MED ORDER — METHYLPREDNISOLONE ACETATE 80 MG/ML IJ SUSP
80.0000 mg | Freq: Once | INTRAMUSCULAR | Status: AC
  Administered 2018-08-22: 80 mg via INTRAMUSCULAR

## 2018-08-22 NOTE — ED Provider Notes (Signed)
[facility]    CSN: [id number] Arrival date & time: 08/22/18  1011     History   Chief Complaint Chief Complaint  Patient presents with  . Cough    HPI Allen Fisher is a 43 y.o. male.   HPI Allen Fisher is a 43 y.o. male presenting to UC with c/o 3-4 days of cough and congestion that has developed into a wheeze last night. Hx of similar reaction to prior colds. He has taken OTC cough/cold medication but feels symptoms continue to worsen. He has done well with steroid shots and oral prednisone in the past. Pt hopes to receive the same treatment to help prevent continued worsening of chest tightness and congestion. Denies fever, chills, n/v/d. No SOB.    Past Medical History:  Diagnosis Date  . Environmental allergies   . Pneumonia     Patient Active Problem List   Diagnosis Date Noted  . Status post cholecystectomy 12/13/2016  . Seasonal allergic rhinitis 12/13/2016  . Elevated blood pressure reading 12/13/2016  . Onychomycosis 12/13/2016  . Acne vulgaris 07/04/2014  . Hypogonadism male 06/05/2014  . Gilbert's syndrome 06/05/2014  . IBS (irritable bowel syndrome) 06/05/2014  . Obesity 06/05/2014  . Elevated LFTs 06/05/2014  . Asthma 10/12/2010    Past Surgical History:  Procedure Laterality Date  . BACK SURGERY    . CHOLECYSTECTOMY    . KNEE SURGERY    . SHOULDER SURGERY         Home Medications    Prior to Admission medications   Medication Sig Start Date End Date Taking? Authorizing Provider  colesevelam (WELCHOL) 625 MG tablet Take 3 tablets (1,875 mg total) by mouth 3 (three) times daily. 07/05/18 08/04/18  Sunnie NielsenAlexander, Natalie, DO  Omega-3 Fatty Acids (FISH OIL) 1000 MG CAPS Take by mouth.    [provider]  predniSONE (DELTASONE) 20 MG tablet 3 tabs po day one, then 2 po daily x 4 days 08/22/18   Lurene ShadowPhelps, Jonica Bickhart O, PA-C  TESTOSTERONE IM Inject into the muscle.    [provider]    Family History Family History  Problem  Relation Age of Onset  . Cancer Mother        uterine  . Arrhythmia Mother     Social History Social History   Tobacco Use  . Smoking status: Never Smoker  . Smokeless tobacco: Current User    Types: Snuff  Substance Use Topics  . Alcohol use: No    Alcohol/week: 0.0 standard drinks  . Drug use: No     Allergies   Patient has no known allergies.   Review of Systems Review of Systems  Constitutional: Negative for chills and fever.  HENT: Positive for congestion. Negative for ear pain and sore throat.   Respiratory: Positive for cough, chest tightness and wheezing. Negative for shortness of breath.   Gastrointestinal: Negative for abdominal pain, diarrhea, nausea and vomiting.  Musculoskeletal: Negative for back pain and myalgias.     Physical Exam Triage Vital Signs ED Triage Vitals  Enc Vitals Group     BP 08/22/18 1029 123/89     Pulse Rate 08/22/18 1029 70     Resp 08/22/18 1029 14     Temp 08/22/18 1029 97.8 F (36.6 C)     Temp Source 08/22/18 1029 Oral     SpO2 08/22/18 1029 98 %     Weight 08/22/18 1030 186 lb (84.4 kg)     Height --  Head Circumference --      Peak Flow --      Pain Score 08/22/18 1030 0     Pain Loc --      Pain Edu? --      Excl. in GC? --    No data found.  Updated Vital Signs BP 123/89 (BP Location: Right Arm)   Pulse 70   Temp 97.8 F (36.6 C) (Oral)   Resp 14   Wt 186 lb (84.4 kg)   SpO2 98%   BMI 30.02 kg/m   Visual Acuity Right Eye Distance:   Left Eye Distance:   Bilateral Distance:    Right Eye Near:   Left Eye Near:    Bilateral Near:     Physical Exam Vitals signs and nursing note reviewed.  Constitutional:      Appearance: Normal appearance. He is well-developed.  HENT:     Head: Normocephalic and atraumatic.     Right Ear: Tympanic membrane normal.     Left Ear: Tympanic membrane normal.     Nose: Nose normal.     Right Sinus: No maxillary sinus tenderness or frontal sinus tenderness.      Left Sinus: No maxillary sinus tenderness or frontal sinus tenderness.     Mouth/Throat:     Lips: Pink.     Mouth: Mucous membranes are dry.     Pharynx: Oropharynx is clear. Uvula midline.  Neck:     Musculoskeletal: Normal range of motion and neck supple.  Cardiovascular:     Rate and Rhythm: Normal rate and regular rhythm.  Pulmonary:     Effort: Pulmonary effort is normal. No respiratory distress.     Breath sounds: Normal breath sounds. No stridor. No wheezing or rhonchi.     Comments: Intermittent dry hacking cough during exam. Musculoskeletal: Normal range of motion.  Skin:    General: Skin is warm and dry.  Neurological:     Mental Status: He is alert and oriented to person, place, and time.  Psychiatric:        Behavior: Behavior normal.      UC Treatments / Results  Labs (all labs ordered are listed, but only abnormal results are displayed) Labs Reviewed - No data to display  EKG None  Radiology No results found.  Procedures Procedures (including critical care time)  Medications Ordered in UC Medications  methylPREDNISolone acetate (DEPO-MEDROL) injection 80 mg (80 mg Intramuscular Given 08/22/18 1048)    Initial Impression / Assessment and Plan / UC Course  I have reviewed the triage vital signs and the nursing notes.  Pertinent labs & imaging results that were available during my care of the patient were reviewed by me and considered in my medical decision making (see chart for details).     Depo-medrol 80mg  IM given in UC Tx: prednisone F/u with PCP as needed.  Final Clinical Impressions(s) / UC Diagnoses   Final diagnoses:  Viral URI with cough     Discharge Instructions      You were given a shot of depo-medrol (a steroid) today to help with inflammation in your lungs to help you breath better and to help with your cough.  You have been prescribed 5 days of prednisone, an oral steroid.  You may start this medication tomorrow with  breakfast.    Please follow up with family medicine in 1 week if not improving.    ED Prescriptions    Medication Sig Dispense Auth. Provider   predniSONE (  DELTASONE) 20 MG tablet 3 tabs po day one, then 2 po daily x 4 days 11 tablet Lurene Shadow, PA-C     Controlled Substance Prescriptions [place] Controlled Substance Registry consulted? Not Applicable   Lurene Shadow, PA-C 08/22/18 1049

## 2018-08-22 NOTE — Discharge Instructions (Signed)
°  You were given a shot of depo-medrol (a steroid) today to help with inflammation in your lungs to help you breath better and to help with your cough.  You have been prescribed 5 days of prednisone, an oral steroid.  You may start this medication tomorrow with breakfast.    Please follow up with family medicine in 1 week if not improving.

## 2018-08-22 NOTE — ED Triage Notes (Signed)
Pt c/o cough x 3 days with wheezing last night. Denies fever. Taking OTC generic cold/cough med.

## 2018-12-10 ENCOUNTER — Other Ambulatory Visit: Payer: Self-pay

## 2018-12-10 ENCOUNTER — Emergency Department (INDEPENDENT_AMBULATORY_CARE_PROVIDER_SITE_OTHER): Payer: 59

## 2018-12-10 ENCOUNTER — Emergency Department
Admission: EM | Admit: 2018-12-10 | Discharge: 2018-12-10 | Disposition: A | Discharge status: Home / Self Care | Payer: 59 | Source: Home / Self Care | Attending: Emergency Medicine | Admitting: Emergency Medicine

## 2018-12-10 DIAGNOSIS — S20211A Contusion of right front wall of thorax, initial encounter: Secondary | ICD-10-CM | POA: Diagnosis not present

## 2018-12-10 DIAGNOSIS — R0781 Pleurodynia: Secondary | ICD-10-CM

## 2018-12-10 MED ORDER — MELOXICAM 7.5 MG PO TABS: ORAL_TABLET | ORAL | 0 refills | Status: DC

## 2018-12-10 NOTE — ED Triage Notes (Signed)
Pain in the right rib for about 2 weeks.  Denies bruising or injury

## 2018-12-10 NOTE — ED Provider Notes (Signed)
[facility]    CSN: [id number] Arrival date & time: 12/10/18  1415     History   Chief Complaint Chief Complaint  Patient presents with   Chest Pain    right rib    HPI Allen ParrMichael Trumpower is a 43 y.o. male.   HPI 2 weeks of right anterolateral rib pain.  He denies any specific injury.  He states that he plays vigorously and "horses around" with his 3 children frequently, and he feels he fractured or strained or bruised or injured right ribs. Pain is moderate, can be severe if he raises his arm or presses hard over right ribs.  Sometimes worsened when he lies on his right side in bed.  No open wound or bruising.  Tried ice and ibuprofen, no significant improvement. Pain is not associated with eating.  No radiation of pain.  No numbness or paresthesias or focal weakness. Denies fever or chills or cough or URI symptoms or nausea or vomiting or diarrhea or change of bowel habits or abdominal pain.  Denies anterior chest pain.  No shortness of breath.  Denies exertional chest pain.  No change in bowel habits.  Denies problems with voiding.  No hematuria. Is status post cholecystectomy years ago.  He denies general myalgias, fever, chills, sweats. History of asthma in the past, but he denies any breathing problems or wheezing or cough or shortness of breath. He states he is concerned about ruling out right rib fracture.   Past Medical History:  Diagnosis Date   Environmental allergies    Pneumonia     Patient Active Problem List   Diagnosis Date Noted   Status post cholecystectomy 12/13/2016   Seasonal allergic rhinitis 12/13/2016   Elevated blood pressure reading 12/13/2016   Onychomycosis 12/13/2016   Acne vulgaris 07/04/2014   Hypogonadism male 06/05/2014   Gilbert's syndrome 06/05/2014   IBS (irritable bowel syndrome) 06/05/2014   Obesity 06/05/2014   Elevated LFTs 06/05/2014   Asthma 10/12/2010    Past Surgical History:  Procedure Laterality Date    BACK SURGERY     CHOLECYSTECTOMY     KNEE SURGERY     SHOULDER SURGERY         Home Medications    Prior to Admission medications   Medication Sig Start Date End Date Taking? Authorizing Provider  colesevelam (WELCHOL) 625 MG tablet Take 3 tablets (1,875 mg total) by mouth 3 (three) times daily. 07/05/18 08/04/18  Sunnie NielsenAlexander, Natalie, DO  meloxicam (MOBIC) 7.5 MG tablet Take 1 twice a day as needed for pain. Take with food. (Do not take with any other NSAID.) 12/10/18   Lajean ManesMassey, Arlo Butt, MD  Omega-3 Fatty Acids (FISH OIL) 1000 MG CAPS Take by mouth.    [provider]  TESTOSTERONE IM Inject into the muscle.    [provider]    Family History Family History  Problem Relation Age of Onset   Cancer Mother        uterine   Arrhythmia Mother     Social History Social History   Tobacco Use   Smoking status: Never Smoker   Smokeless tobacco: Current User    Types: Snuff  Substance Use Topics   Alcohol use: No    Alcohol/week: 0.0 standard drinks   Drug use: No     Allergies   Patient has no known allergies.   Review of Systems Review of Systems  All other systems reviewed and are negative.  Pertinent items noted in HPI and  remainder of comprehensive ROS otherwise negative.   Physical Exam Triage Vital Signs ED Triage Vitals  Enc Vitals Group     BP      Pulse      Resp      Temp      Temp src      SpO2      Weight      Height      Head Circumference      Peak Flow      Pain Score      Pain Loc      Pain Edu?      Excl. in GC?    No data found.  Updated Vital Signs BP (!) 154/88 (BP Location: Right Arm)    Pulse 89    Temp 97.7 F (36.5 C) (Oral)    Resp 20    Ht 5\' 6"  (1.676 m)    Wt 89.8 kg    SpO2 97%    BMI 31.96 kg/m    Physical Exam Vitals signs reviewed.  Constitutional:      General: He is not in acute distress.    Appearance: He is well-developed.  HENT:     Head: Normocephalic and atraumatic.  Eyes:       General: No scleral icterus.    Pupils: Pupils are equal, round, and reactive to light.  Neck:     Musculoskeletal: Normal range of motion and neck supple.  Cardiovascular:     Rate and Rhythm: Normal rate and regular rhythm.  Pulmonary:     Effort: Pulmonary effort is normal.     Breath sounds: Normal breath sounds.  Chest:     Chest wall: No lacerations.       Comments: See description as depicted.  Otherwise chest is nontender without deformity.  Lungs clear, equal expansion Abdominal:     General: There is no distension.     Tenderness: There is no right CVA tenderness or left CVA tenderness. Negative signs include Murphy's sign and McBurney's sign.     Hernia: There is no hernia in the ventral area.     Comments: Soft, nontender, no organomegaly or masses.  Musculoskeletal:     Right shoulder: Normal. He exhibits normal range of motion, no tenderness and no bony tenderness.  Skin:    General: Skin is warm and dry.     Capillary Refill: Capillary refill takes less than 2 seconds.     Findings: No rash.  Neurological:     Mental Status: He is alert and oriented to person, place, and time.     Cranial Nerves: No cranial nerve deficit.     Sensory: No sensory deficit.     Motor: No weakness.     Gait: Gait is intact.  Psychiatric:        Behavior: Behavior normal.    We discussed work-up options.  He requested and agrees with ordering x-ray right ribs to rule out rib fracture.  UC Treatments / Results  Labs (all labs ordered are listed, but only abnormal results are displayed) Labs Reviewed - No data to display  EKG None  Radiology Dg Ribs Unilateral W/chest Right  Result Date: 12/10/2018 CLINICAL DATA:  43 year old male with a history of anterior rib pain EXAM: RIGHT RIBS AND CHEST - 3+ VIEW COMPARISON:  01/31/2015, 10/12/2010 FINDINGS: Cardiomediastinal silhouette unchanged in size and contour. No evidence of central vascular congestion. No pneumothorax or  pleural effusion. No confluent airspace disease. No acute displaced  fracture. IMPRESSION: Negative for acute cardiopulmonary disease. No acute displaced fracture Electronically Signed   By: Gilmer Mor D.O.   On: 12/10/2018 15:23    Procedures Procedures (including critical care time)  Medications Ordered in UC Medications - No data to display  Initial Impression / Assessment and Plan / UC Course  I have reviewed the triage vital signs and the nursing notes.  Pertinent labs & imaging results that were available during my care of the patient were reviewed by me and considered in my medical decision making (see chart for details).      Final Clinical Impressions(s) / UC Diagnoses  X-ray right ribs and PA chest x-ray normal. Reviewed normal x-rays with patient. Explained that he likely had contusion or strain of right ribs which is just slow to resolve.  No evidence of fracture or any cardiorespiratory or abdominal or GI cause.  Treatment options discussed, risk benefits alternatives discussed. He declined AVS or any printed instructions.  He preferred verbal instructions. DC ibuprofen as that has not helped significantly. Prescribed meloxicam 7.5 mg twice daily. Heat. Other symptomatic care. Anticipatory guidance, would expect this to resolve within the next 2 weeks but if not, follow-up with PCP. Red flags discussed. He voiced understanding and agreement   Final diagnoses:  Contusion of ribs, right, initial encounter   Discharge Instructions   None    ED Prescriptions    Medication Sig Dispense Auth. Provider   meloxicam (MOBIC) 7.5 MG tablet Take 1 twice a day as needed for pain. Take with food. (Do not take with any other NSAID.) 20 tablet Lajean Manes, MD     Controlled Substance Prescriptions [place] Controlled Substance Registry consulted? Not Applicable   Lajean Manes, MD 12/10/18 1536

## 2019-06-03 ENCOUNTER — Other Ambulatory Visit: Payer: Self-pay | Admitting: Osteopathic Medicine

## 2019-06-03 DIAGNOSIS — K529 Noninfective gastroenteritis and colitis, unspecified: Secondary | ICD-10-CM

## 2019-06-04 NOTE — Telephone Encounter (Signed)
Requested medication (s) are due for refill today: yes  Requested medication (s) are on the active medication list: yes  Last refill:  04/09/2019  Future visit scheduled: no  Notes to clinic: requesting 1 year supply   Requested Prescriptions  Pending Prescriptions Disp Refills   [place] MG tablet [Pharmacy Med Name: [facility]  625MG   TAB] 540 tablet 3    Sig: TAKE 3 TABLETS BY MOUTH TWO TIMES DAILY WITH A MEAL.     Cardiovascular:  Antilipid - Bile Acid Sequestrants Failed - 06/03/2019  9:29 PM      Failed - Valid encounter within last 12 months    Recent Outpatient Visits          11 months ago Elevated blood pressure reading   [place], [place], DO   2 years ago Elevated blood pressure reading   [place], Lanelle Bal, DO   2 years ago Elevated blood pressure reading   [place], Lanelle Bal, DO   3 years ago Asthma with exacerbation, mild intermittent   [facility] [place], DO   3 years ago Chronic diarrhea   [facility] [place], DO             Passed - Total Cholesterol in normal range and within 360 days    Cholesterol  Date Value Ref Range Status  07/05/2018 145 <200 mg/dL Final         Passed - LDL in normal range and within 360 days    LDL Cholesterol (Calc)  Date Value Ref Range Status  07/05/2018 84 mg/dL (calc) Final    Comment:    Reference range: <100 . Desirable range <100 mg/dL for primary prevention;   <70 mg/dL for patients with CHD or diabetic patients  with > or = 2 CHD risk factors. Marland Kitchen LDL-C is now calculated using the Martin-Hopkins  calculation, which is a validated novel method providing  better accuracy than the Friedewald equation in the  estimation of LDL-C.  Cresenciano Genre et al. Annamaria Helling. 5625;638(93): 2061-2068   (http://education.[employer].com/faq/FAQ164)          Passed - HDL in normal range and within 360 days    HDL  Date Value Ref Range Status  07/05/2018 42 >40 mg/dL Final         Passed - Triglycerides in normal range and within 360 days    Triglycerides  Date Value Ref Range Status  07/05/2018 91 <150 mg/dL Final

## 2019-07-08 ENCOUNTER — Encounter: Payer: Self-pay | Admitting: Emergency Medicine

## 2019-07-08 ENCOUNTER — Emergency Department (INDEPENDENT_AMBULATORY_CARE_PROVIDER_SITE_OTHER): Payer: 59

## 2019-07-08 ENCOUNTER — Emergency Department
Admission: EM | Admit: 2019-07-08 | Discharge: 2019-07-08 | Disposition: A | Discharge status: Home / Self Care | Payer: 59 | Source: Home / Self Care

## 2019-07-08 ENCOUNTER — Telehealth: Payer: Self-pay | Admitting: Emergency Medicine

## 2019-07-08 ENCOUNTER — Other Ambulatory Visit: Payer: Self-pay

## 2019-07-08 DIAGNOSIS — N200 Calculus of kidney: Secondary | ICD-10-CM | POA: Diagnosis not present

## 2019-07-08 DIAGNOSIS — R809 Proteinuria, unspecified: Secondary | ICD-10-CM

## 2019-07-08 DIAGNOSIS — R109 Unspecified abdominal pain: Secondary | ICD-10-CM

## 2019-07-08 DIAGNOSIS — R110 Nausea: Secondary | ICD-10-CM | POA: Diagnosis not present

## 2019-07-08 LAB — POCT CBC W AUTO DIFF (K'VILLE URGENT CARE)

## 2019-07-08 LAB — POCT URINALYSIS DIP (MANUAL ENTRY)
Blood, UA: NEGATIVE
Glucose, UA: NEGATIVE mg/dL
Leukocytes, UA: NEGATIVE
Nitrite, UA: NEGATIVE
Protein Ur, POC: NEGATIVE mg/dL
Spec Grav, UA: >=1.03 — AB (ref 1.010–1.025)
Urobilinogen, UA: 0.2 E.U./dL
pH, UA: 6 (ref 5.0–8.0)

## 2019-07-08 LAB — COMPLETE METABOLIC PANEL WITH GFR
AG Ratio: 1.9 (calc) (ref 1.0–2.5)
ALT: 30 U/L (ref 9–46)
AST: 19 U/L (ref 10–40)
Albumin: 5 g/dL (ref 3.6–5.1)
Alkaline phosphatase (APISO): 44 U/L (ref 36–130)
BUN: 16 mg/dL (ref 7–25)
CO2: 24 mmol/L (ref 20–32)
Calcium: 9.9 mg/dL (ref 8.6–10.3)
Chloride: 102 mmol/L (ref 98–110)
Creat: 1.01 mg/dL (ref 0.60–1.35)
GFR, Est African American: 107 mL/min/{1.73_m2} (ref >=60)
GFR, Est Non African American: 92 mL/min/{1.73_m2} (ref >=60)
Globulin: 2.6 g/dL (calc) (ref 1.9–3.7)
Glucose, Bld: 71 mg/dL (ref 65–99)
Potassium: 4.8 mmol/L (ref 3.5–5.3)
Sodium: 139 mmol/L (ref 135–146)
Total Bilirubin: 1.4 mg/dL — ABNORMAL HIGH (ref 0.2–1.2)
Total Protein: 7.6 g/dL (ref 6.1–8.1)

## 2019-07-08 LAB — CK: Total CK: 54 U/L (ref 44–196)

## 2019-07-08 MED ORDER — TAMSULOSIN HCL 0.4 MG PO CAPS: 0.4000 mg | ORAL_CAPSULE | Freq: Every day | ORAL | 0 refills | Status: DC

## 2019-07-08 MED ORDER — NAPROXEN 500 MG PO TABS: 500.0000 mg | ORAL_TABLET | Freq: Two times a day (BID) | ORAL | 0 refills | Status: DC

## 2019-07-08 MED ORDER — CYCLOBENZAPRINE HCL 5 MG PO TABS: 5.0000 mg | ORAL_TABLET | Freq: Two times a day (BID) | ORAL | 0 refills | Status: DC | PRN

## 2019-07-08 MED ORDER — HYDROCODONE-ACETAMINOPHEN 5-325 MG PO TABS: 1.0000 | ORAL_TABLET | Freq: Four times a day (QID) | ORAL | 0 refills | Status: DC | PRN

## 2019-07-08 NOTE — ED Triage Notes (Signed)
Low left back pain, sharp intermittent pains, cramping x 1.5 days

## 2019-07-08 NOTE — ED Notes (Signed)
Confirmation # [id number] 11:15am

## 2019-07-08 NOTE — ED Provider Notes (Signed)
[facility]    CSN: [id number] Arrival date & time: 07/08/19  0815      History   Chief Complaint Chief Complaint  Patient presents with  . Back Pain    HPI Jakori Burkett is a 43 y.o. male.   HPI Erol Flanagin is a 43 y.o. male presenting to UC with c/o Left flank pain that has been intermittent the last 1.5 days.  Pain is severe at times causing him to break out into a cold sweat and have nausea with dry heaves. Pt believes he has a kidney stone but no hx of stones in the past and no family hx of kidney stones. He reports starting the Keto diet about 1 month ago, he is not sure if that is the cause of his pain. Denies fever, vomiting or diarrhea. No pain or difficulty urinating. No hematuria. No known injury but pain feels like a dull muscle ache in his back when it is not severe. The severe episodes only last a few seconds to minutes at a time. No medication taken today.   Past Medical History:  Diagnosis Date  . Environmental allergies   . Pneumonia     Patient Active Problem List   Diagnosis Date Noted  . Status post cholecystectomy 12/13/2016  . Seasonal allergic rhinitis 12/13/2016  . Elevated blood pressure reading 12/13/2016  . Onychomycosis 12/13/2016  . Acne vulgaris 07/04/2014  . Hypogonadism male 06/05/2014  . Gilbert's syndrome 06/05/2014  . IBS (irritable bowel syndrome) 06/05/2014  . Obesity 06/05/2014  . Elevated LFTs 06/05/2014  . Asthma 10/12/2010    Past Surgical History:  Procedure Laterality Date  . BACK SURGERY    . CHOLECYSTECTOMY    . KNEE SURGERY    . SHOULDER SURGERY         Home Medications    Prior to Admission medications   Medication Sig Start Date End Date Taking? Authorizing Provider  cyclobenzaprine (FLEXERIL) 5 MG tablet Take 1-2 tablets (5-10 mg total) by mouth 2 (two) times daily as needed for muscle spasms. 07/08/19   Noe Gens, PA-C  HYDROcodone-acetaminophen (NORCO/VICODIN) 5-325 MG tablet Take 1-2  tablets by mouth every 6 (six) hours as needed for moderate pain or severe pain. 07/08/19   Noe Gens, PA-C  naproxen (NAPROSYN) 500 MG tablet Take 1 tablet (500 mg total) by mouth 2 (two) times daily. 07/08/19   Noe Gens, PA-C  Omega-3 Fatty Acids (FISH OIL) 1000 MG CAPS Take by mouth.    [provider]  tamsulosin (FLOMAX) 0.4 MG CAPS capsule Take 1 capsule (0.4 mg total) by mouth daily. 07/08/19   Noe Gens, PA-C  TESTOSTERONE IM Inject into the muscle.    [provider]  WELCHOL 625 MG tablet TAKE 3 TABLETS BY MOUTH TWO TIMES DAILY WITH A MEAL. 06/04/19   Emeterio Reeve, DO    Family History Family History  Problem Relation Age of Onset  . Cancer Mother        uterine  . Arrhythmia Mother     Social History Social History   Tobacco Use  . Smoking status: Never Smoker  . Smokeless tobacco: Current User    Types: Snuff  Substance Use Topics  . Alcohol use: Yes    Alcohol/week: 0.0 standard drinks  . Drug use: No     Allergies   Patient has no known allergies.   Review of Systems Review of Systems  Gastrointestinal: Positive for nausea. Negative for abdominal  pain, diarrhea and vomiting.  Genitourinary: Positive for flank pain (Left). Negative for decreased urine volume, dysuria, frequency, hematuria and urgency.  Musculoskeletal: Positive for back pain (Left mid) and myalgias.     Physical Exam Triage Vital Signs ED Triage Vitals  Enc Vitals Group     BP 07/08/19 0837 124/84     Pulse Rate 07/08/19 0837 80     Resp --      Temp 07/08/19 0837 97.8 F (36.6 C)     Temp Source 07/08/19 0837 Oral     SpO2 07/08/19 0837 98 %     Weight 07/08/19 0838 180 lb (81.6 kg)     Height 07/08/19 0838 5\' 6"  (1.676 m)     Head Circumference --      Peak Flow --      Pain Score 07/08/19 0838 9     Pain Loc --      Pain Edu? --      Excl. in GC? --    No data found.  Updated Vital Signs BP 124/84 (BP Location: Right Arm)   Pulse  80   Temp 97.8 F (36.6 C) (Oral)   Ht 5\' 6"  (1.676 m)   Wt 180 lb (81.6 kg)   SpO2 98%   BMI 29.05 kg/m      Physical Exam Vitals signs and nursing note reviewed.  Constitutional:      General: He is not in acute distress.    Appearance: Normal appearance. He is well-developed. He is not ill-appearing, toxic-appearing or diaphoretic.  HENT:     Head: Normocephalic and atraumatic.     Mouth/Throat:     Mouth: Mucous membranes are moist.  Neck:     Musculoskeletal: Normal range of motion.  Cardiovascular:     Rate and Rhythm: Normal rate and regular rhythm.  Pulmonary:     Effort: Pulmonary effort is normal. No respiratory distress.     Breath sounds: Normal breath sounds.  Abdominal:     General: There is no distension.     Palpations: Abdomen is soft.     Tenderness: There is no abdominal tenderness. There is left CVA tenderness (mild vs muscle tenderness). There is no right CVA tenderness.  Musculoskeletal: Normal range of motion.        General: Tenderness present.       Back:     Comments: No spinal tenderness. Mild tenderness to Left mid to lower flank.   Skin:    General: Skin is warm and dry.  Neurological:     Mental Status: He is alert and oriented to person, place, and time.  Psychiatric:        Behavior: Behavior normal.      UC Treatments / Results  Labs (all labs ordered are listed, but only abnormal results are displayed) Labs Reviewed  POCT URINALYSIS DIP (MANUAL ENTRY) - Abnormal; Notable for the following components:      Result Value   Bilirubin, UA moderate (*)    Ketones, POC UA large (80) (*)    Spec Grav, UA >=1.030 (*)    All other components within normal limits  COMPLETE METABOLIC PANEL WITH GFR  CK  POCT CBC W AUTO DIFF ([facility])    EKG   Radiology No results found.  Procedures Procedures (including critical care time)  Medications Ordered in UC Medications - No data to display  Initial Impression /  Assessment and Plan / UC Course  I have reviewed the  triage vital signs and the nursing notes.  Pertinent labs & imaging results that were available during my care of the patient were reviewed by me and considered in my medical decision making (see chart for details).     Pt concerned for kidney stone but no hx of stone. No hematuria but there is proteinuria present CBC: WNL Question muscle strain/spasm vs stone. U/S: pending, having difficulty with imaging uploading. Discussed with pt. Pt agreeable to be discharged with symptomatic tx Will notify pt of labs and imaging as soon as they result.  Discussed symptoms that warrant emergent care in the ED.  AVS provided  Final Clinical Impressions(s) / UC Diagnoses   Final diagnoses:  Left flank pain  Nausea without vomiting  Proteinuria, unspecified type     Discharge Instructions      Norco/Vicodin (hydrocodone-acetaminophen) is a narcotic pain medication, do not combine these medications with others containing tylenol. While taking, do not drink alcohol, drive, or perform any other activities that requires focus while taking these medications.   Flexeril (cyclobenzaprine) is a muscle relaxer and may cause drowsiness. Do not drink alcohol, drive, or operate heavy machinery while taking.  You will be called later today with the results of your lab test and ultrasound. In the meantime, try to stay well hydrated.  Please follow up with family medicine as needed. Most offices are offering virtual visits.    ED Prescriptions    Medication Sig Dispense Auth. Provider   HYDROcodone-acetaminophen (NORCO/VICODIN) 5-325 MG tablet Take 1-2 tablets by mouth every 6 (six) hours as needed for moderate pain or severe pain. 12 tablet Doroteo Glassman, Sotiria Keast O, PA-C   cyclobenzaprine (FLEXERIL) 5 MG tablet Take 1-2 tablets (5-10 mg total) by mouth 2 (two) times daily as needed for muscle spasms. 20 tablet Doroteo Glassman, Kolette Vey O, PA-C   naproxen (NAPROSYN) 500  MG tablet Take 1 tablet (500 mg total) by mouth 2 (two) times daily. 30 tablet Waylan Rocher O, PA-C   tamsulosin (FLOMAX) 0.4 MG CAPS capsule Take 1 capsule (0.4 mg total) by mouth daily. 30 capsule Lurene Shadow, [place]     I have reviewed the PDMP during this encounter.   Lurene Shadow, PA-C 07/08/19 1031

## 2019-07-08 NOTE — Telephone Encounter (Signed)
Discussed imaging and labs. Encouraged to take medication as prescribed and stay well hydrated. F/u with PCP and urology as this is his first kidney stone.

## 2019-07-08 NOTE — Discharge Instructions (Signed)
°  Norco/Vicodin (hydrocodone-acetaminophen) is a narcotic pain medication, do not combine these medications with others containing tylenol. While taking, do not drink alcohol, drive, or perform any other activities that requires focus while taking these medications.   Flexeril (cyclobenzaprine) is a muscle relaxer and may cause drowsiness. Do not drink alcohol, drive, or operate heavy machinery while taking.  You will be called later today with the results of your lab test and ultrasound. In the meantime, try to stay well hydrated.  Please follow up with family medicine as needed. Most offices are offering virtual visits.

## 2019-08-18 ENCOUNTER — Telehealth: Payer: 59

## 2019-08-19 ENCOUNTER — Encounter: Payer: Self-pay | Admitting: Physician Assistant

## 2019-08-19 ENCOUNTER — Ambulatory Visit (INDEPENDENT_AMBULATORY_CARE_PROVIDER_SITE_OTHER): Payer: 59 | Admitting: Physician Assistant

## 2019-08-19 ENCOUNTER — Other Ambulatory Visit: Payer: Self-pay

## 2019-08-19 VITALS — Temp 98.7°F | Ht 66.0 in | Wt 176.0 lb

## 2019-08-19 DIAGNOSIS — Z20822 Contact with and (suspected) exposure to covid-19: Secondary | ICD-10-CM

## 2019-08-19 DIAGNOSIS — J22 Unspecified acute lower respiratory infection: Secondary | ICD-10-CM | POA: Diagnosis not present

## 2019-08-19 DIAGNOSIS — Z20828 Contact with and (suspected) exposure to other viral communicable diseases: Secondary | ICD-10-CM

## 2019-08-19 MED ORDER — PREDNISONE 50 MG PO TABS: ORAL_TABLET | ORAL | 0 refills | Status: DC

## 2019-08-19 MED ORDER — ALBUTEROL SULFATE HFA 108 (90 BASE) MCG/ACT IN AERS: INHALATION_SPRAY | RESPIRATORY_TRACT | 0 refills | Status: DC

## 2019-08-19 NOTE — Progress Notes (Signed)
Patient called and wanted to be tested for strep testing and his work would need a test. He was needing something quicker than waiting tomorrow. He was wanting to go to urgent care to get swabbed for strep testing and covid testing. He is now coming by the office to be tested for COVID swabbing per Vinetta Bergamo who is seeing a few patients for the office. No other questions.

## 2019-08-19 NOTE — Progress Notes (Signed)
Virtual Visit via Video (App used: Doximity) Note  I connected with      Allen Fisher on 08/19/19 at 10:33 PM  by a telemedicine application and verified that I am speaking with the correct person using two identifiers.  Patient is in vehicle in [place], [place] I am in office    I discussed the limitations of evaluation and management by telemedicine and the availability of in person appointments. The patient expressed understanding and agreed to proceed.  History of Present Illness: Allen Fisher is a 44 y.o. male who would like to discuss cough/congestion  New onset URI symptoms Onset 2-3 days ago Rhinorrhea, nasal congestion and scratchy throat Woke up yesterday with chest congestion that concerned him for bronchitis. He has been having a raspy cough with mucus in his chest that he cannot expectorate and slight wheezing.  He has been taking Mucinex and feels the cough "loosening."  Denies fever, SOB/DOE, chest pain. Denies loss of taste/smell, headache, myalgias. Endorses sick contacts - kids at home with "colds." Denies known COVID-19 exposure but household contacts have not been tested. He works as a Engineer, structural  Reports history of asthma, bronchitis, and pneumonia. States asthma is only triggered by colds/infections. States every year he tries a Z-pak and it does not work. He usually gets relief from steroid injection and Prednisone and is requesting stronger antibiotic than Z-pak.    Observations/Objective: Temp 98.7 F (37.1 C) (Oral)   Ht 5\' 6"  (1.676 m)   Wt 176 lb (79.8 kg)   SpO2 97%   BMI 28.41 kg/m  BP Readings from Last 3 Encounters:  07/08/19 124/84  12/10/18 (!) 154/88  08/22/18 123/89   Exam limited by telehealth VS reviewed Gen: alert, well-appearing, no acute distress Pulm: speaking in full sentences, normal work of breathing Neuro: alert and oriented x 3 Psych: cooperative, speech is articulate, normal rate and volume; thought processes clear and  goal-directed, normal judgment, good insight  Lab and Radiology Results No results found for this or any previous visit (from the past 72 hour(s)). No results found.     Assessment and Plan: 44 y.o. male with The encounter diagnosis was Acute lower respiratory infection.  .Diagnoses and all orders for this visit:  Acute lower respiratory infection -     predniSONE (DELTASONE) 50 MG tablet; One tab PO daily for 5 days. -     albuterol (VENTOLIN HFA) 108 (90 Base) MCG/ACT inhaler; Inhale 2 puffs twice a day x 3 days or until symptoms improve. May use 1-2 puff every 4 hr prn SOB/wheezing -     Novel Coronavirus, NAA ([facility])    PUI for COVID-19 infection. NAAT collected by CMA via drive-up testing. Counseled to isolate at home while awaiting results. Isolation instructions sent via Mychart  Discussed likely viral etiology of bronchitis and that antibiotics would not be beneficial. He has had good response to steroids in the past and possible diagnosis of intermittent asthma triggered by respiratory illnesses. Start Prednisone burst, and Albuterol 2 puffs bid. Cont Mucinex and supportive care. Counseled on ED precautions.  There are no Patient Instructions on file for this visit.  Instructions sent via MyChart. If MyChart not available, pt was given option for info via personal e-mail w/ no guarantee of protected health info over unsecured e-mail communication, and MyChart sign-up instructions were sent to patient.   Follow Up Instructions: No follow-ups on file.    I discussed the assessment and treatment plan with the patient.  The patient was provided an opportunity to ask questions and all were answered. The patient agreed with the plan and demonstrated an understanding of the instructions.   The patient was advised to call back or seek an in-person evaluation if any new concerns, if symptoms worsen or if the condition fails to improve as anticipated.  11 minutes of  non-face-to-face time was provided during this encounter.      . . . . . . . . . . . . . Marland Kitchen                   Historical information moved to improve visibility of documentation.  Past Medical History:  Diagnosis Date  . Environmental allergies   . Pneumonia    Past Surgical History:  Procedure Laterality Date  . BACK SURGERY    . CHOLECYSTECTOMY    . KNEE SURGERY    . SHOULDER SURGERY     Social History   Tobacco Use  . Smoking status: Never Smoker  . Smokeless tobacco: Current User    Types: Snuff  Substance Use Topics  . Alcohol use: Yes    Alcohol/week: 0.0 standard drinks   family history includes Arrhythmia in his mother; Cancer in his mother.  Medications: Current Outpatient Medications  Medication Sig Dispense Refill  . Omega-3 Fatty Acids (FISH OIL) 1000 MG CAPS Take by mouth.    . TESTOSTERONE IM Inject into the muscle.    . WELCHOL 625 MG tablet TAKE 3 TABLETS BY MOUTH TWO TIMES DAILY WITH A MEAL. 540 tablet 0  . albuterol (VENTOLIN HFA) 108 (90 Base) MCG/ACT inhaler Inhale 2 puffs twice a day x 3 days or until symptoms improve. May use 1-2 puff every 4 hr prn SOB/wheezing 6.7 g 0  . cyclobenzaprine (FLEXERIL) 5 MG tablet Take 1-2 tablets (5-10 mg total) by mouth 2 (two) times daily as needed for muscle spasms. (Patient not taking: Reported on 08/19/2019) 20 tablet 0  . HYDROcodone-acetaminophen (NORCO/VICODIN) 5-325 MG tablet Take 1-2 tablets by mouth every 6 (six) hours as needed for moderate pain or severe pain. (Patient not taking: Reported on 08/19/2019) 12 tablet 0  . naproxen (NAPROSYN) 500 MG tablet Take 1 tablet (500 mg total) by mouth 2 (two) times daily. (Patient not taking: Reported on 08/19/2019) 30 tablet 0  . predniSONE (DELTASONE) 50 MG tablet One tab PO daily for 5 days. 5 tablet 0  . tamsulosin (FLOMAX) 0.4 MG CAPS capsule Take 1 capsule (0.4 mg total) by mouth daily. (Patient not taking: Reported on 08/19/2019) 30 capsule  0   No current facility-administered medications for this visit.   No Known Allergies

## 2019-08-21 ENCOUNTER — Encounter: Payer: Self-pay | Admitting: Physician Assistant

## 2019-08-21 LAB — NOVEL CORONAVIRUS, NAA: SARS-CoV-2, NAA: NOT DETECTED

## 2019-08-23 ENCOUNTER — Other Ambulatory Visit: Payer: Self-pay | Admitting: Osteopathic Medicine

## 2019-08-23 DIAGNOSIS — K529 Noninfective gastroenteritis and colitis, unspecified: Secondary | ICD-10-CM

## 2019-08-23 NOTE — Telephone Encounter (Signed)
Forwarding medication refill request to the clinical team for review.

## 2019-08-26 NOTE — Telephone Encounter (Signed)
Needs appointment

## 2019-09-30 ENCOUNTER — Ambulatory Visit: Payer: 59 | Attending: Internal Medicine

## 2019-09-30 DIAGNOSIS — Z23 Encounter for immunization: Secondary | ICD-10-CM | POA: Insufficient documentation

## 2019-09-30 NOTE — Progress Notes (Signed)
   Covid-19 Vaccination Clinic  Name:  Allen Fisher    MRN: [id number] DOB: Apr 17, 1976  09/30/2019  [name] was observed post Covid-19 immunization for 15 minutes without incidence. He was provided with Vaccine Information Sheet and instruction to access the V-Safe system.   [name] was instructed to call 911 with any severe reactions post vaccine: Marland Kitchen Difficulty breathing  . Swelling of your face and throat  . A fast heartbeat  . A bad rash all over your body  . Dizziness and weakness    Immunizations Administered    Name Date Dose VIS Date Route   Pfizer COVID-19 Vaccine 09/30/2019  6:00 PM 0.3 mL 07/26/2019 Intramuscular   Manufacturer: [employer], [employer]   Lot: [id number]   NDC: [address]-9

## 2019-10-22 ENCOUNTER — Ambulatory Visit: Payer: 59 | Attending: Internal Medicine

## 2019-10-22 DIAGNOSIS — Z23 Encounter for immunization: Secondary | ICD-10-CM | POA: Insufficient documentation

## 2019-10-22 NOTE — Progress Notes (Signed)
   Covid-19 Vaccination Clinic  Name:  Allen Fisher    MRN: [id number] DOB: [birth year]  10/22/2019  [name] was observed post Covid-19 immunization for 15 minutes without incident. He was provided with Vaccine Information Sheet and instruction to access the V-Safe system.   [name] was instructed to call 911 with any severe reactions post vaccine: Marland Kitchen Difficulty breathing  . Swelling of face and throat  . A fast heartbeat  . A bad rash all over body  . Dizziness and weakness   Immunizations Administered    Name Date Dose VIS Date Route   Pfizer COVID-19 Vaccine 10/22/2019  3:20 PM 0.3 mL 07/26/2019 Intramuscular   Manufacturer: [employer], [employer]   Lot: [id number]   NDC: [address]-1

## 2019-10-23 ENCOUNTER — Ambulatory Visit: Payer: Self-pay

## 2019-10-24 ENCOUNTER — Telehealth (INDEPENDENT_AMBULATORY_CARE_PROVIDER_SITE_OTHER): Payer: 59 | Admitting: Family Medicine

## 2019-10-24 ENCOUNTER — Encounter: Payer: Self-pay | Admitting: Family Medicine

## 2019-10-24 DIAGNOSIS — J302 Other seasonal allergic rhinitis: Secondary | ICD-10-CM | POA: Diagnosis not present

## 2019-10-24 DIAGNOSIS — E291 Testicular hypofunction: Secondary | ICD-10-CM | POA: Diagnosis not present

## 2019-10-24 MED ORDER — TESTOSTERONE CYPIONATE 200 MG/ML IM SOLN: 200.0000 mg | INTRAMUSCULAR | 0 refills | Status: DC

## 2019-10-24 MED ORDER — PREDNISONE 20 MG PO TABS: ORAL_TABLET | ORAL | 0 refills | Status: DC

## 2019-10-24 NOTE — Progress Notes (Signed)
Maksim Peregoy - 44 y.o. male MRN [id number]  Date of birth: [birth year]   This visit type was conducted due to national recommendations for restrictions regarding the COVID-19 Pandemic (e.g. social distancing).  This format is felt to be most appropriate for this patient at this time.  All issues noted in this document were discussed and addressed.  No physical exam was performed (except for noted visual exam findings with Video Visits).  I discussed the limitations of evaluation and management by telemedicine and the availability of in person appointments. The patient expressed understanding and agreed to proceed.  I connected with@ on 10/24/19 at 10:50 AM EST by a video enabled telemedicine application and verified that I am speaking with the correct person using two identifiers.  Present at visit: Everrett Coombe, DO [name]   Patient Location: Home [address] [place] [address]   Provider location:   Home office  Chief Complaint  Patient presents with  . Allergies    HPI  Kaidence Callaway is a 44 y.o. male who presents via audio/video conferencing for a telehealth visit today.  He reports symptoms of congestion with post nasal drainage and mild cough as well as some eye irrtation.  Reports history of seasonal allergies and symptoms are similar to previous allergy flares.  Reports that if he doesn't do something early on this often progresses to bronchitis or pneumonia.  He is taking antihistamine and flonase.  States he usually gets a steroid shot and oral prednisone for allergies. He denies fever, chills,  shortness of breath, chest tightness, nausea, vomiting, diarrhea, headache or body aches.   He also reports that he would like his testosterone refilled.  He has been seeing urology however urologist recommended that PCP take over since labs and dosing have been stable.  He was last seen by Dr[facility] Mena Goes @ [facility] a few months ago.   ROS:  A comprehensive ROS was completed  and negative except as noted per HPI  Past Medical History:  Diagnosis Date  . Environmental allergies   . Pneumonia     Past Surgical History:  Procedure Laterality Date  . BACK SURGERY    . CHOLECYSTECTOMY    . KNEE SURGERY    . SHOULDER SURGERY      Family History  Problem Relation Age of Onset  . Cancer Mother        uterine  . Arrhythmia Mother     Social History   Socioeconomic History  . Marital status: Married    Spouse name: Not on file  . Number of children: Not on file  . Years of education: Not on file  . Highest education level: Not on file  Occupational History  . Not on file  Tobacco Use  . Smoking status: Never Smoker  . Smokeless tobacco: Current User    Types: Snuff  Substance and Sexual Activity  . Alcohol use: Yes    Alcohol/week: 0.0 standard drinks  . Drug use: No  . Sexual activity: Yes    Partners: Female  Other Topics Concern  . Not on file  Social History Narrative  . Not on file   Social Determinants of Health   Financial Resource Strain:   . Difficulty of Paying Living Expenses:   Food Insecurity:   . Worried About Programme researcher, broadcasting/film/video in the Last Year:   . Barista in the Last Year:   Transportation Needs:   . Freight forwarder (Medical):   Marland Kitchen  Lack of Transportation (Non-Medical):   Physical Activity:   . Days of Exercise per Week:   . Minutes of Exercise per Session:   Stress:   . Feeling of Stress :   Social Connections:   . Frequency of Communication with Friends and Family:   . Frequency of Social Gatherings with Friends and Family:   . Attends Religious Services:   . Active Member of Clubs or Organizations:   . Attends Archivist Meetings:   Marland Kitchen Marital Status:   Intimate Partner Violence:   . Fear of Current or Ex-Partner:   . Emotionally Abused:   Marland Kitchen Physically Abused:   . Sexually Abused:      Current Outpatient Medications:  .  albuterol (VENTOLIN HFA) 108 (90 Base) MCG/ACT inhaler,  Inhale 2 puffs twice a day x 3 days or until symptoms improve. May use 1-2 puff every 4 hr prn SOB/wheezing, Disp: 6.7 g, Rfl: 0 .  testosterone cypionate (DEPOTESTOSTERONE CYPIONATE) 200 MG/ML injection, Inject 1 mL (200 mg total) into the muscle every 14 (fourteen) days., Disp: 10 mL, Rfl: 0 .  WELCHOL 625 MG tablet, TAKE 3 TABLETS BY MOUTH  TWICE DAILY WITH A MEAL, Disp: 540 tablet, Rfl: 0 .  predniSONE (DELTASONE) 20 MG tablet, Take 60mg  PO on day one followed by 40mg  daily on days 2-6, Disp: 13 tablet, Rfl: 0 .  tamsulosin (FLOMAX) 0.4 MG CAPS capsule, Take 1 capsule (0.4 mg total) by mouth daily. (Patient not taking: Reported on 08/19/2019), Disp: 30 capsule, Rfl: 0  EXAM:  VITALS per patient if applicable: Temp 96.2 F (37 C)   Ht 5\' 6"  (1.676 m)   Wt 175 lb (79.4 kg)   BMI 28.25 kg/m   GENERAL: alert, oriented, appears well and in no acute distress  HEENT: atraumatic, conjunttiva clear, no obvious abnormalities on inspection of external nose and ears  NECK: normal movements of the head and neck  LUNGS: on inspection no signs of respiratory distress, breathing rate appears normal, no obvious gross SOB, gasping or wheezing  CV: no obvious cyanosis  MS: moves all visible extremities without noticeable abnormality  PSYCH/NEURO: pleasant and cooperative, no obvious depression or anxiety, speech and thought processing grossly intact  ASSESSMENT AND PLAN:  Discussed the following assessment and plan:  Seasonal allergic rhinitis Symptoms consistent with allergies at this time.  Will provide steroid burst.  Unable to provide injection as this is virtual visit.  Will start 60mg  on day one followed by 40mg  on days 2-6. Discussed to contact office for new or worsening symptoms.  He expresses understanding.   Hypogonadism male He has been followed by urology for this.  Would like [place] to take over rx per urologist recommendations.  Request that he have outside urology records sent for  latest testosterone levels.  Will send in 90 day supple to mail order but will need to schedule f/u with Dr. Sheppard Coil to address additional refills and ongoing monitoring.   30 minutes spent including pre visit preparation, review of prior notes and labs, encounter with patient via video visit and same day documentation.    I discussed the assessment and treatment plan with the patient. The patient was provided an opportunity to ask questions and all were answered. The patient agreed with the plan and demonstrated an understanding of the instructions.   The patient was advised to call back or seek an in-person evaluation if the symptoms worsen or if the condition fails to improve as anticipated.  Luetta Nutting, DO

## 2019-10-24 NOTE — Assessment & Plan Note (Signed)
He has been followed by urology for this.  Would like [place] to take over rx per urologist recommendations.  Request that he have outside urology records sent for latest testosterone levels.  Will send in 90 day supple to mail order but will need to schedule f/u with Dr. Lyn Hollingshead to address additional refills and ongoing monitoring.

## 2019-10-24 NOTE — Assessment & Plan Note (Signed)
Symptoms consistent with allergies at this time.  Will provide steroid burst.  Unable to provide injection as this is virtual visit.  Will start 60mg  on day one followed by 40mg  on days 2-6. Discussed to contact office for new or worsening symptoms.  He expresses understanding.

## 2019-10-24 NOTE — Progress Notes (Signed)
Patient called and reports that he has bad seasonal allergies,  Sinus pressure, cough,  Patient reports that if this gets too far then it goes to Bronchitis.   He has been dealing with this for the past 15 years.   Patient needs a refill of his Testosterone to Mail order.

## 2020-03-21 ENCOUNTER — Other Ambulatory Visit: Payer: Self-pay

## 2020-03-21 ENCOUNTER — Emergency Department
Admission: EM | Admit: 2020-03-21 | Discharge: 2020-03-21 | Disposition: A | Discharge status: Home / Self Care | Payer: 59 | Source: Home / Self Care

## 2020-03-21 ENCOUNTER — Emergency Department: Admit: 2020-03-21 | Discharge status: Absent without leave | Payer: Self-pay

## 2020-03-21 ENCOUNTER — Encounter: Payer: Self-pay | Admitting: Emergency Medicine

## 2020-03-21 DIAGNOSIS — J069 Acute upper respiratory infection, unspecified: Secondary | ICD-10-CM

## 2020-03-21 MED ORDER — PREDNISONE 50 MG PO TABS: 50.0000 mg | ORAL_TABLET | Freq: Every day | ORAL | 0 refills | Status: AC

## 2020-03-21 MED ORDER — METHYLPREDNISOLONE SODIUM SUCC 40 MG IJ SOLR
80.0000 mg | Freq: Once | INTRAMUSCULAR | Status: AC
  Administered 2020-03-21: 80 mg via INTRAMUSCULAR

## 2020-03-21 NOTE — ED Provider Notes (Signed)
[facility]    CSN: [id number] Arrival date & time: 03/21/20  1347      History   Chief Complaint Chief Complaint  Patient presents with  . URI    HPI Allen Fisher is a 44 y.o. male.   HPI Allen Fisher is a 44 y.o. male presenting to UC with c/o dry cough for 2 days, mild nasal congestion, diarrhea that started today he believes from post-nasal drip and hx of IBS.  He has tried drinking extra fluids to help thin his mucous but cannot cough up any phlegm or get any congestion out of his nose. He is requesting a steroid shot as he has done well with steroids in the past for URI symptoms, he hope to get started today before symptoms worsen, he leaves for a beach trip soon.  Denies fever, chills, n/v/d. No known sick contacts. He has received the Covid vaccine but would like to be tested to make sure he does not have it now.   Past Medical History:  Diagnosis Date  . Environmental allergies   . Pneumonia     Patient Active Problem List   Diagnosis Date Noted  . Status post cholecystectomy 12/13/2016  . Seasonal allergic rhinitis 12/13/2016  . Elevated blood pressure reading 12/13/2016  . Onychomycosis 12/13/2016  . Acne vulgaris 07/04/2014  . Hypogonadism male 06/05/2014  . Gilbert's syndrome 06/05/2014  . IBS (irritable bowel syndrome) 06/05/2014  . Obesity 06/05/2014  . Elevated LFTs 06/05/2014  . Asthma 10/12/2010    Past Surgical History:  Procedure Laterality Date  . BACK SURGERY    . CHOLECYSTECTOMY    . KNEE SURGERY    . SHOULDER SURGERY         Home Medications    Prior to Admission medications   Medication Sig Start Date End Date Taking? Authorizing Provider  WELCHOL 625 MG tablet TAKE 3 TABLETS BY MOUTH  TWICE DAILY WITH A MEAL 08/26/19  Yes Sunnie Nielsen, DO  albuterol (VENTOLIN HFA) 108 (90 Base) MCG/ACT inhaler Inhale 2 puffs twice a day x 3 days or until symptoms improve. May use 1-2 puff every 4 hr prn SOB/wheezing 08/19/19    Carlis Stable, PA-C  predniSONE (DELTASONE) 50 MG tablet Take 1 tablet (50 mg total) by mouth daily with breakfast for 5 days. 03/21/20 03/26/20  Lurene Shadow, PA-C  tamsulosin (FLOMAX) 0.4 MG CAPS capsule Take 1 capsule (0.4 mg total) by mouth daily. Patient not taking: Reported on 08/19/2019 07/08/19   Lurene Shadow, PA-C  testosterone cypionate (DEPOTESTOSTERONE CYPIONATE) 200 MG/ML injection Inject 1 mL (200 mg total) into the muscle every 14 (fourteen) days. 10/24/19 01/22/20  Everrett Coombe, DO    Family History Family History  Problem Relation Age of Onset  . Cancer Mother        uterine  . Arrhythmia Mother     Social History Social History   Tobacco Use  . Smoking status: Never Smoker  . Smokeless tobacco: Current User    Types: Snuff  Vaping Use  . Vaping Use: Never used  Substance Use Topics  . Alcohol use: Yes    Alcohol/week: 0.0 standard drinks  . Drug use: No     Allergies   Patient has no known allergies.   Review of Systems Review of Systems  Constitutional: Negative for chills and fever.  HENT: Positive for congestion, sinus pressure and sore throat (mild). Negative for ear pain, sinus pain, trouble swallowing and voice change.  Respiratory: Positive for cough. Negative for shortness of breath.   Cardiovascular: Negative for chest pain and palpitations.  Gastrointestinal: Positive for diarrhea. Negative for abdominal pain, nausea and vomiting.  Musculoskeletal: Negative for arthralgias, back pain and myalgias.  Skin: Negative for rash.  Neurological: Positive for headaches (facial pressure). Negative for dizziness and light-headedness.  All other systems reviewed and are negative.    Physical Exam Triage Vital Signs ED Triage Vitals  Enc Vitals Group     BP 03/21/20 1459 (!) 141/96     Pulse Rate 03/21/20 1459 84     Resp --      Temp 03/21/20 1459 99 F (37.2 C)     Temp Source 03/21/20 1459 Oral     SpO2 03/21/20 1459 98 %      Weight --      Height --      Head Circumference --      Peak Flow --      Pain Score 03/21/20 1500 0     Pain Loc --      Pain Edu? --      Excl. in GC? --    No data found.  Updated Vital Signs BP (!) 141/96 (BP Location: Left Arm)   Pulse 84   Temp 99 F (37.2 C) (Oral)   SpO2 98%   Visual Acuity Right Eye Distance:   Left Eye Distance:   Bilateral Distance:    Right Eye Near:   Left Eye Near:    Bilateral Near:     Physical Exam Vitals and nursing note reviewed.  Constitutional:      General: He is not in acute distress.    Appearance: Normal appearance. He is well-developed. He is not ill-appearing, toxic-appearing or diaphoretic.  HENT:     Head: Normocephalic and atraumatic.     Right Ear: Tympanic membrane and ear canal normal.     Left Ear: Tympanic membrane and ear canal normal.     Nose: Nose normal.     Right Sinus: No maxillary sinus tenderness or frontal sinus tenderness.     Left Sinus: No maxillary sinus tenderness or frontal sinus tenderness.     Mouth/Throat:     Lips: Pink.     Mouth: Mucous membranes are moist.     Pharynx: Oropharynx is clear. Uvula midline.  Cardiovascular:     Rate and Rhythm: Normal rate and regular rhythm.  Pulmonary:     Effort: Pulmonary effort is normal. No respiratory distress.     Breath sounds: Normal breath sounds. No stridor. No wheezing, rhonchi or rales.  Musculoskeletal:        General: Normal range of motion.     Cervical back: Normal range of motion.  Skin:    General: Skin is warm and dry.  Neurological:     Mental Status: He is alert and oriented to person, place, and time.  Psychiatric:        Behavior: Behavior normal.      UC Treatments / Results  Labs (all labs ordered are listed, but only abnormal results are displayed) Labs Reviewed  NOVEL CORONAVIRUS, NAA    EKG   Radiology No results found.  Procedures Procedures (including critical care time)  Medications Ordered in  UC Medications  methylPREDNISolone sodium succinate (SOLU-MEDROL) 40 mg/mL injection 80 mg (80 mg Intramuscular Given 03/21/20 1524)    Initial Impression / Assessment and Plan / UC Course  I have reviewed the triage vital signs and  the nursing notes.  Pertinent labs & imaging results that were available during my care of the patient were reviewed by me and considered in my medical decision making (see chart for details).     Solumedrol 80mg  IM given in UC Covid test: pending Encouraged symptomatic tx AVS given  Final Clinical Impressions(s) / UC Diagnoses   Final diagnoses:  Viral URI with cough     Discharge Instructions      You may take 500mg  acetaminophen every 4-6 hours or in combination with ibuprofen 400-600mg  every 6-8 hours as needed for pain, inflammation, and fever.  Be sure to well hydrated with clear liquids and get at least 8 hours of sleep at night, preferably more while sick.   Please follow up with family medicine in 1 week if needed.     ED Prescriptions    Medication Sig Dispense Auth. Provider   predniSONE (DELTASONE) 50 MG tablet Take 1 tablet (50 mg total) by mouth daily with breakfast for 5 days. 5 tablet , PA-C     PDMP not reviewed this encounter.   , Lurene Shadow 03/22/20 [id number]

## 2020-03-21 NOTE — ED Triage Notes (Signed)
Patient c/o dry cough x 2 days, drinking plenty of fluids, head congestion, diarrhea x 1 day

## 2020-03-21 NOTE — Discharge Instructions (Signed)
  You may take 500mg acetaminophen every 4-6 hours or in combination with ibuprofen 400-600mg every 6-8 hours as needed for pain, inflammation, and fever.  Be sure to well hydrated with clear liquids and get at least 8 hours of sleep at night, preferably more while sick.   Please follow up with family medicine in 1 week if needed.   

## 2020-03-22 LAB — SARS-COV-2, NAA 2 DAY TAT

## 2020-03-22 LAB — NOVEL CORONAVIRUS, NAA: SARS-CoV-2, NAA: NOT DETECTED

## 2020-04-27 IMAGING — US US RENAL
1 series · 14 of 25 positions shown · non-contrast
Comparison: None.

CLINICAL DATA: Three-day history of left flank pain

EXAM:
RENAL / URINARY TRACT ULTRASOUND COMPLETE

[Series 1: us renal · 0.25mm/px · 14 of 37 slices shown]
[im 1/37]
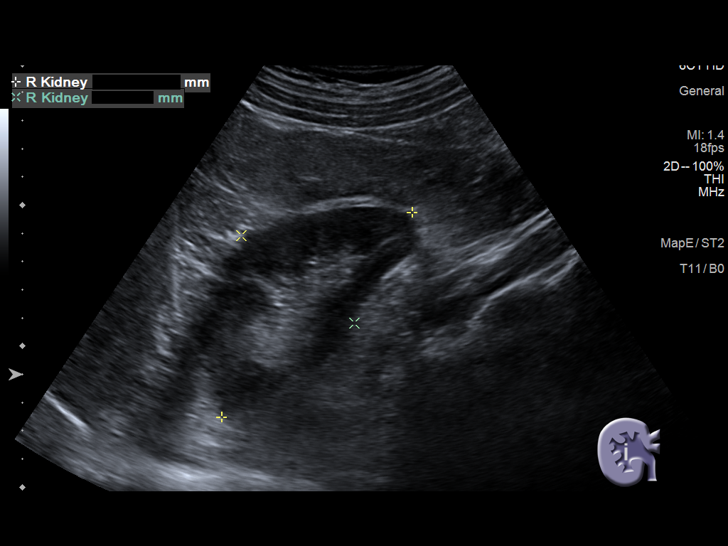
[im 4/37]
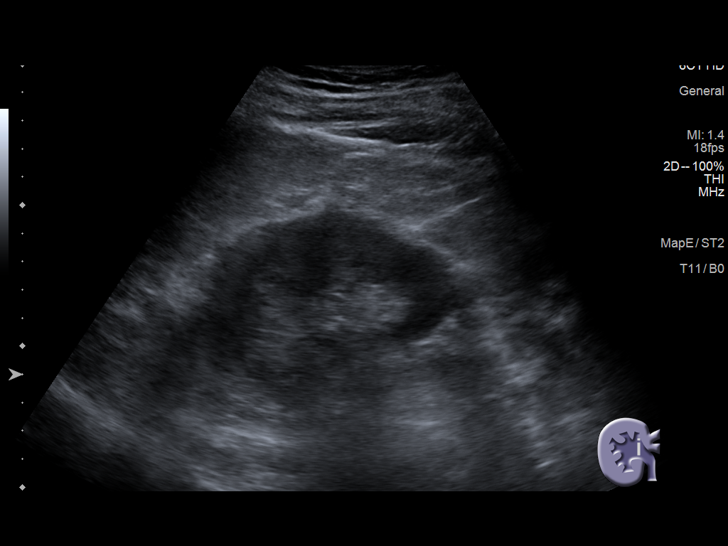
[im 7/37]
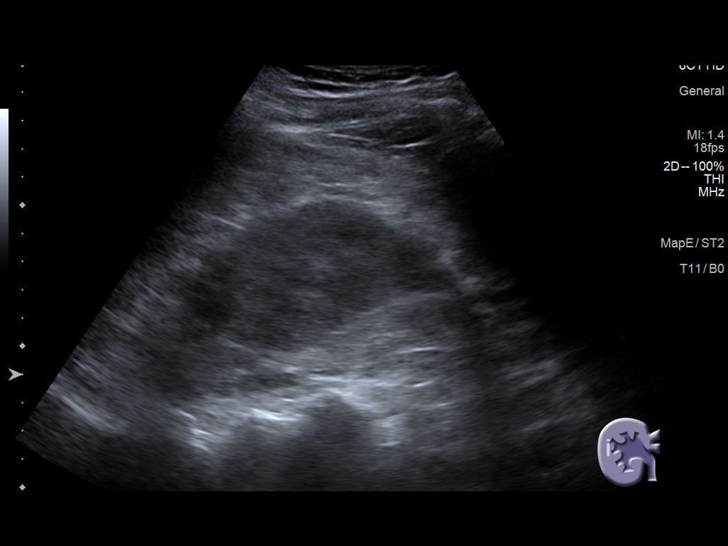
[im 10/37]
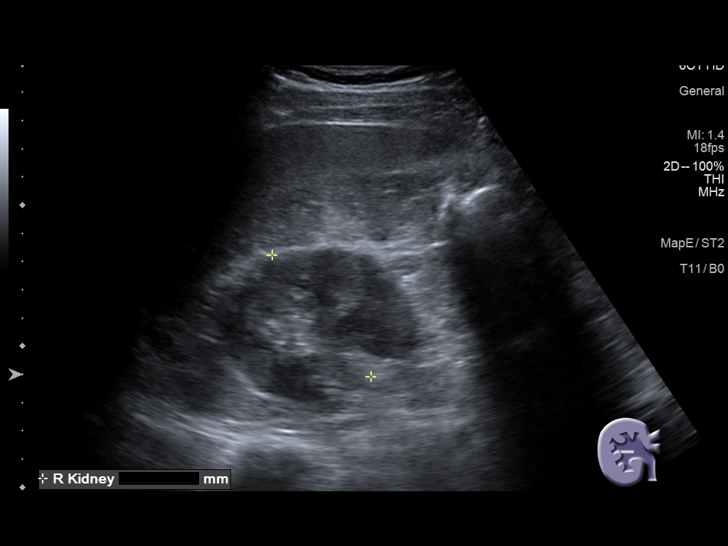
[im 13/37]
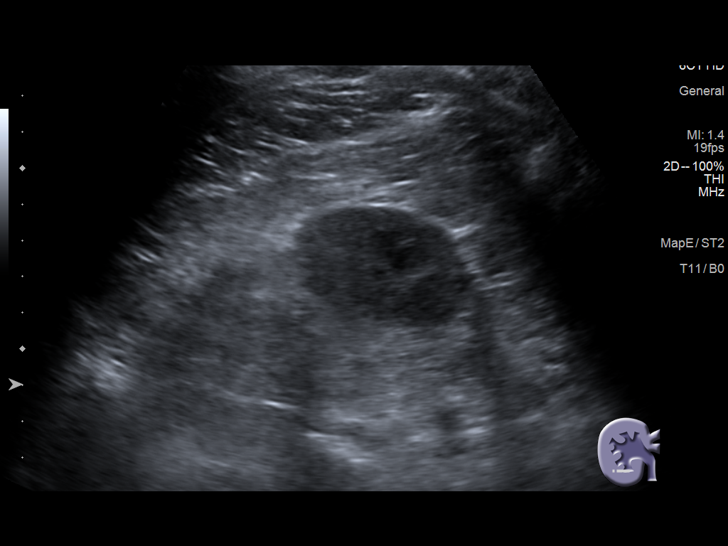
[im 14/37]
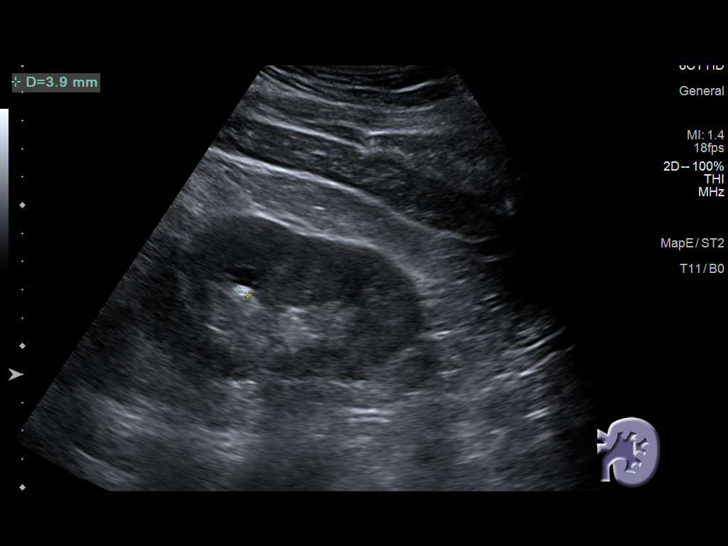
[im 17/37]
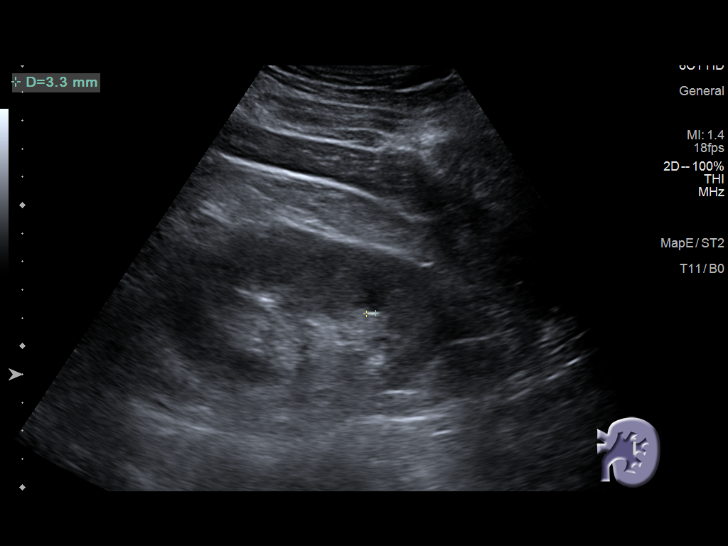
[im 20/37]
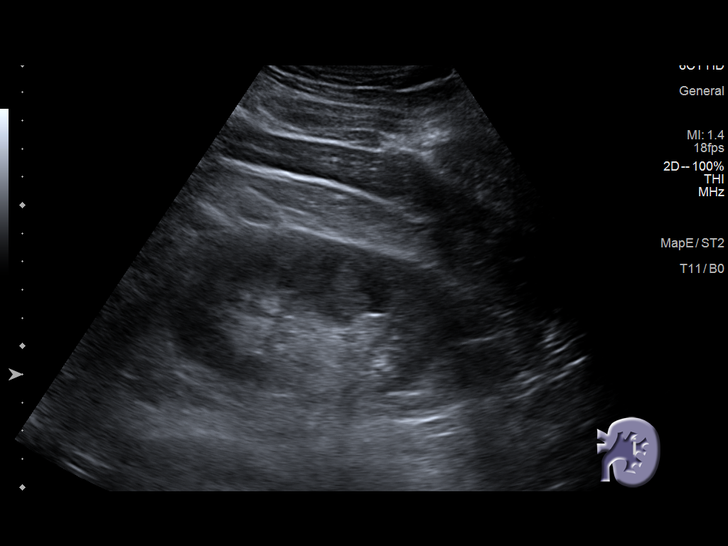
[im 23/37]
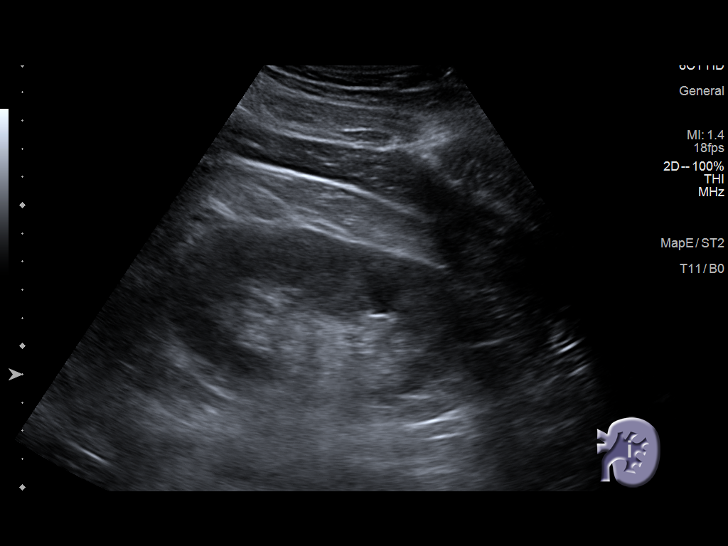
[im 25/37]
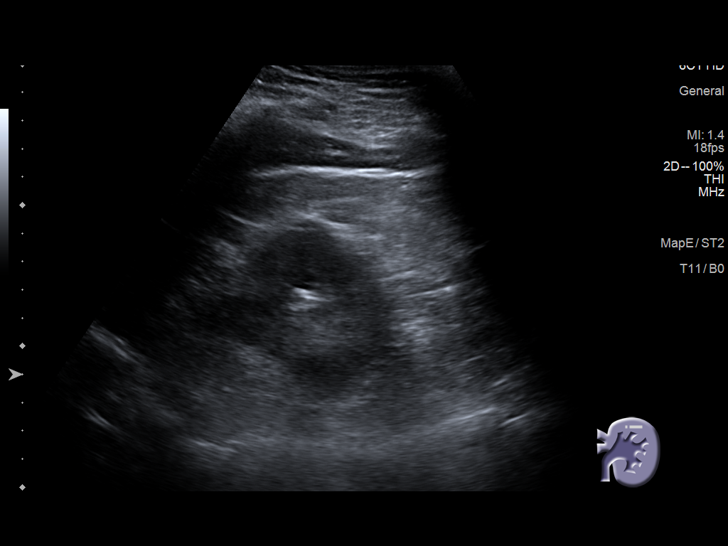
[im 28/37]
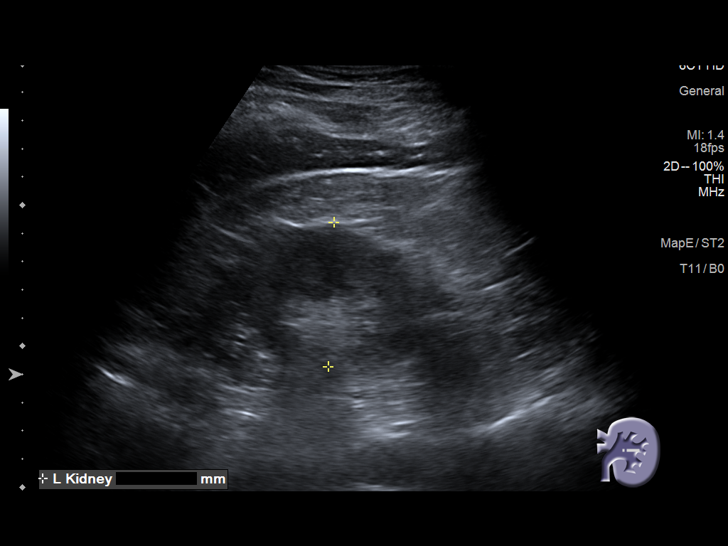
[im 31/37]
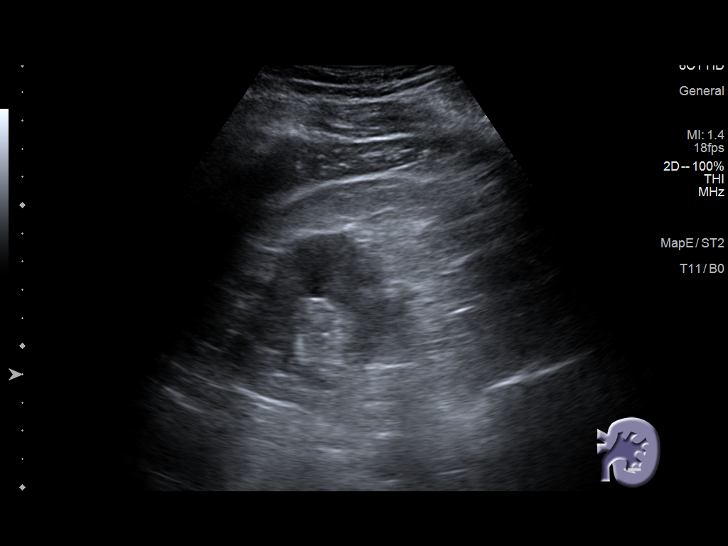
[im 34/37]
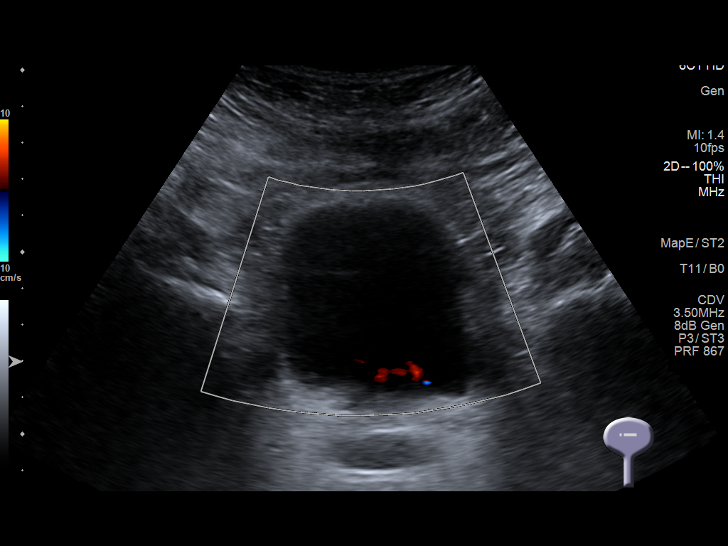
[im 37/37]
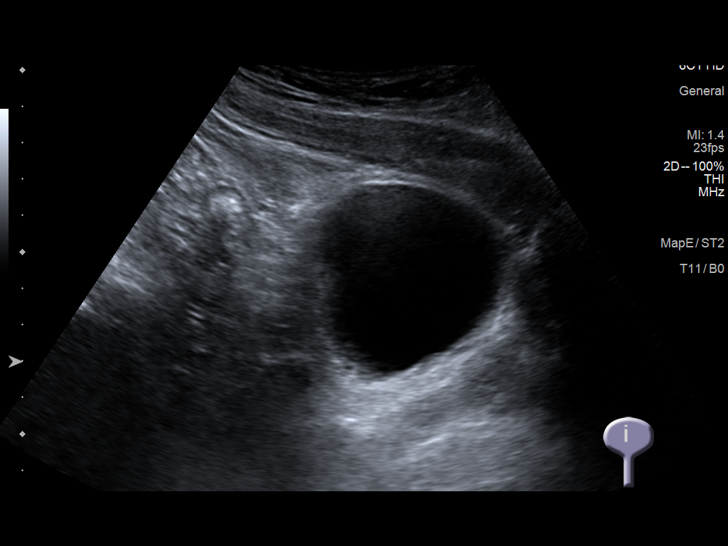

[14 of 25 positions shown; findings below may reference images not displayed]

FINDINGS: Right Kidney:

Renal measurements: 9.9 x 5.1 x 5.6 cm = volume: 147 mL .
Echogenicity and renal cortical thickness are within normal limits.
No mass, perinephric fluid, or hydronephrosis visualized. No
sonographically demonstrable calculus or ureterectasis.

Left Kidney:

Renal measurements: 10.4 x 5.6 x 5.1 cm = volume: 155 mL.
Echogenicity and renal cortical thickness are within normal limits.
No mass, perinephric fluid, or hydronephrosis visualized. There is a
3 mm calculus in the upper pole left kidney. No ureterectasis.

Bladder:

Appears normal for degree of bladder distention. Flow from each
distal ureter is seen within the bladder.

Other:

None.
IMPRESSION: 3 mm calculus upper pole left kidney.  Study otherwise unremarkable.

## 2020-06-10 ENCOUNTER — Other Ambulatory Visit (HOSPITAL_COMMUNITY): Payer: Self-pay

## 2020-06-10 MED FILL — AMOXICILLIN 500 MG CAPSULE: 500 | 7 days supply | Qty: 21 | Fill #0

## 2020-08-28 ENCOUNTER — Other Ambulatory Visit: Payer: Self-pay | Admitting: Osteopathic Medicine

## 2020-08-28 DIAGNOSIS — K529 Noninfective gastroenteritis and colitis, unspecified: Secondary | ICD-10-CM

## 2020-10-16 ENCOUNTER — Other Ambulatory Visit: Payer: Self-pay

## 2020-10-16 ENCOUNTER — Emergency Department
Admission: RE | Admit: 2020-10-16 | Discharge: 2020-10-16 | Disposition: A | Discharge status: Home / Self Care | Payer: 59 | Source: Ambulatory Visit | Attending: Family Medicine | Admitting: Family Medicine

## 2020-10-16 VITALS — BP 150/99 | HR 77 | Temp 98.2°F | Resp 17 | Ht 66.0 in | Wt 174.2 lb

## 2020-10-16 DIAGNOSIS — J3089 Other allergic rhinitis: Secondary | ICD-10-CM

## 2020-10-16 DIAGNOSIS — R0981 Nasal congestion: Secondary | ICD-10-CM

## 2020-10-16 HISTORY — DX: Chronic sinusitis, unspecified: J32.9

## 2020-10-16 MED ORDER — METHYLPREDNISOLONE SODIUM SUCC 125 MG IJ SOLR
80.0000 mg | Freq: Once | INTRAMUSCULAR | Status: AC
  Administered 2020-10-16: 80 mg via INTRAMUSCULAR

## 2020-10-16 MED ORDER — AMOXICILLIN-POT CLAVULANATE 875-125 MG PO TABS: 1.0000 | ORAL_TABLET | Freq: Two times a day (BID) | ORAL | 0 refills | Status: AC

## 2020-10-16 MED ORDER — PREDNISONE 20 MG PO TABS: ORAL_TABLET | ORAL | 0 refills | Status: DC

## 2020-10-16 NOTE — ED Provider Notes (Signed)
[name]    CSN: [id number] Arrival date & time: 10/16/20  1501      History   Chief Complaint Chief Complaint  Patient presents with  . Facial Pain    Chronic sinus issues  . Appointment    HPI Allen Fisher is a 45 y.o. male.   Patient has chronic perennial allergic rhinitis that flares up several times per year, despite taking two antihistamines and Flonase.  During the past week he has had increased sinus congestion and facial pressure but denies sore throat, fever, URI symptoms, etc.  He has a history of recurring sinusitis but he does not feel that he has infection at present.  The history is provided by the patient.    Past Medical History:  Diagnosis Date  . Environmental allergies   . Frequent sinus infections   . Pneumonia     Patient Active Problem List   Diagnosis Date Noted  . Status post cholecystectomy 12/13/2016  . Seasonal allergic rhinitis 12/13/2016  . Elevated blood pressure reading 12/13/2016  . Onychomycosis 12/13/2016  . Acne vulgaris 07/04/2014  . Hypogonadism male 06/05/2014  . Gilbert's syndrome 06/05/2014  . IBS (irritable bowel syndrome) 06/05/2014  . Obesity 06/05/2014  . Elevated LFTs 06/05/2014  . Asthma 10/12/2010    Past Surgical History:  Procedure Laterality Date  . BACK SURGERY    . CHOLECYSTECTOMY    . KNEE SURGERY    . SHOULDER SURGERY         Home Medications    Prior to Admission medications   Medication Sig Start Date End Date Taking? Authorizing Provider  amoxicillin-clavulanate (AUGMENTIN) 875-125 MG tablet Take 1 tablet by mouth every 12 (twelve) hours for 10 days. 10/16/20 10/26/20 Yes Lattie Haw, MD  cetirizine (ZYRTEC) 10 MG tablet Take 10 mg by mouth daily.   Yes [provider]  fexofenadine (ALLEGRA) 180 MG tablet Take 180 mg by mouth daily.   Yes [provider]  fluticasone (FLONASE) 50 MCG/ACT nasal spray Place into both nostrils daily.   Yes [provider]  predniSONE (DELTASONE) 20 MG tablet Take one tab by mouth twice daily for 4 days, then one daily for 3 days. Take with food. 10/16/20  Yes Lattie Haw, MD  testosterone cypionate (DEPOTESTOSTERONE CYPIONATE) 200 MG/ML injection Inject 1 mL (200 mg total) into the muscle every 14 (fourteen) days. 10/24/19 01/22/20 Yes Everrett Coombe, DO  WELCHOL 625 MG tablet TAKE 3 TABLETS BY MOUTH  TWICE DAILY WITH A MEAL 08/26/19  Yes Sunnie Nielsen, DO  albuterol (VENTOLIN HFA) 108 (90 Base) MCG/ACT inhaler Inhale 2 puffs twice a day x 3 days or until symptoms improve. May use 1-2 puff every 4 hr prn SOB/wheezing Patient not taking: Reported on 10/16/2020 08/19/19   Carlis Stable, PA-C  tamsulosin (FLOMAX) 0.4 MG CAPS capsule Take 1 capsule (0.4 mg total) by mouth daily. Patient not taking: Reported on 08/19/2019 07/08/19   Rolla Plate    Family History Family History  Problem Relation Age of Onset  . Cancer Mother        uterine  . Arrhythmia Mother     Social History Social History   Tobacco Use  . Smoking status: Never Smoker  . Smokeless tobacco: Current User    Types: Snuff  Vaping Use  . Vaping Use: Never used  Substance Use Topics  . Alcohol use: Yes    Alcohol/week: 0.0 standard drinks  . Drug use: No  Allergies   Patient has no known allergies.   Review of Systems Review of Systems No sore throat No cough No pleuritic pain No wheezing + nasal congestion + post-nasal drainage + sinus pain/pressure No itchy/red eyes No earache No hemoptysis No SOB No fever/chills No nausea No vomiting No abdominal pain No diarrhea No urinary symptoms No skin rash No fatigue No myalgias No headache   Physical Exam Triage Vital Signs ED Triage Vitals  Enc Vitals Group     BP 10/16/20 1548 (!) 150/99     Pulse Rate 10/16/20 1548 77     Resp 10/16/20 1548 17     Temp 10/16/20 1548 98.2 F (36.8 C)     Temp Source 10/16/20 1548 Oral     SpO2  10/16/20 1548 98 %     Weight 10/16/20 1551 174 lb 2.6 oz (79 kg)     Height 10/16/20 1551 5\' 6"  (1.676 m)     Head Circumference --      Peak Flow --      Pain Score 10/16/20 1549 4     Pain Loc --      Pain Edu? --      Excl. in GC? --    No data found.  Updated Vital Signs BP (!) 150/99 (BP Location: Right Arm)   Pulse 77   Temp 98.2 F (36.8 C) (Oral)   Resp 17   Ht 5\' 6"  (1.676 m)   Wt 79 kg   SpO2 98%   BMI 28.11 kg/m   Visual Acuity Right Eye Distance:   Left Eye Distance:   Bilateral Distance:    Right Eye Near:   Left Eye Near:    Bilateral Near:     Physical Exam Constitutional:      General: He is not in acute distress. HENT:     Head: Normocephalic.     Right Ear: Tympanic membrane, ear canal and external ear normal.     Left Ear: Tympanic membrane, ear canal and external ear normal.     Nose: Congestion present. No nasal tenderness.     Right Turbinates: Swollen.     Left Turbinates: Swollen.     Right Sinus: Maxillary sinus tenderness present.     Left Sinus: Maxillary sinus tenderness present.     Comments: Mild bilateral maxillary sinus tenderness is present.    Mouth/Throat:     Pharynx: Oropharynx is clear. No oropharyngeal exudate or posterior oropharyngeal erythema.  Eyes:     Conjunctiva/sclera: Conjunctivae normal.     Pupils: Pupils are equal, round, and reactive to light.  Cardiovascular:     Rate and Rhythm: Normal rate.  Pulmonary:     Effort: Pulmonary effort is normal.  Skin:    General: Skin is warm and dry.  Neurological:     Mental Status: He is alert and oriented to person, place, and time.      UC Treatments / Results  Labs (all labs ordered are listed, but only abnormal results are displayed) Labs Reviewed - No data to display  EKG   Radiology No results found.  Procedures Procedures (including critical care time)  Medications Ordered in UC Medications  methylPREDNISolone sodium succinate (SOLU-MEDROL)  125 mg/2 mL injection 80 mg (80 mg Intramuscular Given 10/16/20 1612)    Initial Impression / Assessment and Plan / UC Course  I have reviewed the triage vital signs and the nursing notes.  Pertinent labs & imaging results that were available during  my care of the patient were reviewed by me and considered in my medical decision making (see chart for details).    Patient does not appear to have sinusitis at this time. Administered Solumedrol 80mg  IM; then begin prednisone burst/taper. Followup with Family Doctor if not improved in one week.    Final Clinical Impressions(s) / UC Diagnoses   Final diagnoses:  Perennial allergic rhinitis  Nasal sinus congestion     Discharge Instructions     Begin prednisone Saturday 10/17/20. May add Pseudoephedrine (30mg , one or two every 4 to 6 hours) for sinus congestion.    Continue present allergy medications as prescribed. Begin Augmentin if symptoms worsen (given Rx to hold).    ED Prescriptions    Medication Sig Dispense Auth. Provider   predniSONE (DELTASONE) 20 MG tablet Take one tab by mouth twice daily for 4 days, then one daily for 3 days. Take with food. 11 tablet 12/17/20, MD   amoxicillin-clavulanate (AUGMENTIN) 875-125 MG tablet Take 1 tablet by mouth every 12 (twelve) hours for 10 days. 20 tablet , MD        Lattie Haw, MD 10/17/20 [phone]

## 2020-10-16 NOTE — ED Triage Notes (Signed)
Chronic allergies w/ sinus pressure x 1 week  OTC serums  Has seen an allergist Takes allergy  meds & nasal spray  COVID vaccine - no booster

## 2020-10-16 NOTE — Discharge Instructions (Addendum)
Begin prednisone Saturday 10/17/20. May add Pseudoephedrine (30mg , one or two every 4 to 6 hours) for sinus congestion.    Continue present allergy medications as prescribed. Begin Augmentin if symptoms worsen.

## 2020-11-07 ENCOUNTER — Encounter: Payer: Self-pay | Admitting: Nurse Practitioner

## 2020-11-07 ENCOUNTER — Telehealth: Payer: 59 | Admitting: Nurse Practitioner

## 2020-11-07 DIAGNOSIS — J301 Allergic rhinitis due to pollen: Secondary | ICD-10-CM

## 2020-11-07 MED ORDER — PREDNISONE 10 MG (21) PO TBPK: ORAL_TABLET | ORAL | 0 refills | Status: DC

## 2020-11-07 NOTE — Progress Notes (Signed)
Mr. wrangler, penning are scheduled for a virtual visit with your provider today.    Just as we do with appointments in the office, we must obtain your consent to participate.  Your consent will be active for this visit and any virtual visit you may have with one of our providers in the next 365 days.    If you have a MyChart account, I can also send a copy of this consent to you electronically.  All virtual visits are billed to your insurance company just like a traditional visit in the office.  As this is a virtual visit, video technology does not allow for your provider to perform a traditional examination.  This may limit your provider's ability to fully assess your condition.  If your provider identifies any concerns that need to be evaluated in person or the need to arrange testing such as labs, EKG, etc, we will make arrangements to do so.    Although advances in technology are sophisticated, we cannot ensure that it will always work on either your end or our end.  If the connection with a video visit is poor, we may have to switch to a telephone visit.  With either a video or telephone visit, we are not always able to ensure that we have a secure connection.   I need to obtain your verbal consent now.   Are you willing to proceed with your visit today?   Kashmir Lysaght has provided verbal consent on 11/07/2020 for a virtual visit (video or telephone).   Mary-Margaret Daphine Deutscher, [facility] 11/07/2020  9:04 AM   Virtual Visit via video Note   Due to COVID-19 pandemic this visit was conducted virtually. This visit type was conducted due to national recommendations for restrictions regarding the COVID-19 Pandemic (e.g. social distancing, sheltering in place) in an effort to limit this patient's exposure and mitigate transmission in our community. All issues noted in this document were discussed and addressed.  A physical exam was not performed with this format.  I connected with  [name]  on 11/07/20 at 8:50  by video and verified that I am speaking with the correct person using two identifiers. 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 The provider, Mary-Margaret Daphine Deutscher, FNP is located in their office at time of visit.  I discussed the limitations, risks, security and privacy concerns of performing an evaluation and management service by video  and the availability of in person appointments. I also discussed with the patient that there may be a patient responsible charge related to this service. The patient expressed understanding and agreed to proceed.   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 Since moving down here from up [place], he has had bad allergies. He I on flonace and claritin along with allergy eye drops daily. He usually has to do a round of prednisone to help 1 x a year.   Review of Systems  Constitutional: Negative for chills, diaphoresis, fever and weight loss.  HENT: Positive for congestion. Negative for sore throat.   Eyes: Negative for blurred vision, double vision and pain.  Respiratory: Negative for cough and shortness of breath.   Cardiovascular: Negative for chest pain, palpitations, orthopnea and leg swelling.  Gastrointestinal: Negative for abdominal pain.  Skin: Negative for rash.  Neurological: Negative for dizziness, sensory change, loss of consciousness, weakness and  headaches.  Endo/Heme/Allergies: Negative for polydipsia. Does not bruise/bleed easily.  Psychiatric/Behavioral: Negative for memory loss. The patient does not have insomnia.   All other systems reviewed and are negative.      Observations/Objective: Alert and oriented- answers all questions appropriately No distress Voice hoarse No cough noted   Assessment and Plan: Tranell Wojtkiewicz in today with chief complaint of Allergic Rhinitis    1. Seasonal  allergic rhinitis due to pollen contniue claritin, flonase and eye drops Avoid allergens when can  Meds ordered this encounter  Medications  . predniSONE (STERAPRED UNI-PAK 21 TAB) 10 MG (21) TBPK tablet    Sig: As directed x 6 days    Dispense:  21 tablet    Refill:  0    Order Specific Question:   Supervising Provider    Answer:   Eber Hong [[id number]]    Follow Up Instructions: *prn    I discussed the assessment and treatment plan with the patient. The patient was provided an opportunity to ask questions and all were answered. The patient agreed with the plan and demonstrated an understanding of the instructions.   The patient was advised to call back or seek an in-person evaluation if the symptoms worsen or if the condition fails to improve as anticipated.  The above assessment and management plan was discussed with the patient. The patient verbalized understanding of and has agreed to the management plan. Patient is aware to call the clinic if symptoms persist or worsen. Patient is aware when to return to the clinic for a follow-up visit. Patient educated on when it is appropriate to go to the emergency department.   Time call ended: 9:08  I provided 18 minutes of face-to-face time during this encounter.    Mary-Margaret Daphine Deutscher, FNP

## 2020-11-17 ENCOUNTER — Telehealth: Payer: Self-pay | Admitting: Osteopathic Medicine

## 2020-11-17 NOTE — Telephone Encounter (Signed)
I haven't seen him before. It's ok with me if he wants to switch. Not sure that my schedule availability with remain as open as it has been with the increased patient numbers though.

## 2020-11-17 NOTE — Telephone Encounter (Signed)
PT called asking to switch to [facility]. He said this is due to a full schedule and sometimes feeling rushed when he's seen. He's heard good things about [facility] and wanted to ask. He said, "nothing personal about Dr. Lyn Hollingshead, just heard a lot about [facility] and figured it would be easier"

## 2020-11-17 NOTE — Telephone Encounter (Signed)
last I saw him was 2019  So if he wants to establish w/ [facility] that's fine with me if fine w/ [facility] but also not the best reason to switch if he's not scheduling follow-ups / annuals in advance

## 2020-12-01 ENCOUNTER — Ambulatory Visit: Payer: 59 | Admitting: Medical-Surgical

## 2020-12-01 ENCOUNTER — Other Ambulatory Visit: Payer: Self-pay

## 2020-12-01 ENCOUNTER — Encounter: Payer: Self-pay | Admitting: Medical-Surgical

## 2020-12-01 ENCOUNTER — Ambulatory Visit: Payer: 59 | Admitting: Osteopathic Medicine

## 2020-12-01 VITALS — BP 128/86 | HR 88 | Temp 98.3°F | Ht 66.0 in | Wt 201.6 lb

## 2020-12-01 DIAGNOSIS — K529 Noninfective gastroenteritis and colitis, unspecified: Secondary | ICD-10-CM | POA: Diagnosis not present

## 2020-12-01 DIAGNOSIS — E6609 Other obesity due to excess calories: Secondary | ICD-10-CM | POA: Diagnosis not present

## 2020-12-01 DIAGNOSIS — Z6832 Body mass index (BMI) 32.0-32.9, adult: Secondary | ICD-10-CM

## 2020-12-01 DIAGNOSIS — Z7689 Persons encountering health services in other specified circumstances: Secondary | ICD-10-CM

## 2020-12-01 DIAGNOSIS — J302 Other seasonal allergic rhinitis: Secondary | ICD-10-CM

## 2020-12-01 MED ORDER — PHENTERMINE HCL 37.5 MG PO TABS: ORAL_TABLET | ORAL | 0 refills | Status: DC

## 2020-12-01 MED ORDER — COLESEVELAM HCL 625 MG PO TABS: ORAL_TABLET | ORAL | 3 refills | Status: DC

## 2020-12-01 NOTE — Progress Notes (Signed)
Subjective:    CC: Establish care, seasonal allergies, chronic diarrhea  HPI: Very pleasant 45 year old male presenting today to establish care with a new PCP and discuss the following issues:  Severe seasonal allergies-currently taking levocetirizine as prescribed but has a history of taking cetirizine as well as Allegra.  Also using Flonase regularly.  Notes that if there is an allergy pill, he has taken that or is currently on it.  Recent video visit and urgent care visits for severe allergies/sinus infections.  Notes that he gets prednisone injections and tapers at least once a year due to the severity of his allergies.  Has an extensive history doing allergy shots but they were not effective.  Chronic diarrhea-has experienced chronic diarrhea since he had his gallbladder removed several years ago.  Has been taking WelChol 625 mg as prescribed, tolerating well.  This medication helped tremendously and his bowel function is Normal as long as he takes this.  Weight gain-notes that he has had weight gain over the last several months that he cannot seem to lose despite exercising regularly and making some dietary changes.  Is interested in a medication that will help him get the weight off so his blood pressure will return to normal.  I reviewed the past medical history, family history, social history, surgical history, and allergies today and no changes were needed.  Please see the problem list section below in epic for further details.  Past Medical History: Past Medical History:  Diagnosis Date  . Environmental allergies   . Frequent sinus infections   . Pneumonia    Past Surgical History: Past Surgical History:  Procedure Laterality Date  . BACK SURGERY    . CHOLECYSTECTOMY    . KNEE SURGERY    . SHOULDER SURGERY     Social History: Social History   Socioeconomic History  . Marital status: Married    Spouse name: Not on file  . Number of children: Not on file  . Years of  education: Not on file  . Highest education level: Not on file  Occupational History  . Not on file  Tobacco Use  . Smoking status: Never Smoker  . Smokeless tobacco: Current User    Types: Snuff  Vaping Use  . Vaping Use: Never used  Substance and Sexual Activity  . Alcohol use: Yes    Alcohol/week: 0.0 standard drinks  . Drug use: No  . Sexual activity: Yes    Partners: Female  Other Topics Concern  . Not on file  Social History Narrative  . Not on file   Social Determinants of Health   Financial Resource Strain: Not on file  Food Insecurity: Not on file  Transportation Needs: Not on file  Physical Activity: Not on file  Stress: Not on file  Social Connections: Not on file   Family History: Family History  Problem Relation Age of Onset  . Cancer Mother        uterine  . Arrhythmia Mother    Allergies: No Known Allergies Medications: See med rec.  Review of Systems: See HPI for pertinent positives and negatives.   Objective:    General: Well Developed, well nourished, and in no acute distress.  Neuro: Alert and oriented x3.  HEENT: Normocephalic, atraumatic.  Skin: Warm and dry. Cardiac: Regular rate and rhythm, no murmurs rubs or gallops, no lower extremity edema.  Respiratory: Clear to auscultation bilaterally. Not using accessory muscles, speaking in full sentences.  Impression and Recommendations:  1. Chronic diarrhea Continue WelChol 625 mg 3 tablets twice daily with meals.  Refills sent in for the next year. - colesevelam (WELCHOL) 625 MG tablet; TAKE 3 TABLETS BY MOUTH  TWICE DAILY WITH A MEAL  Dispense: 540 tablet; Refill: 3  2. Class 1 obesity due to excess calories with serious comorbidity and body mass index (BMI) of 32.0 to 32.9 in adult Discussed weight loss options.  He would like to give phentermine a try.  There are no contraindications so sending phentermine in with instructions to take one half tab daily and follow-up in 4 weeks for an  in person visit.  Weight loss tips and tricks information provided with AVS.  3. Seasonal allergic rhinitis, unspecified trigger He is holding steady with his current treatment for now.  Holding off on prednisone taper.  If his allergies worsen, he will let me know.  Return in about 4 weeks (around 12/29/2020) for weight check. ___________________________________________ Thayer Ohm, DNP, APRN, FNP-BC Primary Care and Sports Medicine [facility]

## 2020-12-01 NOTE — Patient Instructions (Signed)
Weight loss tips and tricks:  1.  Make sure to drink at least 64 ounces of water every day. 2.  Avoid alcohol. 3.  Avoid eating within 3 hours of going to bed. 4.  Cut out sugary drinks such as sweet tea, regular sodas, energy drinks, etc. 5.  Make sure you are getting enough sleep every night. 6.  Start making changes by cutting back portion sizes by 1/3. 7.  Keep a food diary to help identify areas for improvement and promote awareness of bad habits. 8.  Increase your activity.  Choose something you like to do that is fun for you so you are more likely to stick with it. 9.  Do not forget your protein! 10.  Measure your neck, upper arms, waist, hips, and thighs and write those measurements down somewhere before you start your weight loss journey.  Changes in your measurements will tell a far more accurate story than the number on a scale. 11.  As you go, pay attention to how your clothes fit! 12.  Weigh yourself as often or as little as you need to.  Some folks do better weighing every day while others do better with once a week. 13.  Do not get discouraged!  Weight loss efforts are meant to be lifestyle changes.  Once you stop a medication (if you are taking 1), your behaviors and habits will determine if you maintain your weight loss or not.  For those on medications:  1.  Take your medication as prescribed every day first thing in the morning. 2.  Common side effects include nausea and constipation.  To combat this, increased your daily water consumption.  Consider adding in a stool softener (available OTC) if needed.  Also increase fiber intake with vegetables and fruits. 3.  If you have any side effects or concerns while taking medication, please do not hesitate to reach out to us here at the office. 4.  While on weight loss medications, we do require you to follow-up every 4 weeks with an in office visit.  Refills will not be called in early on controlled substances.  Good luck on your  weight loss journey!  Have faith in your self and you will reach your goals!  

## 2020-12-02 ENCOUNTER — Ambulatory Visit: Payer: 59 | Admitting: Medical-Surgical

## 2020-12-19 ENCOUNTER — Telehealth: Payer: 59 | Admitting: Nurse Practitioner

## 2020-12-19 DIAGNOSIS — J069 Acute upper respiratory infection, unspecified: Secondary | ICD-10-CM | POA: Diagnosis not present

## 2020-12-19 MED ORDER — BENZONATATE 100 MG PO CAPS: 100.0000 mg | ORAL_CAPSULE | Freq: Three times a day (TID) | ORAL | 0 refills | Status: DC | PRN

## 2020-12-19 NOTE — Progress Notes (Signed)

## 2020-12-20 ENCOUNTER — Other Ambulatory Visit: Payer: Self-pay

## 2020-12-20 ENCOUNTER — Emergency Department
Admission: RE | Admit: 2020-12-20 | Discharge: 2020-12-20 | Disposition: A | Discharge status: Home / Self Care | Payer: 59 | Source: Ambulatory Visit

## 2020-12-20 ENCOUNTER — Emergency Department (INDEPENDENT_AMBULATORY_CARE_PROVIDER_SITE_OTHER): Payer: 59

## 2020-12-20 VITALS — BP 117/81 | HR 103 | Temp 98.6°F | Resp 20 | Ht 66.0 in | Wt 185.0 lb

## 2020-12-20 DIAGNOSIS — J0100 Acute maxillary sinusitis, unspecified: Secondary | ICD-10-CM

## 2020-12-20 DIAGNOSIS — R059 Cough, unspecified: Secondary | ICD-10-CM | POA: Diagnosis not present

## 2020-12-20 MED ORDER — BENZONATATE 200 MG PO CAPS: 200.0000 mg | ORAL_CAPSULE | Freq: Three times a day (TID) | ORAL | 0 refills | Status: AC | PRN

## 2020-12-20 MED ORDER — PREDNISONE 20 MG PO TABS: ORAL_TABLET | ORAL | 0 refills | Status: DC

## 2020-12-20 MED ORDER — CEFDINIR 300 MG PO CAPS: 300.0000 mg | ORAL_CAPSULE | Freq: Two times a day (BID) | ORAL | 0 refills | Status: AC

## 2020-12-20 NOTE — Discharge Instructions (Addendum)
Instructed/advised patient take medications with food to completion.  Increase daily water intake while taking these medications.

## 2020-12-20 NOTE — ED Triage Notes (Signed)
Pt presents to Urgent Care with c/o cough x several days, worsened yesterday. Also developed body aches yesterday. Reports negative home COVID test. Not vaccinated against flu.

## 2020-12-20 NOTE — ED Provider Notes (Signed)
[facility]    CSN: [id number] Arrival date & time: 12/20/20  1040      History   Chief Complaint Chief Complaint  Patient presents with  . Cough  . Generalized Body Aches    HPI Allen Fisher is a 45 y.o. male.   HPI Pleasant 45 year old male presents with nonproductive cough for 4-5 days, worsening for the last day.  Reports body aches began yesterday, tested negative on home COVID-19 test, reports he is currently not vaccinated for influenza this year.  Past Medical History:  Diagnosis Date  . Environmental allergies   . Frequent sinus infections   . Pneumonia     Patient Active Problem List   Diagnosis Date Noted  . Status post cholecystectomy 12/13/2016  . Seasonal allergic rhinitis 12/13/2016  . Elevated blood pressure reading 12/13/2016  . Onychomycosis 12/13/2016  . Acne vulgaris 07/04/2014  . Hypogonadism male 06/05/2014  . Gilbert's syndrome 06/05/2014  . IBS (irritable bowel syndrome) 06/05/2014  . Obesity 06/05/2014  . Elevated LFTs 06/05/2014  . Asthma 10/12/2010    Past Surgical History:  Procedure Laterality Date  . BACK SURGERY    . CHOLECYSTECTOMY    . KNEE SURGERY    . SHOULDER SURGERY         Home Medications    Prior to Admission medications   Medication Sig Start Date End Date Taking? Authorizing Provider  benzonatate (TESSALON) 200 MG capsule Take 1 capsule (200 mg total) by mouth 3 (three) times daily as needed for up to 7 days for cough. 12/20/20 12/27/20 Yes Trevor Iha, FNP  cefdinir (OMNICEF) 300 MG capsule Take 1 capsule (300 mg total) by mouth 2 (two) times daily for 7 days. 12/20/20 12/27/20 Yes Trevor Iha, FNP  predniSONE (DELTASONE) 20 MG tablet Take 3 tabs p.o. x 5 days. 12/20/20  Yes Trevor Iha, FNP  cetirizine (ZYRTEC) 10 MG tablet Take 10 mg by mouth daily.    [provider]  colesevelam (WELCHOL) 625 MG tablet TAKE 3 TABLETS BY MOUTH  TWICE DAILY WITH A MEAL 12/01/20   Christen Butter, NP   fexofenadine (ALLEGRA) 180 MG tablet Take 180 mg by mouth daily.    [provider]  fluticasone (FLONASE) 50 MCG/ACT nasal spray Place into both nostrils daily.    [provider]  phentermine (ADIPEX-P) 37.5 MG tablet One half tab by mouth q AM 12/01/20   Christen Butter, NP  tamsulosin (FLOMAX) 0.4 MG CAPS capsule Take 1 capsule (0.4 mg total) by mouth daily. 07/08/19   Lurene Shadow, PA-C  testosterone cypionate (DEPOTESTOSTERONE CYPIONATE) 200 MG/ML injection Inject 1 mL (200 mg total) into the muscle every 14 (fourteen) days. 10/24/19 01/22/20  Everrett Coombe, DO    Family History Family History  Problem Relation Age of Onset  . Cancer Mother        uterine  . Arrhythmia Mother   . Healthy Father     Social History Social History   Tobacco Use  . Smoking status: Never Smoker  . Smokeless tobacco: Current User    Types: Snuff  Vaping Use  . Vaping Use: Never used  Substance Use Topics  . Alcohol use: Yes    Alcohol/week: 0.0 standard drinks    Comment: occasionally  . Drug use: No     Allergies   Patient has no known allergies.   Review of Systems Review of Systems  Constitutional: Negative.   HENT: Negative.   Eyes: Negative.   Respiratory: Positive  for cough.   Cardiovascular: Negative.   Gastrointestinal: Negative.   Genitourinary: Negative.   Musculoskeletal: Negative.   Skin: Negative.   Neurological: Negative.      Physical Exam Triage Vital Signs ED Triage Vitals  Enc Vitals Group     BP 12/20/20 1101 117/81     Pulse Rate 12/20/20 1101 (!) 103     Resp 12/20/20 1101 20     Temp 12/20/20 1101 98.6 F (37 C)     Temp Source 12/20/20 1101 Oral     SpO2 12/20/20 1101 96 %     Weight 12/20/20 1057 185 lb (83.9 kg)     Height 12/20/20 1057 5\' 6"  (1.676 m)     Head Circumference --      Peak Flow --      Pain Score 12/20/20 1056 5     Pain Loc --      Pain Edu? --      Excl. in GC? --    No data found.  Updated Vital  Signs BP 117/81 (BP Location: Right Arm)   Pulse (!) 103   Temp 98.6 F (37 C) (Oral)   Resp 20   Ht 5\' 6"  (1.676 m)   Wt 185 lb (83.9 kg)   SpO2 96%   BMI 29.86 kg/m   Visual Acuity Right Eye Distance:   Left Eye Distance:   Bilateral Distance:    Right Eye Near:   Left Eye Near:    Bilateral Near:     Physical Exam Vitals and nursing note reviewed.  Constitutional:      General: He is not in acute distress.    Appearance: Normal appearance. He is not ill-appearing.  HENT:     Head: Normocephalic and atraumatic.     Right Ear: Tympanic membrane and ear canal normal.     Left Ear: Tympanic membrane and ear canal normal.     Nose: Nose normal.     Mouth/Throat:     Mouth: Mucous membranes are moist.     Pharynx: Oropharynx is clear.  Eyes:     Extraocular Movements: Extraocular movements intact.     Conjunctiva/sclera: Conjunctivae normal.     Pupils: Pupils are equal, round, and reactive to light.  Pulmonary:     Effort: Pulmonary effort is normal.     Breath sounds: Normal breath sounds.     Comments: No adventitious breath sounds, infrequent nonproductive cough noted Skin:    General: Skin is warm and dry.  Neurological:     General: No focal deficit present.     Mental Status: He is alert and oriented to person, place, and time.  Psychiatric:        Mood and Affect: Mood normal.        Behavior: Behavior normal.      UC Treatments / Results  Labs (all labs ordered are listed, but only abnormal results are displayed) Labs Reviewed  COVID-19, FLU A+B NAA    EKG   Radiology DG Chest 2 View  Result Date: 12/20/2020 CLINICAL DATA:  Cough for several days EXAM: CHEST - 2 VIEW COMPARISON:  12/10/2018 chest radiograph. FINDINGS: Stable cardiomediastinal silhouette with normal heart size. No pneumothorax. No pleural effusion. Lungs appear clear, with no acute consolidative airspace disease and no pulmonary edema. Surgical clips are seen in the right upper  quadrant of the abdomen. IMPRESSION: No active cardiopulmonary disease. Electronically Signed   By: 02/19/2021 M.D.   On: 12/20/2020 12:08  Procedures Procedures (including critical care time)  Medications Ordered in UC Medications - No data to display  Initial Impression / Assessment and Plan / UC Course  I have reviewed the triage vital signs and the nursing notes.  Pertinent labs & imaging results that were available during my care of the patient were reviewed by me and considered in my medical decision making (see chart for details).     1. Cough. 2.  Maxillary sinusitis. Patient discharged home, hemodynamically stable.  Advised patient to start cefdinir and prednisone today. Final Clinical Impressions(s) / UC Diagnoses   Final diagnoses:  Cough  Acute maxillary sinusitis, recurrence not specified     Discharge Instructions     Instructed/advised patient take medications with food to completion.  Increase daily water intake while taking these medications.    ED Prescriptions    Medication Sig Dispense Auth. Provider   cefdinir (OMNICEF) 300 MG capsule Take 1 capsule (300 mg total) by mouth 2 (two) times daily for 7 days. 14 capsule Trevor Iha, FNP   predniSONE (DELTASONE) 20 MG tablet Take 3 tabs p.o. x 5 days. 15 tablet Trevor Iha, FNP   benzonatate (TESSALON) 200 MG capsule Take 1 capsule (200 mg total) by mouth 3 (three) times daily as needed for up to 7 days for cough. 30 capsule Trevor Iha, FNP     PDMP not reviewed this encounter.   Zebulun, Deman, FNP 12/20/20 1341

## 2020-12-21 LAB — COVID-19, FLU A+B NAA
Influenza A, NAA: NOT DETECTED
Influenza B, NAA: NOT DETECTED
SARS-CoV-2, NAA: DETECTED — AB

## 2020-12-30 ENCOUNTER — Encounter: Payer: Self-pay | Admitting: Medical-Surgical

## 2020-12-30 ENCOUNTER — Ambulatory Visit: Payer: 59 | Admitting: Medical-Surgical

## 2021-01-18 ENCOUNTER — Ambulatory Visit
Admission: RE | Admit: 2021-01-18 | Discharge: 2021-01-18 | Disposition: A | Discharge status: Home / Self Care | Payer: No Typology Code available for payment source | Source: Ambulatory Visit | Attending: Nurse Practitioner | Admitting: Nurse Practitioner

## 2021-01-18 ENCOUNTER — Other Ambulatory Visit: Payer: Self-pay | Admitting: Nurse Practitioner

## 2021-01-18 DIAGNOSIS — M542 Cervicalgia: Secondary | ICD-10-CM

## 2021-01-18 DIAGNOSIS — M545 Low back pain, unspecified: Secondary | ICD-10-CM

## 2021-03-18 ENCOUNTER — Other Ambulatory Visit (HOSPITAL_COMMUNITY): Payer: Self-pay

## 2021-03-18 MED ORDER — TESTOSTERONE CYPIONATE 200 MG/ML IM SOLN
INTRAMUSCULAR | 3 refills | Status: DC
  Filled 2021-03-18: qty 2, 30d supply, fill #0
  Filled 2021-03-29: qty 2, 28d supply, fill #0

## 2021-03-26 ENCOUNTER — Other Ambulatory Visit (HOSPITAL_COMMUNITY): Payer: Self-pay

## 2021-03-29 ENCOUNTER — Other Ambulatory Visit (HOSPITAL_COMMUNITY): Payer: Self-pay

## 2021-03-30 ENCOUNTER — Other Ambulatory Visit (HOSPITAL_COMMUNITY): Payer: Self-pay

## 2021-04-22 ENCOUNTER — Ambulatory Visit: Payer: 59 | Admitting: Medical-Surgical

## 2021-04-22 ENCOUNTER — Other Ambulatory Visit (HOSPITAL_COMMUNITY): Payer: Self-pay

## 2021-04-22 ENCOUNTER — Encounter: Payer: Self-pay | Admitting: Medical-Surgical

## 2021-04-22 VITALS — BP 166/99 | HR 77 | Resp 20 | Ht 66.0 in | Wt 197.0 lb

## 2021-04-22 DIAGNOSIS — E6609 Other obesity due to excess calories: Secondary | ICD-10-CM | POA: Diagnosis not present

## 2021-04-22 DIAGNOSIS — G4486 Cervicogenic headache: Secondary | ICD-10-CM | POA: Diagnosis not present

## 2021-04-22 DIAGNOSIS — M542 Cervicalgia: Secondary | ICD-10-CM

## 2021-04-22 DIAGNOSIS — Z6832 Body mass index (BMI) 32.0-32.9, adult: Secondary | ICD-10-CM

## 2021-04-22 DIAGNOSIS — R0683 Snoring: Secondary | ICD-10-CM | POA: Diagnosis not present

## 2021-04-22 MED ORDER — MELOXICAM 15 MG PO TABS
15.0000 mg | ORAL_TABLET | Freq: Every day | ORAL | 0 refills | Status: DC
  Filled 2021-04-22: qty 30, 30d supply, fill #0

## 2021-04-22 MED ORDER — PHENTERMINE HCL 37.5 MG PO TABS
ORAL_TABLET | ORAL | 0 refills | Status: DC
  Filled 2021-04-22: qty 15, 30d supply, fill #0

## 2021-04-22 MED ORDER — PREDNISONE 50 MG PO TABS
50.0000 mg | ORAL_TABLET | Freq: Every day | ORAL | 0 refills | Status: DC
  Filled 2021-04-22: qty 5, 5d supply, fill #0

## 2021-04-22 MED ORDER — BACLOFEN 10 MG PO TABS
10.0000 mg | ORAL_TABLET | Freq: Three times a day (TID) | ORAL | 0 refills | Status: DC | PRN
  Filled 2021-04-22: qty 30, 10d supply, fill #0
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
## 2021-04-28 ENCOUNTER — Encounter: Payer: Self-pay | Admitting: Rehabilitative and Restorative Service Providers"

## 2021-04-28 ENCOUNTER — Other Ambulatory Visit: Payer: Self-pay

## 2021-04-28 ENCOUNTER — Ambulatory Visit: Payer: 59 | Admitting: Rehabilitative and Restorative Service Providers"

## 2021-04-28 DIAGNOSIS — M542 Cervicalgia: Secondary | ICD-10-CM | POA: Diagnosis not present

## 2021-04-28 DIAGNOSIS — R29898 Other symptoms and signs involving the musculoskeletal system: Secondary | ICD-10-CM | POA: Diagnosis not present

## 2021-04-28 DIAGNOSIS — R293 Abnormal posture: Secondary | ICD-10-CM | POA: Diagnosis not present

## 2021-04-28 DIAGNOSIS — R519 Headache, unspecified: Secondary | ICD-10-CM | POA: Diagnosis not present
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
## 2021-04-28 NOTE — Patient Instructions (Signed)
Access Code: [email or web address] URL: [email or web address] Date: 04/28/2021 Prepared by: Corlis Leak  Exercises Seated Scapular Retraction - 2 x daily - 7 x weekly - 1-2 sets - 10 reps - 10 sec hold  Patient Education TENS Unit Trigger Point Dry Needling Posture and Body Mechanics Office Posture

## 2021-05-04 ENCOUNTER — Other Ambulatory Visit: Payer: Self-pay

## 2021-05-04 ENCOUNTER — Ambulatory Visit: Payer: 59 | Admitting: Rehabilitative and Restorative Service Providers"

## 2021-05-04 DIAGNOSIS — R29898 Other symptoms and signs involving the musculoskeletal system: Secondary | ICD-10-CM | POA: Diagnosis not present

## 2021-05-04 DIAGNOSIS — R293 Abnormal posture: Secondary | ICD-10-CM

## 2021-05-04 DIAGNOSIS — R519 Headache, unspecified: Secondary | ICD-10-CM | POA: Diagnosis not present

## 2021-05-04 DIAGNOSIS — M542 Cervicalgia: Secondary | ICD-10-CM

## 2021-05-04 NOTE — Patient Instructions (Signed)
Access Code: [email or web address] URL: [email or web address] Date: 05/04/2021 Prepared by: Margretta Ditty  Exercises Doorway Pec Stretch at 120 Degrees Abduction - 2 x daily - 7 x weekly - 1 sets - 3 reps - 30 seconds hold Seated Scapular Retraction - 2 x daily - 7 x weekly - 1-2 sets - 10 reps - 10 sec hold First Rib Mobilization with Strap - 2 x daily - 7 x weekly - 1 sets - 3 reps - 30 seconds hold Prone Scapular Retraction Y - 2 x daily - 7 x weekly - 1 sets - 10 reps Supine Thoracic Mobilization Towel Roll Vertical with Arm Stretch - 2 x daily - 7 x weekly - 1 sets - 1 reps - 2 minutes hold

## 2021-05-04 NOTE — Therapy (Signed)
[facility] [place] [address]  [address] [place], [place], [address] Phone: [phone]   Fax:  [phone]  Physical Therapy Treatment  Patient Details  Name: Allen Fisher MRN: [id number] Date of Birth: 07-23-1976 Referring Provider (PT): Christen Butter, NP   Encounter Date: 05/04/2021   PT End of Session - 05/04/21 0809     Visit Number 2    Number of Visits 12    Date for PT Re-Evaluation 06/09/21    PT Start Time 0804    PT Stop Time 0845    PT Time Calculation (min) 41 min    Activity Tolerance Patient tolerated treatment well             Past Medical History:  Diagnosis Date   Environmental allergies    Frequent sinus infections    Pneumonia     Past Surgical History:  Procedure Laterality Date   BACK SURGERY     CHOLECYSTECTOMY     KNEE SURGERY     SHOULDER SURGERY      There were no vitals filed for this visit.   Subjective Assessment - 05/04/21 0803     Subjective The patient notes he is still tight in his neck.    Pertinent History HTN; knee scope bilat; bilat shoulder sx Rt x 2, (last shoulder surgery ~ 3 yrs ago) Lt x 1 for labrum repair; lumbar discetomy; gall bladder surgery    Patient Stated Goals get rid of neck pain and headaches    Currently in Pain? Yes    Pain Score 3     Pain Location Neck    Pain Orientation Right    Pain Descriptors / Indicators Tightness;Sore    Pain Onset More than a month ago    Pain Frequency Intermittent    Aggravating Factors  looking down; in the morning; lifting    Pain Relieving Factors ice, meds, heat                [facility] PT Assessment - 05/04/21 0809       Assessment   Medical Diagnosis Cervicalgia; headaches    Referring Provider (PT) Christen Butter, NP    Onset Date/Surgical Date 01/13/21    Hand Dominance Right                           [facility] Adult PT Treatment/Exercise - 05/04/21 0813       Exercises   Exercises Neck;Shoulder       Neck Exercises: Theraband   Rows 10 reps;Green      Neck Exercises: Seated   Cervical Isometrics Right lateral flexion;Extension;5 secs;5 reps    Neck Retraction 5 reps      Neck Exercises: Supine   Neck Retraction 10 reps      Neck Exercises: Sidelying   Other Sidelying Exercise open book for thoracic opening      Neck Exercises: Stretches   Other Neck Stretches thoracic extension stretch x 1 reps x 2 minutes    Other Neck Stretches first rib mobilization for deeper scalene stretch      Shoulder Exercises: Supine   Horizontal ABduction Strengthening;10 reps      Shoulder Exercises: Prone   Flexion Strengthening;Both;10 reps    Flexion Limitations "Y" x 10 reps      Manual Therapy   Manual Therapy Soft tissue mobilization    Manual therapy comments skilled palpation toassess response to DN and manual work  Soft tissue mobilization STM R rhomboids, periscapular region, scalenes, suboccipitals, upper trap, levator              Trigger Point Dry Needling - 05/04/21 1256     Consent Given? Yes    Education Handout Provided Previously provided    Muscles Treated Head and Neck Upper trapezius;Levator scapulae    Muscles Treated Upper Quadrant Rhomboids;Subscapularis    Dry Needling Comments right side    Upper Trapezius Response Twitch reponse elicited;Palpable increased muscle length    Levator Scapulae Response Twitch response elicited;Palpable increased muscle length    Rhomboids Response Palpable increased muscle length    Subscapularis Response Palpable increased muscle length                   PT Education - 05/04/21 1244     Education Details progression of HEP    Person(s) Educated Patient    Methods Explanation;Demonstration;Handout    Comprehension Verbalized understanding;Returned demonstration                 PT Long Term Goals - 04/28/21 0916       PT LONG TERM GOAL #1   Title Patient reports full resolution of headaches and Rt  shoulder girdle pain    Time 6    Period Weeks    Status New    Target Date 06/09/21      PT LONG TERM GOAL #2   Title Improve posture and alignment with patient to demonstrate improved upright posture with posterior shoulder girdle musculature and core engaged    Time 6    Period Weeks    Status New    Target Date 06/09/21      PT LONG TERM GOAL #3   Title Full painfree cervical ROM/mobility    Time 6    Period Weeks    Status New    Target Date 06/09/21      PT LONG TERM GOAL #4   Title Independent in HEP (including aquatic program as indicated)    Time 6    Period Weeks    Status New    Target Date 06/09/21      PT LONG TERM GOAL #5   Title Improve functional limitation score to 73    Time 6    Period Weeks    Status New    Target Date 06/09/21                   Plan - 05/04/21 1245     Clinical Impression Statement The patient notes "tightness" in R cervical musculature.  He has trigger point in lower rhomboids and subscapularis, as well as myofascial band in upper trap.  PT to progress to patient tolerance.    PT Treatment/Interventions ADLs/Self Care Home Management;Aquatic Therapy;Cryotherapy;Electrical Stimulation;Iontophoresis 4mg /ml Dexamethasone;Moist Heat;Ultrasound;Therapeutic activities;Therapeutic exercise;Neuromuscular re-education;Patient/family education;Manual techniques;Passive range of motion;Dry needling;Taping    PT Next Visit Plan review HEP; add axial extension, pec stretch, posterior shoulder girdle strengthening; assess response to DN and manual work; modalities as indicated (trial of TENS for home purchase)    PT Home Exercise Plan [email or web address]    Consulted and Agree with Plan of Care Patient             Patient will benefit from skilled therapeutic intervention in order to improve the following deficits and impairments:     Visit Diagnosis: Cervicalgia  Frequent headaches  Other symptoms and signs involving the musculoskeletal  system  Abnormal posture  Problem List Patient Active Problem List   Diagnosis Date Noted   Cervicogenic headache 04/22/2021   Loud snoring 04/22/2021   Status post cholecystectomy 12/13/2016   Seasonal allergic rhinitis 12/13/2016   Elevated blood pressure reading 12/13/2016   Onychomycosis 12/13/2016   Acne vulgaris 07/04/2014   Hypogonadism male 06/05/2014   Gilbert's syndrome 06/05/2014   IBS (irritable bowel syndrome) 06/05/2014   Class 1 obesity due to excess calories with serious comorbidity and body mass index (BMI) of 32.0 to 32.9 in adult 06/05/2014   Elevated LFTs 06/05/2014   Asthma 10/12/2010    Anarosa Kubisiak, PT 05/04/2021, 12:57 PM  [facility] [address] [place] [address] [place], [place], [address] Phone: [phone]   Fax:  [phone]  Name: Allen Fisher MRN: [id number] Date of Birth: 22-Sep-1975

## 2021-05-06 ENCOUNTER — Ambulatory Visit: Payer: 59

## 2021-05-10 ENCOUNTER — Encounter: Payer: 59 | Admitting: Rehabilitative and Restorative Service Providers"

## 2021-05-10 ENCOUNTER — Encounter: Payer: Self-pay | Admitting: Medical-Surgical

## 2021-05-10 DIAGNOSIS — R0683 Snoring: Secondary | ICD-10-CM

## 2021-05-12 ENCOUNTER — Encounter: Payer: Self-pay | Admitting: Rehabilitative and Restorative Service Providers"

## 2021-05-12 ENCOUNTER — Ambulatory Visit: Payer: 59 | Admitting: Rehabilitative and Restorative Service Providers"

## 2021-05-12 ENCOUNTER — Other Ambulatory Visit: Payer: Self-pay

## 2021-05-12 DIAGNOSIS — M542 Cervicalgia: Secondary | ICD-10-CM | POA: Diagnosis not present

## 2021-05-12 DIAGNOSIS — R29898 Other symptoms and signs involving the musculoskeletal system: Secondary | ICD-10-CM | POA: Diagnosis not present

## 2021-05-12 DIAGNOSIS — R293 Abnormal posture: Secondary | ICD-10-CM | POA: Diagnosis not present

## 2021-05-12 DIAGNOSIS — R519 Headache, unspecified: Secondary | ICD-10-CM

## 2021-05-12 NOTE — Therapy (Addendum)
[facility] [place] [address] [place], [place], [address] Phone: [phone]   Fax:  [phone]  Physical Therapy Treatment and Discharge Summary  PHYSICAL THERAPY DISCHARGE SUMMARY  Visits from Start of Care: 3  Current functional level related to goals / functional outcomes: See progress note for discharge status    Remaining deficits: Unknown    Education / Equipment: HEP    Patient agrees to discharge. Patient goals were partially met. Patient is being discharged due to not returning since the last visit.  Jakera Beaupre P. Helene Kelp PT, MPH 06/10/21 12:56 PM  Patient Details  Name: Allen Fisher MRN: [id number] Date of Birth: 04-Mar-1976 Referring Provider (PT): Samuel Bouche, NP   Encounter Date: 05/12/2021   PT End of Session - 05/12/21 0804     Visit Number 3    Number of Visits 12    Date for PT Re-Evaluation 06/09/21    PT Start Time 0802    PT Stop Time 0845    PT Time Calculation (min) 43 min    Activity Tolerance Patient tolerated treatment well             Past Medical History:  Diagnosis Date   Environmental allergies    Frequent sinus infections    Pneumonia     Past Surgical History:  Procedure Laterality Date   BACK SURGERY     CHOLECYSTECTOMY     KNEE SURGERY     SHOULDER SURGERY      There were no vitals filed for this visit.   Subjective Assessment - 05/12/21 0804     Subjective Headache is better. Still has a knot in the neck. Doing some exercises at home and using the TENS unit.    Currently in Pain? No/denies    Pain Score 0-No pain    Pain Location Neck    Pain Orientation Right                [facility] PT Assessment - 05/12/21 0001       Assessment   Medical Diagnosis Cervicalgia; headaches    Referring Provider (PT) Samuel Bouche, NP    Onset Date/Surgical Date 01/13/21    Hand Dominance Right    Next MD Visit PRN    Prior Therapy chropractic care      AROM   Cervical  Flexion 64    Cervical Extension 59    Cervical - Right Side Bend 50    Cervical - Left Side Bend 46    Cervical - Right Rotation 70    Cervical - Left Rotation 65                           [facility] Adult PT Treatment/Exercise - 05/12/21 0001       Neck Exercises: Machines for Strengthening   UBE (Upper Arm Bike) L4 x 4 min alt fwd/back      Neck Exercises: Supine   Other Supine Exercise toracic release with coregeous bal ~ 2 min      Neck Exercises: Prone   W Back 10 reps   5 sec hold     Neck Exercises: Stretches   Chest Stretch Limitations doorway stretch 3 positions 30 sec hold x 2 reps each position - difficulty feeling stretch in pecs    Other Neck Stretches T at wall stretching to opposite side 30 sec x 2 reps each side      Shoulder Exercises: Stretch  Other Shoulder Stretches thoracic rotation in bent frw position      Manual Therapy   Manual therapy comments skilled palpation toassess response to DN and manual work    Joint Mobilization thoracic mobs PA and lateral mid thoracic spine    Soft tissue mobilization deep tissue work through the mid thoracic spine Rt > Lt    Myofascial Release thoracic spine              Trigger Point Dry Needling - 05/12/21 0001     Consent Given? Yes    Education Handout Provided Previously provided    Dry Needling Comments Rt    Electrical Stimulation Performed with Dry Needling Yes    Lower trapezius Response Palpable increased muscle length;Twitch response elicited                   PT Education - 05/12/21 0854     Education Details HEP    Person(s) Educated Patient    Methods Explanation;Demonstration;Tactile cues;Verbal cues;Handout    Comprehension Verbalized understanding;Returned demonstration;Verbal cues required;Tactile cues required                 PT Long Term Goals - 04/28/21 0916       PT LONG TERM GOAL #1   Title Patient reports full resolution of headaches and Rt  shoulder girdle pain    Time 6    Period Weeks    Status New    Target Date 06/09/21      PT LONG TERM GOAL #2   Title Improve posture and alignment with patient to demonstrate improved upright posture with posterior shoulder girdle musculature and core engaged    Time 6    Period Weeks    Status New    Target Date 06/09/21      PT LONG TERM GOAL #3   Title Full painfree cervical ROM/mobility    Time 6    Period Weeks    Status New    Target Date 06/09/21      PT LONG TERM GOAL #4   Title Independent in HEP (including aquatic program as indicated)    Time 6    Period Weeks    Status New    Target Date 06/09/21      PT LONG TERM GOAL #5   Title Improve functional limitation score to 73    Time 6    Period Weeks    Status New    Target Date 06/09/21                   Plan - 05/12/21 0805     Clinical Impression Statement Progressing well with good resolution of HA. Has some continued tightness in the cervical area. Good gains in cervical ROM/mobility. Using the TENs unit at home. Good response to DN and manual work. Progressing well toward stated goals of therapy.    Rehab Potential Good    PT Frequency 2x / week    PT Duration 6 weeks    PT Treatment/Interventions ADLs/Self Care Home Management;Aquatic Therapy;Cryotherapy;Electrical Stimulation;Iontophoresis 64m/ml Dexamethasone;Moist Heat;Ultrasound;Therapeutic activities;Therapeutic exercise;Neuromuscular re-education;Patient/family education;Manual techniques;Passive range of motion;Dry needling;Taping    PT Next Visit Plan review HEP; add axial extension, pec stretch, posterior shoulder girdle strengthening; assess response to DN and manual work; modalities as indicated (trial of TENS for home purchase)    PT Home Exercise Plan [email or web address]    Consulted and Agree with Plan of Care Patient  Patient will benefit from skilled therapeutic intervention in order to improve the following deficits and  impairments:     Visit Diagnosis: Cervicalgia  Frequent headaches  Other symptoms and signs involving the musculoskeletal system  Abnormal posture     Problem List Patient Active Problem List   Diagnosis Date Noted   Cervicogenic headache 04/22/2021   Loud snoring 04/22/2021   Status post cholecystectomy 12/13/2016   Seasonal allergic rhinitis 12/13/2016   Elevated blood pressure reading 12/13/2016   Onychomycosis 12/13/2016   Acne vulgaris 07/04/2014   Hypogonadism male 06/05/2014   Gilbert's syndrome 06/05/2014   IBS (irritable bowel syndrome) 06/05/2014   Class 1 obesity due to excess calories with serious comorbidity and body mass index (BMI) of 32.0 to 32.9 in adult 06/05/2014   Elevated LFTs 06/05/2014   Asthma 10/12/2010    Adilynne Fitzwater Nilda Simmer, PT,MPH  05/12/2021, 8:55 AM  [facility] [place], [place], [address] Phone: [phone]   Fax:  [phone]  Name: Adeel Guiffre MRN: [id number] Date of Birth: 1976-03-28

## 2021-05-12 NOTE — Patient Instructions (Signed)
Access Code: [email or web address] URL: [email or web address] Date: 05/12/2021 Prepared by: Corlis Leak  Exercises Doorway Pec Stretch at 120 Degrees Abduction - 2 x daily - 7 x weekly - 1 sets - 3 reps - 30 seconds hold Seated Scapular Retraction - 2 x daily - 7 x weekly - 1-2 sets - 10 reps - 10 sec hold First Rib Mobilization with Strap - 2 x daily - 7 x weekly - 1 sets - 3 reps - 30 seconds hold Prone Scapular Retraction Y - 2 x daily - 7 x weekly - 1 sets - 10 reps Supine Thoracic Mobilization Towel Roll Vertical with Arm Stretch - 2 x daily - 7 x weekly - 1 sets - 1 reps - 2 minutes hold Standing Pec Stretch at Wall - 2 x daily - 7 x weekly - 1 sets - 3 reps - 30 sec hold Seated Thoracic Lumbar Extension with Pectoralis Stretch - 2 x daily - 7 x weekly - 1 sets - 2-3 reps - 20 sec hold Prone W Scapular Retraction - 2 x daily - 7 x weekly - 1 sets - 5-10 reps - 3-5 sec hold

## 2021-05-17 ENCOUNTER — Encounter: Payer: 59 | Admitting: Rehabilitative and Restorative Service Providers"

## 2021-05-19 ENCOUNTER — Other Ambulatory Visit (HOSPITAL_COMMUNITY): Payer: Self-pay

## 2021-05-19 ENCOUNTER — Encounter: Payer: 59 | Admitting: Rehabilitative and Restorative Service Providers"

## 2021-05-19 MED ORDER — TESTOSTERONE CYPIONATE 200 MG/ML IM SOLN
INTRAMUSCULAR | 1 refills | Status: DC
  Filled 2021-05-19: qty 2, 28d supply, fill #0

## 2021-06-10 ENCOUNTER — Other Ambulatory Visit: Payer: Self-pay

## 2021-06-10 DIAGNOSIS — R0683 Snoring: Secondary | ICD-10-CM

## 2021-07-22 ENCOUNTER — Other Ambulatory Visit: Payer: Self-pay

## 2021-07-22 ENCOUNTER — Ambulatory Visit: Payer: Self-pay

## 2021-07-22 ENCOUNTER — Emergency Department
Admission: EM | Admit: 2021-07-22 | Discharge: 2021-07-22 | Disposition: A | Discharge status: Home / Self Care | Payer: 59 | Source: Home / Self Care | Attending: Family Medicine | Admitting: Family Medicine

## 2021-07-22 DIAGNOSIS — J9801 Acute bronchospasm: Secondary | ICD-10-CM

## 2021-07-22 DIAGNOSIS — J069 Acute upper respiratory infection, unspecified: Secondary | ICD-10-CM | POA: Diagnosis not present

## 2021-07-22 MED ORDER — CEFDINIR 300 MG PO CAPS: 300.0000 mg | ORAL_CAPSULE | Freq: Two times a day (BID) | ORAL | 0 refills | Status: DC

## 2021-07-22 MED ORDER — METHYLPREDNISOLONE SODIUM SUCC 125 MG IJ SOLR
80.0000 mg | Freq: Once | INTRAMUSCULAR | Status: AC
  Administered 2021-07-22: 80 mg via INTRAMUSCULAR

## 2021-07-22 MED ORDER — PREDNISONE 20 MG PO TABS: ORAL_TABLET | ORAL | 0 refills | Status: DC

## 2021-07-22 NOTE — Discharge Instructions (Addendum)
Begin prednisone Friday 07/22/21. Continue plain guaifenesin (1200mg  extended release tabs such as Mucinex) twice daily, with plenty of water, for cough and congestion.  May add Pseudoephedrine (30mg , one or two every 4 to 6 hours) for sinus congestion.  Get adequate rest.  May take Delsym Cough Suppressant ("12 Hour Cough Relief") at bedtime for nighttime cough.   May use Afrin nasal spray (or generic oxymetazoline) each morning for about 5 days and then discontinue.  Also recommend using saline nasal spray several times daily and saline nasal irrigation (AYR is a common brand).  Use Flonase nasal spray each morning after using Afrin nasal spray and saline nasal irrigation. Try warm salt water gargles for sore throat.  Stop all antihistamines for now, and other non-prescription cough/cold preparations.

## 2021-07-22 NOTE — ED Provider Notes (Signed)
[name]    CSN: [id number] Arrival date & time: 07/22/21  1454      History   Chief Complaint Chief Complaint  Patient presents with   Wheezing   Cough   Nasal Congestion    HPI Allen Fisher is a 45 y.o. male.   Patient developed URI symptoms about one week ago with initial scratchy throat and sinus congestion, followed by a cough.  During the past four days he has had persistent chills and a worsening cough with onset of wheezing last night.  He denies pleuritic pain  The history is provided by the patient.   Past Medical History:  Diagnosis Date   Environmental allergies    Frequent sinus infections    Pneumonia     Patient Active Problem List   Diagnosis Date Noted   Cervicogenic headache 04/22/2021   Loud snoring 04/22/2021   Status post cholecystectomy 12/13/2016   Seasonal allergic rhinitis 12/13/2016   Elevated blood pressure reading 12/13/2016   Onychomycosis 12/13/2016   Acne vulgaris 07/04/2014   Hypogonadism male 06/05/2014   Gilbert's syndrome 06/05/2014   IBS (irritable bowel syndrome) 06/05/2014   Class 1 obesity due to excess calories with serious comorbidity and body mass index (BMI) of 32.0 to 32.9 in adult 06/05/2014   Elevated LFTs 06/05/2014   Asthma 10/12/2010    Past Surgical History:  Procedure Laterality Date   BACK SURGERY     CHOLECYSTECTOMY     KNEE SURGERY     SHOULDER SURGERY         Home Medications    Prior to Admission medications   Medication Sig Start Date End Date Taking? Authorizing Provider  cefdinir (OMNICEF) 300 MG capsule Take 1 capsule (300 mg total) by mouth 2 (two) times daily. 07/22/21  Yes Lattie Haw, MD  predniSONE (DELTASONE) 20 MG tablet Take one tab by mouth twice daily for 4 days, then one daily for 3 days. Take with food. 07/22/21  Yes Lattie Haw, MD  baclofen (LIORESAL) 10 MG tablet Take 1 tablet (10 mg total) by mouth 3 (three) times daily as needed for muscle  spasms. Patient not taking: Reported on 07/22/2021 04/22/21   Christen Butter, NP  cetirizine (ZYRTEC) 10 MG tablet Take 10 mg by mouth daily. Patient not taking: Reported on 07/22/2021    [provider]  colesevelam (WELCHOL) 625 MG tablet TAKE 3 TABLETS BY MOUTH  TWICE DAILY WITH A MEAL 12/01/20   Christen Butter, NP  fexofenadine (ALLEGRA) 180 MG tablet Take 180 mg by mouth daily. Patient not taking: Reported on 07/22/2021    [provider]  fluticasone (FLONASE) 50 MCG/ACT nasal spray Place into both nostrils daily. Patient not taking: Reported on 07/22/2021    [provider]  meloxicam (MOBIC) 15 MG tablet Take 1 tablet by mouth daily. Start after completing prednisone Patient not taking: Reported on 07/22/2021 04/22/21   Christen Butter, NP  phentermine (ADIPEX-P) 37.5 MG tablet Take 1/2 tablet by mouth every morning. Patient not taking: Reported on 07/22/2021 04/22/21   Christen Butter, NP  tamsulosin (FLOMAX) 0.4 MG CAPS capsule Take 1 capsule (0.4 mg total) by mouth daily. Patient not taking: Reported on 07/22/2021 07/08/19   Lurene Shadow, PA-C  testosterone cypionate (DEPOTESTOSTERONE CYPIONATE) 200 MG/ML injection Inject 1 ML into the muscle every 2 weeks Patient not taking: Reported on 07/22/2021 03/18/21     testosterone cypionate (DEPOTESTOSTERONE CYPIONATE) 200 MG/ML injection Inject 1 ml into the muscle  every 2 weeks Patient not taking: Reported on 07/22/2021 05/19/21       Family History Family History  Problem Relation Age of Onset   Cancer Mother        uterine   Arrhythmia Mother    Healthy Father     Social History Social History   Tobacco Use   Smoking status: Never   Smokeless tobacco: Current    Types: Snuff  Vaping Use   Vaping Use: Never used  Substance Use Topics   Alcohol use: Yes    Alcohol/week: 0.0 standard drinks    Comment: occasionally   Drug use: No     Allergies   Patient has no known allergies.   Review of Systems Review of  Systems + sore throat + cough No pleuritic pain + wheezing + nasal congestion + post-nasal drainage No sinus pain/pressure No itchy/red eyes No earache No hemoptysis + SOB with activity. No fever, + chills No nausea No vomiting No abdominal pain No diarrhea No urinary symptoms No skin rash + fatigue No myalgias No headache Used OTC meds (Mucinex) without relief   Physical Exam Triage Vital Signs ED Triage Vitals  Enc Vitals Group     BP 07/22/21 1550 (!) 150/99     Pulse Rate 07/22/21 1550 74     Resp 07/22/21 1550 14     Temp 07/22/21 1550 98.8 F (37.1 C)     Temp Source 07/22/21 1550 Oral     SpO2 07/22/21 1550 98 %     Weight --      Height --      Head Circumference --      Peak Flow --      Pain Score 07/22/21 1551 0     Pain Loc --      Pain Edu? --      Excl. in GC? --    No data found.  Updated Vital Signs BP (!) 150/99 (BP Location: Left Arm)   Pulse 74   Temp 98.8 F (37.1 C) (Oral)   Resp 14   SpO2 98%   Visual Acuity Right Eye Distance:   Left Eye Distance:   Bilateral Distance:    Right Eye Near:   Left Eye Near:    Bilateral Near:     Physical Exam Nursing notes and Vital Signs reviewed. Appearance:  Patient appears stated age, and in no acute distress Eyes:  Pupils are equal, round, and reactive to light and accomodation.  Extraocular movement is intact.  Conjunctivae are not inflamed  Ears:  Canals normal.  Tympanic membranes normal.  Nose:  Mildly congested turbinates.  No sinus tenderness.   Pharynx:  Normal Neck:  Supple.  Mildly enlarged lateral nodes are present, tender to palpation on the left.   Lungs:  Diffuse bilateral rhonchi without rales in all fields.  Breath sounds are equal.  Moving air well. Heart:  Regular rate and rhythm without murmurs, rubs, or gallops.  Abdomen:  Nontender without masses or hepatosplenomegaly.  Bowel sounds are present.  No CVA or flank tenderness.  Extremities:  No edema.  Skin:  No rash  present.   UC Treatments / Results  Labs (all labs ordered are listed, but only abnormal results are displayed) Labs Reviewed - No data to display  EKG   Radiology No results found.  Procedures Procedures (including critical care time)  Medications Ordered in UC Medications  methylPREDNISolone sodium succinate (SOLU-MEDROL) 125 mg/2 mL injection 80 mg (80 mg  Intramuscular Given 07/22/21 1749)    Initial Impression / Assessment and Plan / UC Course  I have reviewed the triage vital signs and the nursing notes.  Pertinent labs & imaging results that were available during my care of the patient were reviewed by me and considered in my medical decision making (see chart for details).   Suspect developing bronchitis.  Administered Solumedrol 80mg  IM Begin Omnicef and prednisone burst/taper. Followup with Family Doctor if not improved in one week.    Final Clinical Impressions(s) / UC Diagnoses   Final diagnoses:  Viral URI with cough  Acute bronchospasm     Discharge Instructions      Begin prednisone Friday 07/22/21. Continue plain guaifenesin (1200mg  extended release tabs such as Mucinex) twice daily, with plenty of water, for cough and congestion.  May add Pseudoephedrine (30mg , one or two every 4 to 6 hours) for sinus congestion.  Get adequate rest.  May take Delsym Cough Suppressant ("12 Hour Cough Relief") at bedtime for nighttime cough.   May use Afrin nasal spray (or generic oxymetazoline) each morning for about 5 days and then discontinue.  Also recommend using saline nasal spray several times daily and saline nasal irrigation (AYR is a common brand).  Use Flonase nasal spray each morning after using Afrin nasal spray and saline nasal irrigation. Try warm salt water gargles for sore throat.  Stop all antihistamines for now, and other non-prescription cough/cold preparations.     ED Prescriptions     Medication Sig Dispense Auth. Provider   predniSONE  (DELTASONE) 20 MG tablet Take one tab by mouth twice daily for 4 days, then one daily for 3 days. Take with food. 11 tablet 14/8/22, MD   cefdinir (OMNICEF) 300 MG capsule Take 1 capsule (300 mg total) by mouth 2 (two) times daily. 20 capsule , MD         , MD 07/23/21 [phone]

## 2021-07-22 NOTE — ED Triage Notes (Signed)
Pt presents with cough, congestion, and wheezing x 1 week

## 2021-07-30 ENCOUNTER — Ambulatory Visit (HOSPITAL_BASED_OUTPATIENT_CLINIC_OR_DEPARTMENT_OTHER): Payer: 59 | Attending: Medical-Surgical | Admitting: Internal Medicine

## 2021-07-30 DIAGNOSIS — G4733 Obstructive sleep apnea (adult) (pediatric): Secondary | ICD-10-CM | POA: Insufficient documentation

## 2021-07-30 DIAGNOSIS — R0683 Snoring: Secondary | ICD-10-CM | POA: Diagnosis present

## 2021-08-06 DIAGNOSIS — R0683 Snoring: Secondary | ICD-10-CM

## 2021-08-06 NOTE — Procedures (Signed)
° ° °  Patient Name: Allen Fisher, Allen Fisher Date: 08/01/2021 Gender: Male D.O.B: Jan 04, 1976 Age (years): 89 Referring Provider: Christen Butter NP Height (inches): 66 Interpreting Physician: Jetty Duhamel MD, ABSM Weight (lbs): 185 RPSGT: [place] BMI: 30 MRN: [id number] Neck Size: 18.00  CLINICAL INFORMATION Sleep Study Type: HST Indication for sleep study: OSA Epworth Sleepiness Score: 5  SLEEP STUDY TECHNIQUE A multi-channel overnight portable sleep study was performed. The channels recorded were: nasal airflow, thoracic respiratory movement, and oxygen saturation with a pulse oximetry. Snoring was also monitored.  MEDICATIONS Patient self administered medications include: none reported.  SLEEP ARCHITECTURE Patient was studied for 379.9 minutes. The sleep efficiency was 100.0 % and the patient was supine for 32.1%. The arousal index was 0.0 per hour.  RESPIRATORY PARAMETERS The overall AHI was 15.2 per hour, with a central apnea index of 0 per hour. The oxygen nadir was 83% during sleep.  CARDIAC DATA Mean heart rate during sleep was 64.4 bpm.  IMPRESSIONS - Moderate obstructive sleep apnea occurred during this study (AHI = 15.2/h). - Moderate oxygen desaturation was noted during this study (Min O2 = 83%). - Patient snored.  DIAGNOSIS - Obstructive Sleep Apnea (G47.33)  RECOMMENDATIONS - Suggest CPAP titration sleep study or autopap. Other options would be based on clinical judgment. - Be careful with alcohol, sedatives and other CNS depressants that may worsen sleep apnea and disrupt normal sleep architecture. - Sleep hygiene should be reviewed to assess factors that may improve sleep quality. - Weight management and regular exercise should be initiated or continued.  [Electronically signed] 08/06/2021 04:29 PM  Jetty Duhamel MD, ABSM Diplomate, American Board of Sleep Medicine   NPI: [id number]                         Jetty Duhamel Diplomate, American Board of Sleep Medicine  ELECTRONICALLY SIGNED ON:  08/06/2021, 4:27 PM [facility] PH: [phone]   FX: [phone] ACCREDITED BY THE AMERICAN ACADEMY OF SLEEP MEDICINE

## 2021-11-17 ENCOUNTER — Encounter: Payer: Self-pay | Admitting: Medical-Surgical

## 2021-11-17 ENCOUNTER — Ambulatory Visit: Payer: 59 | Admitting: Medical-Surgical

## 2021-11-17 ENCOUNTER — Other Ambulatory Visit (HOSPITAL_COMMUNITY): Payer: Self-pay

## 2021-11-17 VITALS — BP 164/115 | HR 72 | Resp 20 | Ht 66.0 in | Wt 203.6 lb

## 2021-11-17 DIAGNOSIS — Z23 Encounter for immunization: Secondary | ICD-10-CM | POA: Diagnosis not present

## 2021-11-17 DIAGNOSIS — R635 Abnormal weight gain: Secondary | ICD-10-CM

## 2021-11-17 DIAGNOSIS — G4733 Obstructive sleep apnea (adult) (pediatric): Secondary | ICD-10-CM

## 2021-11-17 DIAGNOSIS — I10 Essential (primary) hypertension: Secondary | ICD-10-CM

## 2021-11-17 DIAGNOSIS — Z0000 Encounter for general adult medical examination without abnormal findings: Secondary | ICD-10-CM | POA: Diagnosis not present

## 2021-11-17 DIAGNOSIS — R7303 Prediabetes: Secondary | ICD-10-CM

## 2021-11-17 MED ORDER — VALSARTAN 40 MG PO TABS
40.0000 mg | ORAL_TABLET | Freq: Every day | ORAL | 0 refills | Status: DC
  Filled 2021-11-17: qty 30, 30d supply, fill #0

## 2021-11-17 MED ORDER — AMBULATORY NON FORMULARY MEDICATION: 0 refills | Status: DC

## 2021-11-17 NOTE — Progress Notes (Signed)
HPI: ?Allen Fisher is a 46 y.o. male who  has a past medical history of Environmental allergies, Frequent sinus infections, and Pneumonia. ? ?he presents to [facility] today, 11/17/21,  ?for chief complaint of: ?Annual physical exam ? ?Dentist: UTD ?Eye exam: About 2 years ago, no vision concerns or correction needed ?Exercise: Doing cardio, body weight exercises, dog walking, and stretching ?Diet: Has cut back on fast foods, more home cooking, does not add salt to cooked food. ? ?Concerns: ? ?Sleep study showing moderate sleep apnea.  Is interested in trying a CPAP since he does admit to sleeping terribly. ? ?Blood pressure significantly elevated again today.  This makes several readings where it was elevated.  He has checked it a couple of times at home and notes it was high there. ? ?Past medical, surgical, social and family history reviewed: ? ?Patient Active Problem List  ? Diagnosis Date Noted  ? Cervicogenic headache 04/22/2021  ? Loud snoring 04/22/2021  ? Status post cholecystectomy 12/13/2016  ? Seasonal allergic rhinitis 12/13/2016  ? Elevated blood pressure reading 12/13/2016  ? Onychomycosis 12/13/2016  ? Acne vulgaris 07/04/2014  ? Hypogonadism male 06/05/2014  ? Gilbert's syndrome 06/05/2014  ? IBS (irritable bowel syndrome) 06/05/2014  ? Class 1 obesity due to excess calories with serious comorbidity and body mass index (BMI) of 32.0 to 32.9 in adult 06/05/2014  ? Elevated LFTs 06/05/2014  ? Asthma 10/12/2010  ? ? ?Past Surgical History:  ?Procedure Laterality Date  ? BACK SURGERY    ? CHOLECYSTECTOMY    ? KNEE SURGERY    ? SHOULDER SURGERY    ? ? ?Social History  ? ?Tobacco Use  ? Smoking status: Never  ? Smokeless tobacco: Current  ?  Types: Snuff  ?Substance Use Topics  ? Alcohol use: Yes  ?  Alcohol/week: 0.0 standard drinks  ?  Comment: occasionally  ? ? ?Family History  ?Problem Relation Age of Onset  ? Cancer Mother   ?     uterine  ? Arrhythmia Mother   ?  Healthy Father   ?  ? ?Current medication list and allergy/intolerance information reviewed:   ? ?Current Outpatient Medications  ?Medication Sig Dispense Refill  ? AMBULATORY NON FORMULARY MEDICATION Continuous positive airway pressure (CPAP) machine set on AutoPAP (4-20 cmH2O), with all supplemental supplies as needed. 1 each 0  ? cefdinir (OMNICEF) 300 MG capsule Take 1 capsule (300 mg total) by mouth 2 (two) times daily. 20 capsule 0  ? colesevelam (WELCHOL) 625 MG tablet TAKE 3 TABLETS BY MOUTH  TWICE DAILY WITH A MEAL 540 tablet 3  ? predniSONE (DELTASONE) 20 MG tablet Take one tab by mouth twice daily for 4 days, then one daily for 3 days. Take with food. 11 tablet 0  ? valsartan (DIOVAN) 40 MG tablet Take 1 tablet by mouth daily. 30 tablet 0  ? ?No current facility-administered medications for this visit.  ? ? ?No Known Allergies ?  ? ?Review of Systems: ?Constitutional:  No  fever, no chills, No recent illness, No unintentional weight changes. No significant fatigue.  ?HEENT: No  headache, no vision change, no hearing change, No sore throat, No  sinus pressure ?Cardiac: No  chest pain, No  pressure, No palpitations, No  Orthopnea ?Respiratory:  No  shortness of breath. No  Cough ?Gastrointestinal: No  abdominal pain, No  nausea, No  vomiting,  No  blood in stool, No  diarrhea, No  constipation  ?Musculoskeletal:  No new myalgia/arthralgia ?Skin: No  Rash, No other wounds/concerning lesions ?Genitourinary: No  incontinence, No  abnormal genital bleeding, No abnormal genital discharge ?Hem/Onc: No  easy bruising/bleeding, No  abnormal lymph node ?Endocrine: No cold intolerance,  No heat intolerance. No polyuria/polydipsia/polyphagia  ?Neurologic: No  weakness, No  dizziness, No  slurred speech/focal weakness/facial droop ?Psychiatric: No  concerns with depression, No  concerns with anxiety, + sleep problems, No mood problems ? ?Exam:  ?BP (!) 164/115   Pulse 72   Resp 20   Ht 5\' 6"  (1.676 m)   Wt 203 lb  9.6 oz (92.4 kg)   SpO2 97%   BMI 32.86 kg/m?  ?Constitutional: VS see above. General Appearance: alert, well-developed, well-nourished, NAD ?Eyes: Normal lids and conjunctive, non-icteric sclera ?Ears, Nose, Mouth, Throat: MMM, Normal external inspection ears/nares/mouth/lips/gums. TM normal bilaterally. Pharynx/tonsils no erythema, no exudate. Nasal mucosa normal.  ?Neck: No masses, trachea midline. No thyroid enlargement. No tenderness/mass appreciated. No lymphadenopathy ?Respiratory: Normal respiratory effort. no wheeze, no rhonchi, no rales ?Cardiovascular: S1/S2 normal, no murmur, no rub/gallop auscultated. RRR. No lower extremity edema. Pedal pulse II/IV bilaterally PT. No carotid bruit or JVD. No abdominal aortic bruit. ?Gastrointestinal: Nontender, no masses. No hepatomegaly, no splenomegaly. No hernia appreciated. Bowel sounds normal. Rectal exam deferred.  ?Musculoskeletal: Gait normal. No clubbing/cyanosis of digits.  ?Neurological: Normal balance/coordination. No tremor. No cranial nerve deficit on limited exam. Motor and sensation intact and symmetric. Cerebellar reflexes intact.  ?Skin: warm, dry, intact. No rash/ulcer. No concerning nevi or subq nodules on limited exam.   ?Psychiatric: Normal judgment/insight. Normal mood and affect. Oriented x3.  ? ?ASSESSMENT/PLAN:  ? ?1. Annual physical exam ?Checking labs.  Up-to-date on preventative care.  Wellness information provided with AVS. ? ?2. Essential hypertension ?Checking labs as below.  Starting valsartan 40 mg daily.  Recommend obtaining a blood pressure cuff that measures on the arm and monitoring blood pressure at home with goal of 130/80 or less.  DASH diet information printed and provided with AVS.  Discussed weight loss options.  Recommend continuing regular intentional exercise. ?- CBC with Differential/Platelet ?- COMPLETE METABOLIC PANEL WITH GFR ?- Lipid panel ?- TSH ?- valsartan (DIOVAN) 40 MG tablet; Take 1 tablet by mouth daily.   Dispense: 30 tablet; Refill: 0 ? ?3. Prediabetes ?Had a test at work that showed prediabetes but would like this verified today.  Checking A1c. ?- Hemoglobin A1c ? ?4. Moderate obstructive sleep apnea ?CPAP prescription ordered, printed, and faxed along with sleep study and office note.  Likely contributing to hypertension and poor sleep quality. ? ?5. Need for tetanus booster ?Tdap given in office today. ?- Tdap vaccine greater than or equal to 7yo IM ? ?6.  Weight gain ?Discussed various options to help with weight loss.  He is very interested and possible weekly injections however we are unsure if his insurance will cover it.  Discussed various other options for weight loss.  With his uncontrolled hypertension, phentermine is contraindicated.  Discussed possible use for Wellbutrin versus Topamax versus metformin.  Metformin may be a good option for him if he is prediabetic however he does already have some underlying GI issues that may be a factor.  Plan made to evaluate lab work once it is resulted and then decide on the best agent.  Patient verbalized understanding and is agreeable with the plan. ? ? ?Orders Placed This Encounter  ?Procedures  ? Tdap vaccine greater than or equal to 7yo IM  ? CBC with  Differential/Platelet  ? COMPLETE METABOLIC PANEL WITH GFR  ? Lipid panel  ? TSH  ? Hemoglobin A1c  ? ? ?Meds ordered this encounter  ?Medications  ? valsartan (DIOVAN) 40 MG tablet  ?  Sig: Take 1 tablet by mouth daily.  ?  Dispense:  30 tablet  ?  Refill:  0  ?  Order Specific Question:   Supervising Provider  ?  Answer:   MATTHEWS, CODY [[id number]]  ? AMBULATORY NON FORMULARY MEDICATION  ?  Sig: Continuous positive airway pressure (CPAP) machine set on AutoPAP (4-20 cmH2O), with all supplemental supplies as needed.  ?  Dispense:  1 each  ?  Refill:  0  ?  Fax to [facility]: [phone]  ?  Order Specific Question:   Supervising Provider  ?  Answer:   MATTHEWS, CODY [[id number]]  ? ? ?There are no  Patient Instructions on file for this visit. ? ?Follow-up plan: Return in about 2 weeks (around 12/01/2021) for nurse visit for BP check. ? ?Clearnce Sorrel, DNP, APRN, FNP-BC ?[place] [facility]

## 2021-11-18 ENCOUNTER — Encounter: Payer: Self-pay | Admitting: Medical-Surgical

## 2021-11-18 ENCOUNTER — Other Ambulatory Visit (HOSPITAL_COMMUNITY): Payer: Self-pay

## 2021-11-18 ENCOUNTER — Other Ambulatory Visit: Payer: Self-pay | Admitting: Medical-Surgical

## 2021-11-18 ENCOUNTER — Telehealth: Payer: Self-pay

## 2021-11-18 LAB — COMPLETE METABOLIC PANEL WITH GFR
AG Ratio: 2 (calc) (ref 1.0–2.5)
ALT: 32 U/L (ref 9–46)
AST: 20 U/L (ref 10–40)
Albumin: 4.5 g/dL (ref 3.6–5.1)
Alkaline phosphatase (APISO): 45 U/L (ref 36–130)
BUN: 11 mg/dL (ref 7–25)
CO2: 29 mmol/L (ref 20–32)
Calcium: 9.4 mg/dL (ref 8.6–10.3)
Chloride: 106 mmol/L (ref 98–110)
Creat: 0.88 mg/dL (ref 0.60–1.29)
Globulin: 2.3 g/dL (calc) (ref 1.9–3.7)
Glucose, Bld: 86 mg/dL (ref 65–99)
Potassium: 5.1 mmol/L (ref 3.5–5.3)
Sodium: 140 mmol/L (ref 135–146)
Total Bilirubin: 1.8 mg/dL — ABNORMAL HIGH (ref 0.2–1.2)
Total Protein: 6.8 g/dL (ref 6.1–8.1)
eGFR: 109 mL/min/{1.73_m2} (ref >=60)

## 2021-11-18 LAB — HEMOGLOBIN A1C
Hgb A1c MFr Bld: 5 % of total Hgb (ref <5.7)
Mean Plasma Glucose: 97 mg/dL
eAG (mmol/L): 5.4 mmol/L

## 2021-11-18 LAB — CBC WITH DIFFERENTIAL/PLATELET
Absolute Monocytes: 432 cells/uL (ref 200–950)
Basophils Absolute: 38 cells/uL (ref 0–200)
Basophils Relative: 0.8 %
Eosinophils Absolute: 302 cells/uL (ref 15–500)
Eosinophils Relative: 6.3 %
HCT: 50.5 % — ABNORMAL HIGH (ref 38.5–50.0)
Hemoglobin: 16.9 g/dL (ref 13.2–17.1)
Lymphs Abs: 1454 cells/uL (ref 850–3900)
MCH: 29.7 pg (ref 27.0–33.0)
MCHC: 33.5 g/dL (ref 32.0–36.0)
MCV: 88.8 fL (ref 80.0–100.0)
MPV: 12.9 fL — ABNORMAL HIGH (ref 7.5–12.5)
Monocytes Relative: 9 %
Neutro Abs: 2573 cells/uL (ref 1500–7800)
Neutrophils Relative %: 53.6 %
Platelets: 177 10*3/uL (ref 140–400)
RBC: 5.69 10*6/uL (ref 4.20–5.80)
RDW: 12.7 % (ref 11.0–15.0)
Total Lymphocyte: 30.3 %
WBC: 4.8 10*3/uL (ref 3.8–10.8)

## 2021-11-18 LAB — TSH: TSH: 1.97 mIU/L (ref 0.40–4.50)

## 2021-11-18 LAB — LIPID PANEL
Cholesterol: 148 mg/dL (ref <200)
HDL: 41 mg/dL (ref >=40)
LDL Cholesterol (Calc): 87 mg/dL (calc)
Non-HDL Cholesterol (Calc): 107 mg/dL (calc) (ref <130)
Total CHOL/HDL Ratio: 3.6 (calc) (ref <5.0)
Triglycerides: 104 mg/dL (ref <150)

## 2021-11-18 MED ORDER — WEGOVY 0.25 MG/0.5ML ~~LOC~~ SOAJ: 0.2500 mg | SUBCUTANEOUS | 0 refills | Status: DC

## 2021-11-18 MED ORDER — WEGOVY 0.25 MG/0.5ML ~~LOC~~ SOAJ
0.2500 mg | SUBCUTANEOUS | 0 refills | Status: DC
  Filled 2021-11-18: qty 2, 28d supply, fill #0

## 2021-11-18 NOTE — Telephone Encounter (Signed)
Initiated Prior authorization ZOX:WRUEAV 0.25MG /0.5ML auto-injectors ?Via: Covermymeds ?Case/Key: [id number] ?Status: denied as of 11/18/21 ?Reason:This request was denied because you did not meet the following clinical requirements: ?The requested medication and/or diagnosis are not a covered benefit and excluded from coverage in ?accordance with the terms and conditions of your plan benefit. Therefore, the request has been ?administratively denied. ?The requested medication and/or diagnosis are not a covered benefit and are excluded from coverage ?in accordance with the terms and conditions of your plan benefit. Therefore, this request has been ?administratively denied. ?Notified Pt via: Mychart ?

## 2021-11-20 ENCOUNTER — Other Ambulatory Visit (HOSPITAL_COMMUNITY): Payer: Self-pay

## 2021-11-22 ENCOUNTER — Telehealth: Payer: Self-pay

## 2021-11-22 NOTE — Telephone Encounter (Addendum)
Initiated Appeal [id number]:[id number] Management (WEGOVY) 0.25 MG/0.5ML SOAJ ?Via: Covermymeds ?Case/Key:faxed information to [employer] rx appeals dept  ?Status: denied  as of 11/22/21 ?Reason:The requested medication and/or diagnosis are not a covered benefit and  are excluded from coverage in accordance with the terms and conditions of your plan benefit. Therefore, the request has been administratively denied. ?Notified Pt via: Mychart ?

## 2021-12-01 ENCOUNTER — Other Ambulatory Visit (HOSPITAL_COMMUNITY): Payer: Self-pay

## 2021-12-01 ENCOUNTER — Ambulatory Visit (INDEPENDENT_AMBULATORY_CARE_PROVIDER_SITE_OTHER): Payer: 59 | Admitting: Medical-Surgical

## 2021-12-01 VITALS — BP 159/89 | HR 82

## 2021-12-01 DIAGNOSIS — I10 Essential (primary) hypertension: Secondary | ICD-10-CM

## 2021-12-01 MED ORDER — VALSARTAN 80 MG PO TABS
80.0000 mg | ORAL_TABLET | Freq: Every day | ORAL | 1 refills | Status: DC
  Filled 2021-12-01: qty 30, 30d supply, fill #0

## 2021-12-01 NOTE — Progress Notes (Signed)
? ?  Established Patient Office Visit ? ?Subjective   ?Patient ID: Allen Fisher, male    DOB: May 16, 1976  Age: 46 y.o. MRN: [id number] ? ?Chief Complaint  ?Patient presents with  ? Hypertension  ? ? ?HPI ? ?Allen Fisher is here for blood pressure check. Denies chest pain, shortness of breath or dizziness.  ? ?ROS ? ?  ?Objective:  ?  ? ?BP (!) 159/89   Pulse 82   SpO2 99%  ? ? ?Physical Exam ? ? ?Fisher results found for any visits on 12/01/21. ? ? ? ?The 10-year ASCVD risk score (Arnett DK, et al., 2019) is: 2.4% ? ?  ?Assessment & Plan:  ?Hypertension - Per Joy, increase the Valsartan to 80 mg daily. He will need the prescription to go to [facility]. Patient advised to follow up in 2 weeks for a nurse visit blood pressure check.  ? ?Problem List Items Addressed This Visit   ?None ?Visit Diagnoses   ? ? Essential hypertension    -  Primary  ? ?  ? ? ?Return in about 2 weeks (around 12/15/2021) for nurse visit blood pressure check..  ? ? ?Esmond Harps, CMA ? ?

## 2021-12-01 NOTE — Progress Notes (Signed)
Agree with documentation as below.  Increasing valsartan to 80 mg daily.  Okay to take 2 of the 40 mg tablets to equal 80 mg total.  New supply of 80 mg tablets sent to the pharmacy.  Please follow-up for a nurse visit in 2 weeks.  Monitor blood pressures at home with 130/80 or less as the goal. ? ?___________________________________________ ?Clearnce Sorrel, DNP, APRN, FNP-BC ?Primary Care and Sports Medicine ?[place] ? ?

## 2021-12-02 ENCOUNTER — Other Ambulatory Visit (HOSPITAL_COMMUNITY): Payer: Self-pay

## 2021-12-02 ENCOUNTER — Emergency Department
Admission: RE | Admit: 2021-12-02 | Discharge: 2021-12-02 | Disposition: A | Discharge status: Home / Self Care | Payer: 59 | Source: Ambulatory Visit | Attending: Family Medicine | Admitting: Family Medicine

## 2021-12-02 VITALS — BP 138/95 | HR 94 | Temp 98.0°F | Resp 18

## 2021-12-02 DIAGNOSIS — Z9109 Other allergy status, other than to drugs and biological substances: Secondary | ICD-10-CM | POA: Diagnosis not present

## 2021-12-02 MED ORDER — METHYLPREDNISOLONE SODIUM SUCC 125 MG IJ SOLR
80.0000 mg | Freq: Once | INTRAMUSCULAR | Status: AC
  Administered 2021-12-02: 80 mg via INTRAMUSCULAR

## 2021-12-02 MED ORDER — PREDNISONE 20 MG PO TABS: ORAL_TABLET | ORAL | 0 refills | Status: DC

## 2021-12-02 NOTE — ED Triage Notes (Signed)
Pt states he has been using zyrtec allegra and flonase for seasonal allergies but over the last week they have gotten worse and he is coughing. States he usually needs steroids around this time every year.  ?

## 2021-12-02 NOTE — Discharge Instructions (Signed)
I have given you a 10-day supply of prednisone.  Start tomorrow.  You may stop early if you feel better.  You can keep the extra prednisone for as needed use ?

## 2021-12-02 NOTE — ED Provider Notes (Signed)
?[facility] ? ? ? ?CSN: [id number] ?Arrival date & time: 12/02/21  1206 ? ? ?  ? ?History   ?Chief Complaint ?Chief Complaint  ?Patient presents with  ? Cough  ? ? ?HPI ?Allen Fisher is a 46 y.o. male.  ? ?HPI ? ?See triage note ?Patient complains of overwhelming allergy symptoms in spite of using his antihistamines and 2 nasal sprays.  He states that in years past he has been given steroid medications when his symptoms are difficult to control.  He states this works well for him ?He has no fever no other signs of infection ?He states the postnasal drip is starting to make him cough ?He has face pressure and pain, nasal congestion ? ?Past Medical History:  ?Diagnosis Date  ? Environmental allergies   ? Frequent sinus infections   ? Pneumonia   ? ? ?Patient Active Problem List  ? Diagnosis Date Noted  ? Cervicogenic headache 04/22/2021  ? Loud snoring 04/22/2021  ? Status post cholecystectomy 12/13/2016  ? Seasonal allergic rhinitis 12/13/2016  ? Elevated blood pressure reading 12/13/2016  ? Onychomycosis 12/13/2016  ? Acne vulgaris 07/04/2014  ? Hypogonadism male 06/05/2014  ? Gilbert's syndrome 06/05/2014  ? IBS (irritable bowel syndrome) 06/05/2014  ? Class 1 obesity due to excess calories with serious comorbidity and body mass index (BMI) of 32.0 to 32.9 in adult 06/05/2014  ? Elevated LFTs 06/05/2014  ? Asthma 10/12/2010  ? ? ?Past Surgical History:  ?Procedure Laterality Date  ? BACK SURGERY    ? CHOLECYSTECTOMY    ? KNEE SURGERY    ? SHOULDER SURGERY    ? ? ? ? ? ?Home Medications   ? ?Prior to Admission medications   ?Medication Sig Start Date End Date Taking? Authorizing Provider  ?predniSONE (DELTASONE) 20 MG tablet Take 2 tablets once a day for 5 days.  After this take 1 tablet a day until gone.  Take with food 12/02/21  Yes Raylene Everts, MD  ?testosterone cypionate (DEPOTESTOSTERONE CYPIONATE) 200 MG/ML injection Inject into the muscle every 14 (fourteen) days.   Yes [provider]  ?AMBULATORY NON FORMULARY MEDICATION Continuous positive airway pressure (CPAP) machine set on AutoPAP (4-20 cmH2O), with all supplemental supplies as needed. 11/17/21   Samuel Bouche, NP  ?colesevelam (WELCHOL) 625 MG tablet TAKE 3 TABLETS BY MOUTH  TWICE DAILY WITH A MEAL 12/01/20   Samuel Bouche, NP  ?Semaglutide-Weight Management (WEGOVY) 0.25 MG/0.5ML SOAJ Inject 1 pen (0.25 mg) into the skin once a week. 11/18/21   Samuel Bouche, NP  ?valsartan (DIOVAN) 80 MG tablet Take 1 tablet (80 mg total) by mouth daily. 12/01/21   Samuel Bouche, NP  ? ? ?Family History ?Family History  ?Problem Relation Age of Onset  ? Cancer Mother   ?     uterine  ? Arrhythmia Mother   ? Healthy Father   ? ? ?Social History ?Social History  ? ?Tobacco Use  ? Smoking status: Never  ? Smokeless tobacco: Current  ?  Types: Snuff  ?Vaping Use  ? Vaping Use: Never used  ?Substance Use Topics  ? Alcohol use: Yes  ?  Alcohol/week: 0.0 standard drinks  ?  Comment: occasionally  ? Drug use: No  ? ? ? ?Allergies   ?Patient has no known allergies. ? ? ?Review of Systems ?Review of Systems ?See HPI ? ?Physical Exam ?Triage Vital Signs ?ED Triage Vitals  ?Enc Vitals Group  ?   BP 12/02/21 1216 (!) 138/95  ?  Pulse Rate 12/02/21 1216 94  ?   Resp 12/02/21 1216 18  ?   Temp 12/02/21 1216 98 ?F (36.7 ?C)  ?   Temp Source 12/02/21 1216 Oral  ?   SpO2 12/02/21 1216 97 %  ?   Weight --   ?   Height --   ?   Head Circumference --   ?   Peak Flow --   ?   Pain Score 12/02/21 1219 0  ?   Pain Loc --   ?   Pain Edu? --   ?   Excl. in [place]? --   ? ?No data found. ? ?Updated Vital Signs ?BP (!) 138/95 (BP Location: Right Arm) Comment: just started BP meds  Pulse 94   Temp 98 ?F (36.7 ?C) (Oral)   Resp 18   SpO2 97%  ?   ? ?Physical Exam ?Constitutional:   ?   General: He is not in acute distress. ?   Appearance: He is well-developed.  ?HENT:  ?   Head: Normocephalic and atraumatic.  ?   Right Ear: Tympanic membrane and ear canal normal.  ?   Left Ear: Tympanic  membrane and ear canal normal.  ?   Nose: Congestion present. No rhinorrhea.  ?   Mouth/Throat:  ?   Pharynx: Posterior oropharyngeal erythema present.  ?Eyes:  ?   Conjunctiva/sclera: Conjunctivae normal.  ?   Pupils: Pupils are equal, round, and reactive to light.  ?Cardiovascular:  ?   Rate and Rhythm: Normal rate and regular rhythm.  ?   Heart sounds: Normal heart sounds.  ?Pulmonary:  ?   Effort: Pulmonary effort is normal. No respiratory distress.  ?   Breath sounds: Rhonchi present.  ?Abdominal:  ?   General: There is no distension.  ?   Palpations: Abdomen is soft.  ?Musculoskeletal:     ?   General: Normal range of motion.  ?   Cervical back: Normal range of motion.  ?Skin: ?   General: Skin is warm and dry.  ?Neurological:  ?   Mental Status: He is alert.  ?Psychiatric:     ?   Mood and Affect: Mood normal.     ?   Behavior: Behavior normal.  ? ? ? ?UC Treatments / Results  ?Labs ?(all labs ordered are listed, but only abnormal results are displayed) ?Labs Reviewed - No data to display ? ?EKG ? ? ?Radiology ?No results found. ? ?Procedures ?Procedures (including critical care time) ? ?Medications Ordered in UC ?Medications  ?methylPREDNISolone sodium succinate (SOLU-MEDROL) 125 mg/2 mL injection 80 mg (has no administration in time range)  ? ? ?Initial Impression / Assessment and Plan / UC Course  ?I have reviewed the triage vital signs and the nursing notes. ? ?Pertinent labs & imaging results that were available during my care of the patient were reviewed by me and considered in my medical decision making (see chart for details). ? ?  ? ?Final Clinical Impressions(s) / UC Diagnoses  ? ?Final diagnoses:  ?Environmental allergies  ? ? ? ?Discharge Instructions   ? ?  ?I have given you a 10-day supply of prednisone.  Start tomorrow.  You may stop early if you feel better.  You can keep the extra prednisone for as needed use ? ? ?ED Prescriptions   ? ? Medication Sig Dispense Auth. Provider  ? predniSONE  (DELTASONE) 20 MG tablet Take 2 tablets once a day for 5 days.  After this  take 1 tablet a day until gone.  Take with food 15 tablet Raylene Everts, MD  ? ?  ? ?PDMP not reviewed this encounter. ?  ?Raylene Everts, MD ?12/02/21 1257 ? ?

## 2021-12-03 ENCOUNTER — Other Ambulatory Visit (HOSPITAL_COMMUNITY): Payer: Self-pay

## 2021-12-03 ENCOUNTER — Other Ambulatory Visit: Payer: Self-pay | Admitting: Medical-Surgical

## 2021-12-03 MED ORDER — TESTOSTERONE CYPIONATE 200 MG/ML IM SOLN
INTRAMUSCULAR | 3 refills | Status: DC
  Filled 2021-12-03: qty 2, 28d supply, fill #0
  Filled 2022-01-18: qty 2, 28d supply, fill #1
  Filled 2022-02-13: qty 2, 28d supply, fill #2
  Filled 2022-03-17: qty 2, 28d supply, fill #3
  Filled 2022-04-20: qty 2, 28d supply, fill #4
  Filled 2022-05-23: qty 2, 28d supply, fill #5

## 2021-12-04 ENCOUNTER — Other Ambulatory Visit (HOSPITAL_COMMUNITY): Payer: Self-pay

## 2021-12-06 ENCOUNTER — Other Ambulatory Visit (HOSPITAL_COMMUNITY): Payer: Self-pay

## 2021-12-06 MED ORDER — TOPIRAMATE 25 MG PO TABS
ORAL_TABLET | ORAL | 0 refills | Status: DC
  Filled 2021-12-06: qty 85, 30d supply, fill #0

## 2021-12-06 MED ORDER — TOPIRAMATE 25 MG PO TABS: ORAL_TABLET | ORAL | 0 refills | Status: DC

## 2021-12-08 ENCOUNTER — Other Ambulatory Visit (HOSPITAL_COMMUNITY): Payer: Self-pay

## 2021-12-15 ENCOUNTER — Ambulatory Visit (INDEPENDENT_AMBULATORY_CARE_PROVIDER_SITE_OTHER): Payer: 59 | Admitting: Medical-Surgical

## 2021-12-15 ENCOUNTER — Other Ambulatory Visit (HOSPITAL_COMMUNITY): Payer: Self-pay

## 2021-12-15 VITALS — BP 140/96 | HR 75

## 2021-12-15 DIAGNOSIS — I10 Essential (primary) hypertension: Secondary | ICD-10-CM

## 2021-12-15 MED ORDER — VALSARTAN 160 MG PO TABS
160.0000 mg | ORAL_TABLET | Freq: Every day | ORAL | 1 refills | Status: DC
  Filled 2021-12-15: qty 30, 30d supply, fill #0
  Filled 2022-01-18: qty 30, 30d supply, fill #1

## 2021-12-15 NOTE — Progress Notes (Signed)
? ?  Established Patient Office Visit ? ?Subjective   ?Patient ID: Allen Fisher, male    DOB: 25-Aug-1975  Age: 46 y.o. MRN: [id number] ? ?Chief Complaint  ?Patient presents with  ? Hypertension  ? ? ?HPI ?Gerard Cantara is here for blood pressure check. Denies chest pain, shortness of breath or dizziness.   ? ?ROS ? ?  ?Objective:  ?  ? ?BP (!) 144/96   Pulse 75   SpO2 96%  ? ? ?Physical Exam ? ? ?No results found for any visits on 12/15/21. ? ? ? ?The 10-year ASCVD risk score (Arnett DK, et al., 2019) is: 2% ? ?  ?Assessment & Plan:  ?Hypertension - Per Joy, patient advised to increase to 160 mg of the Valsartan. Follow up in 2 weeks for nurse visit blood pressure check with home blood pressure monitor.  ? ?Problem List Items Addressed This Visit   ?None ?Visit Diagnoses   ? ? Essential hypertension    -  Primary  ? ?  ? ? ?Return in about 2 weeks (around 12/29/2021) for nurse visit blood pressure check with home blood pressure monitor. .  ? ? ?Esmond Harps, CMA ? ?

## 2021-12-15 NOTE — Progress Notes (Signed)
Agree with documentation as below.  Okay to take 2 valsartan 80 mg tablets to equal 160 mg in order to exhaust supply.  New prescription sent for valsartan 160 mg tablets for ease of administration. ? ?___________________________________________ ?Clearnce Sorrel, DNP, APRN, FNP-BC ?Primary Care and Sports Medicine ?[place] ? ?

## 2021-12-29 ENCOUNTER — Other Ambulatory Visit (HOSPITAL_COMMUNITY): Payer: Self-pay

## 2021-12-29 ENCOUNTER — Ambulatory Visit (INDEPENDENT_AMBULATORY_CARE_PROVIDER_SITE_OTHER): Payer: 59 | Admitting: Medical-Surgical

## 2021-12-29 DIAGNOSIS — R030 Elevated blood-pressure reading, without diagnosis of hypertension: Secondary | ICD-10-CM | POA: Diagnosis not present

## 2021-12-29 MED ORDER — TOPIRAMATE 50 MG PO TABS
50.0000 mg | ORAL_TABLET | Freq: Two times a day (BID) | ORAL | 0 refills | Status: DC
  Filled 2021-12-29: qty 60, 30d supply, fill #0
  Filled 2022-02-13: qty 60, 30d supply, fill #1
  Filled 2022-03-17: qty 60, 30d supply, fill #2

## 2021-12-29 NOTE — Progress Notes (Signed)
Pt advised.

## 2021-12-29 NOTE — Progress Notes (Signed)
Blood pressures well controlled today.  Continue valsartan 160 mg daily.  Okay to continue tart cherry if he desires.  Recommend monitoring blood pressure at home with goal of 130/80 or less.  Follow a low-sodium diet and get regular intentional exercise.  Continue with Topamax to aid with weight loss.  Plan to follow-up in about 3 months for hypertension. ? ?___________________________________________ ?Thayer Ohm, DNP, APRN, FNP-BC ?Primary Care and Sports Medicine ?[name] ? ? ?

## 2021-12-29 NOTE — Progress Notes (Signed)
Pt presents to clinic today for BP check. Denies any cp/sob/palpitaions/headaches/dizziness or swelling.   ? ?He did not bring in his home bp cuff however he did have some readings: ? ?131/85 ? ?126/83 ? ?132/88 ? ?124/84 ? ?He has been taking 2 oz of Tart cherry daily ? ? ?Pt was advised to continue to monitor his BP at home. Readings should be 130/80 continue taking Valsartan 160 mg and he can continue taking the tart cherry if he wants to.  ? ?Pt also advised to move his scheduled appt out 3 mos unless something comes up and he needs to come in sooner.  ? ?

## 2022-01-14 ENCOUNTER — Ambulatory Visit: Payer: 59 | Admitting: Medical-Surgical

## 2022-01-18 ENCOUNTER — Other Ambulatory Visit (HOSPITAL_COMMUNITY): Payer: Self-pay

## 2022-02-13 ENCOUNTER — Other Ambulatory Visit: Payer: Self-pay | Admitting: Medical-Surgical

## 2022-02-14 ENCOUNTER — Other Ambulatory Visit (HOSPITAL_COMMUNITY): Payer: Self-pay

## 2022-02-16 ENCOUNTER — Other Ambulatory Visit (HOSPITAL_COMMUNITY): Payer: Self-pay

## 2022-02-16 MED ORDER — VALSARTAN 160 MG PO TABS
160.0000 mg | ORAL_TABLET | Freq: Every day | ORAL | 0 refills | Status: DC
  Filled 2022-02-16: qty 30, 30d supply, fill #0
  Filled 2022-03-17: qty 30, 30d supply, fill #1

## 2022-02-27 ENCOUNTER — Emergency Department
Admission: EM | Admit: 2022-02-27 | Discharge: 2022-02-27 | Disposition: A | Discharge status: Home / Self Care | Payer: 59 | Attending: Family Medicine | Admitting: Family Medicine

## 2022-02-27 ENCOUNTER — Encounter: Payer: Self-pay | Admitting: Emergency Medicine

## 2022-02-27 ENCOUNTER — Other Ambulatory Visit: Payer: Self-pay

## 2022-02-27 DIAGNOSIS — R059 Cough, unspecified: Secondary | ICD-10-CM

## 2022-02-27 DIAGNOSIS — J0100 Acute maxillary sinusitis, unspecified: Secondary | ICD-10-CM

## 2022-02-27 DIAGNOSIS — J309 Allergic rhinitis, unspecified: Secondary | ICD-10-CM | POA: Diagnosis not present

## 2022-02-27 MED ORDER — BENZONATATE 200 MG PO CAPS: 200.0000 mg | ORAL_CAPSULE | Freq: Three times a day (TID) | ORAL | 0 refills | Status: AC | PRN

## 2022-02-27 MED ORDER — PREDNISONE 20 MG PO TABS: ORAL_TABLET | ORAL | 0 refills | Status: DC

## 2022-02-27 MED ORDER — PROMETHAZINE-DM 6.25-15 MG/5ML PO SYRP: 5.0000 mL | ORAL_SOLUTION | Freq: Two times a day (BID) | ORAL | 0 refills | Status: DC | PRN

## 2022-02-27 MED ORDER — AMOXICILLIN-POT CLAVULANATE 875-125 MG PO TABS: 1.0000 | ORAL_TABLET | Freq: Two times a day (BID) | ORAL | 0 refills | Status: AC

## 2022-02-27 MED ORDER — FEXOFENADINE HCL 180 MG PO TABS: 180.0000 mg | ORAL_TABLET | Freq: Every day | ORAL | 0 refills | Status: DC

## 2022-02-27 NOTE — ED Provider Notes (Signed)
[name]    CSN: [id number] Arrival date & time: 02/27/22  0853      History   Chief Complaint Chief Complaint  Patient presents with   Cough    HPI Allen Fisher is a 46 y.o. male.   HPI 46 year old male presents with cough for 3 to 4 weeks.  Reports wears his CPAP at night.  Denies fever, chills, wheezing.  Reports history of bronchitis and pneumonia.  PMH significant for HTN, low testosterone, and migraine.  Past Medical History:  Diagnosis Date   Environmental allergies    Frequent sinus infections    Pneumonia     Patient Active Problem List   Diagnosis Date Noted   Cervicogenic headache 04/22/2021   Loud snoring 04/22/2021   Status post cholecystectomy 12/13/2016   Seasonal allergic rhinitis 12/13/2016   Elevated blood pressure reading 12/13/2016   Onychomycosis 12/13/2016   Acne vulgaris 07/04/2014   Hypogonadism male 06/05/2014   Gilbert's syndrome 06/05/2014   IBS (irritable bowel syndrome) 06/05/2014   Class 1 obesity due to excess calories with serious comorbidity and body mass index (BMI) of 32.0 to 32.9 in adult 06/05/2014   Elevated LFTs 06/05/2014   Asthma 10/12/2010    Past Surgical History:  Procedure Laterality Date   BACK SURGERY     CHOLECYSTECTOMY     KNEE SURGERY     SHOULDER SURGERY         Home Medications    Prior to Admission medications   Medication Sig Start Date End Date Taking? Authorizing Provider  AMBULATORY NON FORMULARY MEDICATION Continuous positive airway pressure (CPAP) machine set on AutoPAP (4-20 cmH2O), with all supplemental supplies as needed. 11/17/21  Yes Christen Butter, NP  amoxicillin-clavulanate (AUGMENTIN) 875-125 MG tablet Take 1 tablet by mouth 2 (two) times daily for 10 days. 02/27/22 03/09/22 Yes Trevor Iha, FNP  benzonatate (TESSALON) 200 MG capsule Take 1 capsule (200 mg total) by mouth 3 (three) times daily as needed for up to 7 days for cough. 02/27/22 03/06/22 Yes Trevor Iha, FNP   colesevelam (WELCHOL) 625 MG tablet TAKE 3 TABLETS BY MOUTH  TWICE DAILY WITH A MEAL 12/01/20  Yes Christen Butter, NP  fexofenadine (ALLEGRA ALLERGY) 180 MG tablet Take 1 tablet (180 mg total) by mouth daily for 15 days. 02/27/22 03/14/22 Yes Trevor Iha, FNP  predniSONE (DELTASONE) 20 MG tablet Take 3 tabs PO daily x 5 days. 02/27/22  Yes Trevor Iha, FNP  promethazine-dextromethorphan (PROMETHAZINE-DM) 6.25-15 MG/5ML syrup Take 5 mLs by mouth 2 (two) times daily as needed for cough. 02/27/22  Yes Trevor Iha, FNP  testosterone cypionate (DEPOTESTOSTERONE CYPIONATE) 200 MG/ML injection Inject into the muscle every 14 (fourteen) days.   Yes [provider]  testosterone cypionate (DEPOTESTOSTERONE CYPIONATE) 200 MG/ML injection Inject 1 ml into the muscle every 2 weeks 12/03/21  Yes   topiramate (TOPAMAX) 50 MG tablet Take 1 tablet (50 mg total) by mouth 2 times daily. 12/29/21  Yes Christen Butter, NP  valsartan (DIOVAN) 160 MG tablet Take 1 tablet (160 mg total) by mouth daily. 02/16/22  Yes Christen Butter, NP    Family History Family History  Problem Relation Age of Onset   Cancer Mother        uterine   Arrhythmia Mother    Healthy Father     Social History Social History   Tobacco Use   Smoking status: Never   Smokeless tobacco: Current    Types: Snuff  Vaping Use   Vaping  Use: Never used  Substance Use Topics   Alcohol use: Yes    Alcohol/week: 0.0 standard drinks of alcohol    Comment: occasionally   Drug use: No     Allergies   Patient has no known allergies.   Review of Systems Review of Systems  HENT:  Positive for congestion.   Respiratory:  Positive for cough.   All other systems reviewed and are negative.    Physical Exam Triage Vital Signs ED Triage Vitals  Enc Vitals Group     BP 02/27/22 0909 (!) 141/87     Pulse Rate 02/27/22 0909 66     Resp 02/27/22 0909 18     Temp 02/27/22 0909 98.3 F (36.8 C)     Temp Source 02/27/22 0909 Oral     SpO2  02/27/22 0909 96 %     Weight --      Height --      Head Circumference --      Peak Flow --      Pain Score 02/27/22 0907 0     Pain Loc --      Pain Edu? --      Excl. in GC? --    No data found.  Updated Vital Signs BP (!) 141/87 (BP Location: Right Arm)   Pulse 66   Temp 98.3 F (36.8 C) (Oral)   Resp 18   SpO2 96%       Physical Exam Vitals and nursing note reviewed.  Constitutional:      Appearance: Normal appearance. He is normal weight.  HENT:     Head: Normocephalic and atraumatic.     Right Ear: Tympanic membrane and external ear normal.     Left Ear: Tympanic membrane and external ear normal.     Ears:     Comments: Moderate eustachian tube dysfunction noted bilaterally    Nose:     Comments: Turbinates are erythematous/edematous    Mouth/Throat:     Mouth: Mucous membranes are moist.     Pharynx: Oropharynx is clear.     Comments: Moderate to significant amount of clear drainage of posterior oropharynx noted Eyes:     Extraocular Movements: Extraocular movements intact.     Conjunctiva/sclera: Conjunctivae normal.     Pupils: Pupils are equal, round, and reactive to light.  Cardiovascular:     Rate and Rhythm: Normal rate and regular rhythm.     Pulses: Normal pulses.     Heart sounds: Normal heart sounds. No murmur heard. Pulmonary:     Effort: Pulmonary effort is normal.     Breath sounds: Normal breath sounds. No wheezing, rhonchi or rales.     Comments: Infrequent nonproductive cough noted on exam Musculoskeletal:     Cervical back: Normal range of motion and neck supple.  Skin:    General: Skin is warm and dry.  Neurological:     General: No focal deficit present.     Mental Status: He is alert and oriented to person, place, and time. Mental status is at baseline.      UC Treatments / Results  Labs (all labs ordered are listed, but only abnormal results are displayed) Labs Reviewed - No data to display  EKG   Radiology No results  found.  Procedures Procedures (including critical care time)  Medications Ordered in UC Medications - No data to display  Initial Impression / Assessment and Plan / UC Course  I have reviewed the triage vital signs and  the nursing notes.  Pertinent labs & imaging results that were available during my care of the patient were reviewed by me and considered in my medical decision making (see chart for details).     MDM: 1.  Acute maxillary sinusitis, recurrence not specified-Rx'd Augmentin; 2.  Cough-Rx'd prednisone, Tessalon Perles, Promethazine DM; 3.  Allergic rhinitis-Rx'd Allegra. Instructed patient to take medication as directed with food to completion.  Advised patient to take prednisone and Allegra with first dose of Augmentin for the next 5 of 10 days.  Advised may use Allegra as needed afterwards for concurrent postnasal drip/drainage.  Advised may use Tessalon Perles daily or as needed for cough.  Advised may use Promethazine DM in the evening only for cough due to sedative effects.  Advised patient not to use Promethazine DM and Tessalon Perles together.  Encouraged patient to increase daily water intake while taking these medications.  Advised if symptoms worsen and/or unresolved please follow-up with PCP or here for further evaluation.  Patient discharged home, hemodynamically stable. Final Clinical Impressions(s) / UC Diagnoses   Final diagnoses:  Cough, unspecified type  Acute maxillary sinusitis, recurrence not specified  Allergic rhinitis, unspecified seasonality, unspecified trigger     Discharge Instructions      Instructed patient to take medication as directed with food to completion.  Advised patient to take prednisone and Allegra with first dose of Augmentin for the next 5 of 10 days.  Advised may use Allegra as needed afterwards for concurrent postnasal drip/drainage.  Advised may use Tessalon Perles daily or as needed for cough.  Advised may use Promethazine DM in  the evening only for cough due to sedative effects.  Advised patient not to use Promethazine DM and Tessalon Perles together.  Encouraged patient to increase daily water intake while taking these medications.  Advised if symptoms worsen and/or unresolved please follow-up with PCP or here for further evaluation.     ED Prescriptions     Medication Sig Dispense Auth. Provider   amoxicillin-clavulanate (AUGMENTIN) 875-125 MG tablet Take 1 tablet by mouth 2 (two) times daily for 10 days. 20 tablet Trevor Iha, FNP   predniSONE (DELTASONE) 20 MG tablet Take 3 tabs PO daily x 5 days. 15 tablet Trevor Iha, FNP   fexofenadine [facility] ALLERGY) 180 MG tablet Take 1 tablet (180 mg total) by mouth daily for 15 days. 15 tablet Trevor Iha, FNP   benzonatate (TESSALON) 200 MG capsule Take 1 capsule (200 mg total) by mouth 3 (three) times daily as needed for up to 7 days for cough. 40 capsule Trevor Iha, FNP   promethazine-dextromethorphan (PROMETHAZINE-DM) 6.25-15 MG/5ML syrup Take 5 mLs by mouth 2 (two) times daily as needed for cough. 118 mL Trevor Iha, FNP      PDMP not reviewed this encounter.   [place], Going, FNP 02/27/22 [phone]

## 2022-02-27 NOTE — ED Triage Notes (Signed)
Patient presents to Urgent Care with complaints of cough since 3-4 weeks ago. Patient reports started at night, tickle at the back of throat. Symptoms worsening. Cough is deep and causing hoarseness. Wears CPAP at night. No productive cough. Denies any fever or chills. No wheezing. History of bronchitis. Taking Delysm at night.

## 2022-02-27 NOTE — Discharge Instructions (Addendum)
Instructed patient to take medication as directed with food to completion.  Advised patient to take prednisone and Allegra with first dose of Augmentin for the next 5 of 10 days.  Advised may use Allegra as needed afterwards for concurrent postnasal drip/drainage.  Advised may use Tessalon Perles daily or as needed for cough.  Advised may use Promethazine DM in the evening only for cough due to sedative effects.  Advised patient not to use Promethazine DM and Tessalon Perles together.  Encouraged patient to increase daily water intake while taking these medications.  Advised if symptoms worsen and/or unresolved please follow-up with PCP or here for further evaluation.

## 2022-02-28 ENCOUNTER — Telehealth: Payer: Self-pay

## 2022-02-28 NOTE — Telephone Encounter (Signed)
Telephone call to pt to follow up on recent visit. No answer. HIPAA compliant VM left for return call.

## 2022-03-01 ENCOUNTER — Encounter: Payer: Self-pay | Admitting: Medical-Surgical

## 2022-03-06 ENCOUNTER — Emergency Department
Admission: EM | Admit: 2022-03-06 | Discharge: 2022-03-06 | Disposition: A | Discharge status: Home / Self Care | Payer: 59 | Attending: Family Medicine | Admitting: Family Medicine

## 2022-03-06 ENCOUNTER — Encounter: Payer: Self-pay | Admitting: Emergency Medicine

## 2022-03-06 DIAGNOSIS — R6889 Other general symptoms and signs: Secondary | ICD-10-CM | POA: Diagnosis not present

## 2022-03-06 DIAGNOSIS — R5383 Other fatigue: Secondary | ICD-10-CM

## 2022-03-06 LAB — POC SARS CORONAVIRUS 2 AG -  ED: SARS Coronavirus 2 Ag: NEGATIVE

## 2022-03-06 NOTE — ED Provider Notes (Signed)
[facility]    CSN: [id number] Arrival date & time: 03/06/22  1355      History   Chief Complaint Chief Complaint  Patient presents with   Otalgia    HPI Allen Fisher is a 46 y.o. male.   HPI  [name] is here for upper respiratory infection.  He states has been having symptoms for more than 10 days.  He took a course of Augmentin.  He took 5 days of prednisone.  He has Tessalon and cough syrup.  In spite of this he continues to cough.  He states is getting better but the fatigue has been really prominent.  He had to leave work early a couple days this week because of fatigue.  He still feels very achy and tired today.  He wonders whether he has COVID.  He really has not had fever. Patient has frequent bronchitis.  Also environmental allergies  Past Medical History:  Diagnosis Date   Environmental allergies    Frequent sinus infections    Pneumonia     Patient Active Problem List   Diagnosis Date Noted   Cervicogenic headache 04/22/2021   Loud snoring 04/22/2021   Status post cholecystectomy 12/13/2016   Seasonal allergic rhinitis 12/13/2016   Elevated blood pressure reading 12/13/2016   Onychomycosis 12/13/2016   Acne vulgaris 07/04/2014   Hypogonadism male 06/05/2014   Gilbert's syndrome 06/05/2014   IBS (irritable bowel syndrome) 06/05/2014   Class 1 obesity due to excess calories with serious comorbidity and body mass index (BMI) of 32.0 to 32.9 in adult 06/05/2014   Elevated LFTs 06/05/2014   Asthma 10/12/2010    Past Surgical History:  Procedure Laterality Date   BACK SURGERY     CHOLECYSTECTOMY     KNEE SURGERY     SHOULDER SURGERY         Home Medications    Prior to Admission medications   Medication Sig Start Date End Date Taking? Authorizing Provider  AMBULATORY NON FORMULARY MEDICATION Continuous positive airway pressure (CPAP) machine set on AutoPAP (4-20 cmH2O), with all supplemental supplies as needed. 11/17/21  Yes Christen Butter,  NP  amoxicillin-clavulanate (AUGMENTIN) 875-125 MG tablet Take 1 tablet by mouth 2 (two) times daily for 10 days. 02/27/22 03/09/22 Yes Trevor Iha, FNP  benzonatate (TESSALON) 200 MG capsule Take 1 capsule (200 mg total) by mouth 3 (three) times daily as needed for up to 7 days for cough. 02/27/22 03/06/22 Yes Trevor Iha, FNP  colesevelam (WELCHOL) 625 MG tablet TAKE 3 TABLETS BY MOUTH  TWICE DAILY WITH A MEAL 12/01/20  Yes Christen Butter, NP  fexofenadine (ALLEGRA ALLERGY) 180 MG tablet Take 1 tablet (180 mg total) by mouth daily for 15 days. 02/27/22 03/14/22 Yes Trevor Iha, FNP  predniSONE (DELTASONE) 20 MG tablet Take 3 tabs PO daily x 5 days. 02/27/22  Yes Trevor Iha, FNP  promethazine-dextromethorphan (PROMETHAZINE-DM) 6.25-15 MG/5ML syrup Take 5 mLs by mouth 2 (two) times daily as needed for cough. 02/27/22  Yes Trevor Iha, FNP  testosterone cypionate (DEPOTESTOSTERONE CYPIONATE) 200 MG/ML injection Inject into the muscle every 14 (fourteen) days.   Yes [provider]  testosterone cypionate (DEPOTESTOSTERONE CYPIONATE) 200 MG/ML injection Inject 1 ml into the muscle every 2 weeks 12/03/21  Yes   topiramate (TOPAMAX) 50 MG tablet Take 1 tablet (50 mg total) by mouth 2 times daily. 12/29/21  Yes Christen Butter, NP  valsartan (DIOVAN) 160 MG tablet Take 1 tablet (160 mg total) by mouth daily. 02/16/22  Yes Christen Butter, NP    Family History Family History  Problem Relation Age of Onset   Cancer Mother        uterine   Arrhythmia Mother    Healthy Father     Social History Social History   Tobacco Use   Smoking status: Never   Smokeless tobacco: Current    Types: Snuff  Vaping Use   Vaping Use: Never used  Substance Use Topics   Alcohol use: Yes    Alcohol/week: 0.0 standard drinks of alcohol    Comment: occasionally   Drug use: No     Allergies   Patient has no known allergies.   Review of Systems Review of Systems See HPI  Physical Exam Triage Vital  Signs ED Triage Vitals  Enc Vitals Group     BP 03/06/22 1408 131/83     Pulse Rate 03/06/22 1408 82     Resp 03/06/22 1408 18     Temp 03/06/22 1408 99.4 F (37.4 C)     Temp Source 03/06/22 1408 Oral     SpO2 03/06/22 1408 97 %     Weight 03/06/22 1409 185 lb (83.9 kg)     Height 03/06/22 1409 5\' 6"  (1.676 m)     Head Circumference --      Peak Flow --      Pain Score 03/06/22 1409 0     Pain Loc --      Pain Edu? --      Excl. in GC? --    No data found.  Updated Vital Signs BP 131/83 (BP Location: Left Arm)   Pulse 82   Temp 99.4 F (37.4 C) (Oral)   Resp 18   Ht 5\' 6"  (1.676 m)   Wt 83.9 kg   SpO2 97%   BMI 29.86 kg/m      Physical Exam Vitals reviewed.  Constitutional:      General: He is not in acute distress.    Appearance: He is well-developed.  HENT:     Head: Normocephalic and atraumatic.     Right Ear: Tympanic membrane and ear canal normal.     Left Ear: Tympanic membrane and ear canal normal.     Nose: Nose normal. No congestion.     Mouth/Throat:     Pharynx: No posterior oropharyngeal erythema.  Eyes:     Conjunctiva/sclera: Conjunctivae normal.     Pupils: Pupils are equal, round, and reactive to light.  Cardiovascular:     Rate and Rhythm: Normal rate and regular rhythm.     Heart sounds: Normal heart sounds.  Pulmonary:     Effort: Pulmonary effort is normal. No respiratory distress.     Breath sounds: Normal breath sounds. No wheezing, rhonchi or rales.  Abdominal:     General: There is no distension.     Palpations: Abdomen is soft.  Musculoskeletal:        General: Normal range of motion.     Cervical back: Normal range of motion and neck supple.  Lymphadenopathy:     Cervical: No cervical adenopathy.  Skin:    General: Skin is warm and dry.  Neurological:     Mental Status: He is alert.  Psychiatric:        Mood and Affect: Mood normal.        Behavior: Behavior normal.      UC Treatments / Results  Labs (all labs  ordered are listed, but only abnormal results are  displayed) Labs Reviewed  CBC WITH DIFFERENTIAL/PLATELET  POC SARS CORONAVIRUS 2 AG -  ED    EKG   Radiology No results found.  Procedures Procedures (including critical care time)  Medications Ordered in UC Medications - No data to display  Initial Impression / Assessment and Plan / UC Course  I have reviewed the triage vital signs and the nursing notes.  Pertinent labs & imaging results that were available during my care of the patient were reviewed by me and considered in my medical decision making (see chart for details).     Because of patient's concern regarding significant fatigue we will get a CBC.  Follow-up with PCP if fails to improve Final Clinical Impressions(s) / UC Diagnoses   Final diagnoses:  Flu-like symptoms     Discharge Instructions      Continue to drink lots of fluids May use over-the-counter cough and cold medicines.  Cough medicine as prescribed On doing a blood count to check for infection and anemia that might cause your ongoing fatigue    ED Prescriptions   None    I have reviewed the PDMP during this encounter.   Eustace Moore, MD 03/06/22 [phone]

## 2022-03-06 NOTE — Discharge Instructions (Signed)
Continue to drink lots of fluids May use over-the-counter cough and cold medicines.  Cough medicine as prescribed On doing a blood count to check for infection and anemia that might cause your ongoing fatigue

## 2022-03-06 NOTE — ED Triage Notes (Signed)
Patient c/o left ear pain, fatigue, some nasal congestion on and off for several days.  Patient was seen a couple of days.  Patient has been on antibiotics and prednisone, still no better.  Not sure if he has COVID or not.

## 2022-03-07 ENCOUNTER — Telehealth: Payer: Self-pay | Admitting: Emergency Medicine

## 2022-03-07 LAB — CBC WITH DIFFERENTIAL/PLATELET
Absolute Monocytes: 678 cells/uL (ref 200–950)
Basophils Absolute: 18 cells/uL (ref 0–200)
Basophils Relative: 0.2 %
Eosinophils Absolute: 62 cells/uL (ref 15–500)
Eosinophils Relative: 0.7 %
HCT: 51 % — ABNORMAL HIGH (ref 38.5–50.0)
Hemoglobin: 17.3 g/dL — ABNORMAL HIGH (ref 13.2–17.1)
Lymphs Abs: 2358 cells/uL (ref 850–3900)
MCH: 29.9 pg (ref 27.0–33.0)
MCHC: 33.9 g/dL (ref 32.0–36.0)
MCV: 88.1 fL (ref 80.0–100.0)
MPV: 13.2 fL — ABNORMAL HIGH (ref 7.5–12.5)
Monocytes Relative: 7.7 %
Neutro Abs: 5685 cells/uL (ref 1500–7800)
Neutrophils Relative %: 64.6 %
Platelets: 194 10*3/uL (ref 140–400)
RBC: 5.79 10*6/uL (ref 4.20–5.80)
RDW: 12.9 % (ref 11.0–15.0)
Total Lymphocyte: 26.8 %
WBC: 8.8 10*3/uL (ref 3.8–10.8)

## 2022-03-17 ENCOUNTER — Other Ambulatory Visit (HOSPITAL_COMMUNITY): Payer: Self-pay

## 2022-03-29 ENCOUNTER — Telehealth: Payer: 59 | Admitting: Physician Assistant

## 2022-03-29 DIAGNOSIS — B353 Tinea pedis: Secondary | ICD-10-CM | POA: Diagnosis not present

## 2022-03-29 MED ORDER — CLOTRIMAZOLE-BETAMETHASONE 1-0.05 % EX CREA: 1.0000 | TOPICAL_CREAM | Freq: Every day | CUTANEOUS | 0 refills | Status: DC

## 2022-03-29 NOTE — Progress Notes (Signed)
[name], Allen Fisher are scheduled for a virtual visit with your provider today.    Just as we do with appointments in the office, we must obtain your consent to participate.  Your consent will be active for this visit and any virtual visit you may have with one of our providers in the next 365 days.    If you have a MyChart account, I can also send a copy of this consent to you electronically.  All virtual visits are billed to your insurance company just like a traditional visit in the office.  As this is a virtual visit, video technology does not allow for your provider to perform a traditional examination.  This may limit your provider's ability to fully assess your condition.  If your provider identifies any concerns that need to be evaluated in person or the need to arrange testing such as labs, EKG, etc, we will make arrangements to do so.    Although advances in technology are sophisticated, we cannot ensure that it will always work on either your end or our end.  If the connection with a video visit is poor, we may have to switch to a telephone visit.  With either a video or telephone visit, we are not always able to ensure that we have a secure connection.   I need to obtain your verbal consent now.   Are you willing to proceed with your visit today?   Allen Fisher has provided verbal consent on 03/29/2022 for a virtual visit (video or telephone).   Allen Fisher, [place] 03/29/2022  4:39 PM   Date:  03/29/2022   ID:  Allen Fisher, DOB [birth year], MRN [id number]  Patient Location: Home Provider Location: Home Office   Participants: Patient and Provider for Visit and Wrap up  Method of visit: Video  Location of Patient: Home Location of Provider: Home Office Consent was obtain for visit over the video. Services rendered by provider: Visit was performed via video  A video enabled telemedicine application was used and I verified that I am speaking with the correct person using two  identifiers.  PCP:  Allen Butter, NP   Chief Complaint:  athlete's foot  History of Present Illness:    Allen Fisher is a 46 y.o. male with history as stated below. Presents video telehealth for an acute care visit  Pt c/o athletes foot for the last 3 weeks. He has tried multiple OTC meds without relief. States sxs affect both feet. Hx same. No fevers, chills.   Past Medical, Surgical, Social History, Allergies, and Medications have been Reviewed.  Past Medical History:  Diagnosis Date   Environmental allergies    Frequent sinus infections    Pneumonia     Current Meds  Medication Sig   clotrimazole-betamethasone (LOTRISONE) cream Apply 1 Application topically daily.     Allergies:   Patient has no known allergies.   ROS See HPI for history of present illness.  Physical Exam Skin:    Comments: Rash consistent with athletes foot to the bilat 4th and 5th toes             MDM: pt with recurrent tinea pedis. Otc meds not working. Rx sent.  There are no diagnoses linked to this encounter.   Time:   Today, I have spent 6 minutes with the patient with telehealth technology discussing the above problems, reviewing the chart, previous notes, medications and orders.    Tests Ordered: No orders of the defined types were placed  in this encounter.   Medication Changes: Meds ordered this encounter  Medications   clotrimazole-betamethasone (LOTRISONE) cream    Sig: Apply 1 Application topically daily.    Dispense:  30 g    Refill:  0    Order Specific Question:   Supervising Provider    Answer:   Eber Hong [[id number]]     Disposition:  Follow up  Signed, Allen Meres, PA-C  03/29/2022 4:39 PM

## 2022-03-29 NOTE — Patient Instructions (Signed)
  [name], thank you for joining Karrie Meres, PA-C for today's virtual visit.  While this provider is not your primary care provider (PCP), if your PCP is located in our provider database this encounter information will be shared with them immediately following your visit.  Consent: (Patient) Allen Fisher provided verbal consent for this virtual visit at the beginning of the encounter.  Current Medications:  Current Outpatient Medications:    clotrimazole-betamethasone (LOTRISONE) cream, Apply 1 Application topically daily., Disp: 30 g, Rfl: 0   AMBULATORY NON FORMULARY MEDICATION, Continuous positive airway pressure (CPAP) machine set on AutoPAP (4-20 cmH2O), with all supplemental supplies as needed., Disp: 1 each, Rfl: 0   colesevelam (WELCHOL) 625 MG tablet, TAKE 3 TABLETS BY MOUTH  TWICE DAILY WITH A MEAL, Disp: 540 tablet, Rfl: 3   fexofenadine (ALLEGRA ALLERGY) 180 MG tablet, Take 1 tablet (180 mg total) by mouth daily for 15 days., Disp: 15 tablet, Rfl: 0   predniSONE (DELTASONE) 20 MG tablet, Take 3 tabs PO daily x 5 days., Disp: 15 tablet, Rfl: 0   promethazine-dextromethorphan (PROMETHAZINE-DM) 6.25-15 MG/5ML syrup, Take 5 mLs by mouth 2 (two) times daily as needed for cough., Disp: 118 mL, Rfl: 0   testosterone cypionate (DEPOTESTOSTERONE CYPIONATE) 200 MG/ML injection, Inject into the muscle every 14 (fourteen) days., Disp: , Rfl:    testosterone cypionate (DEPOTESTOSTERONE CYPIONATE) 200 MG/ML injection, Inject 1 ml into the muscle every 2 weeks, Disp: 10 mL, Rfl: 3   topiramate (TOPAMAX) 50 MG tablet, Take 1 tablet (50 mg total) by mouth 2 times daily., Disp: 180 tablet, Rfl: 0   valsartan (DIOVAN) 160 MG tablet, Take 1 tablet (160 mg total) by mouth daily., Disp: 90 tablet, Rfl: 0   Medications ordered in this encounter:  Meds ordered this encounter  Medications   clotrimazole-betamethasone (LOTRISONE) cream    Sig: Apply 1 Application topically daily.    Dispense:   30 g    Refill:  0    Order Specific Question:   Supervising Provider    Answer:   Hyacinth Meeker, BRIAN [[id number]]     *If you need refills on other medications prior to your next appointment, please contact your pharmacy*  Follow-Up: Call back or seek an in-person evaluation if the symptoms worsen or if the condition fails to improve as anticipated.  Other Instructions Use the cream as directed   Follow up with your regular doctor in 1 week for reassessment and seek care sooner if your symptoms worsen or fail to improve.    If you have been instructed to have an in-person evaluation today at a local Urgent Care facility, please use the link below. It will take you to a list of all of our available [facility], including address, phone number and hours of operation. Please do not delay care.  [facility]  If you or a family member do not have a primary care provider, use the link below to schedule a visit and establish care. When you choose a [facility]  primary care physician or advanced practice provider, you gain a long-term partner in health. Find a Primary Care Provider  Learn more about [facility]'s in-office and virtual care options: [facility] - Get Care Now

## 2022-04-20 ENCOUNTER — Encounter: Payer: Self-pay | Admitting: Medical-Surgical

## 2022-04-20 ENCOUNTER — Other Ambulatory Visit (HOSPITAL_COMMUNITY): Payer: Self-pay

## 2022-04-20 ENCOUNTER — Ambulatory Visit (INDEPENDENT_AMBULATORY_CARE_PROVIDER_SITE_OTHER): Payer: 59 | Admitting: Medical-Surgical

## 2022-04-20 VITALS — BP 114/77 | HR 68 | Resp 20 | Ht 66.0 in | Wt 197.2 lb

## 2022-04-20 DIAGNOSIS — Z23 Encounter for immunization: Secondary | ICD-10-CM

## 2022-04-20 DIAGNOSIS — Z9989 Dependence on other enabling machines and devices: Secondary | ICD-10-CM

## 2022-04-20 DIAGNOSIS — G4733 Obstructive sleep apnea (adult) (pediatric): Secondary | ICD-10-CM

## 2022-04-20 DIAGNOSIS — E6609 Other obesity due to excess calories: Secondary | ICD-10-CM

## 2022-04-20 DIAGNOSIS — K580 Irritable bowel syndrome with diarrhea: Secondary | ICD-10-CM | POA: Diagnosis not present

## 2022-04-20 DIAGNOSIS — G4486 Cervicogenic headache: Secondary | ICD-10-CM | POA: Diagnosis not present

## 2022-04-20 DIAGNOSIS — Z6831 Body mass index (BMI) 31.0-31.9, adult: Secondary | ICD-10-CM

## 2022-04-20 DIAGNOSIS — K529 Noninfective gastroenteritis and colitis, unspecified: Secondary | ICD-10-CM | POA: Diagnosis not present

## 2022-04-20 DIAGNOSIS — R030 Elevated blood-pressure reading, without diagnosis of hypertension: Secondary | ICD-10-CM | POA: Diagnosis not present

## 2022-04-20 MED ORDER — COLESEVELAM HCL 625 MG PO TABS
ORAL_TABLET | ORAL | 3 refills | Status: DC
  Filled 2022-04-20: qty 180, 30d supply, fill #0

## 2022-04-20 MED ORDER — VALSARTAN 160 MG PO TABS
160.0000 mg | ORAL_TABLET | Freq: Every day | ORAL | 1 refills | Status: DC
  Filled 2022-04-20: qty 30, 30d supply, fill #0
  Filled 2022-05-23: qty 30, 30d supply, fill #1
  Filled 2022-06-27: qty 30, 30d supply, fill #2

## 2022-04-20 MED ORDER — TOPIRAMATE 100 MG PO TABS
100.0000 mg | ORAL_TABLET | Freq: Two times a day (BID) | ORAL | 1 refills | Status: DC
  Filled 2022-04-20: qty 60, 30d supply, fill #0

## 2022-04-20 NOTE — Progress Notes (Signed)
Established Patient Office Visit  Subjective   Patient ID: Yvonne Petite, male   DOB: 1976/02/04 Age: 46 y.o. MRN: [id number]   Chief Complaint  Patient presents with   Medication Refill    HPI Pleasant 46 year old male presenting today for the following:  Elevated blood pressure: Taking valsartan 160 mg daily, tolerating side effects.  Drinks cherry juice in the mornings because he read this helped lower blood pressure.  Checking blood pressures at home with home readings in the 120s-130s/80s. Denies CP, SOB, palpitations, lower extremity edema, dizziness, headaches, or vision changes.  Weight concerns: Was previously taking Topamax 50 mg twice daily however he did stop this when he went on vacation.  Notes that the Topamax effects continued while he was on vacation however when he restarted the medication on his return, he did not notice any of the appetite suppression, taste changes, or side effects such as paresthesias.  He went off the medication for several weeks completely to give his body a reset.  He has since restarted medication at 50 mg twice daily and has again noted no effects of the medication.  He is interested in increasing his dose if this is safe.  Over the last couple of weeks, he has not been exercising as regularly because school started back however he does plan to get back into his routine.  IBS/chronic diarrhea: Taking WelChol up to 3 tablets twice daily as prescribed.  Notes that he does not always take the dose that is prescribed and sometimes cuts it back depending on his symptoms.  Has been on this for a while and notes that his issues are stable.  Sleep apnea: He is using his CPAP as much as he can tolerate.  He has a chart on his phone and it keeps track of his overall use.  Some nights he does great with up to 9 hours of wear time but most nights he ends up waking up at the 3-hour mark and taking it off.  He is aware that he is removing it however he does not seem  to be able to stop the behavior.  He is using a mask that fits well and has a good seal and notes that the machine is actually very quiet.  He has tried troubleshooting on multiple fronts but has not been able to fix this issue.  Some nights he will take melatonin but notes this is a hit or miss to help with sleep.  A couple of nights he ended up taking Benadryl after being stung by a hornet and notes that he got over 9 hours those nights.  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  He has heard about inspire and is interested in seeing if he is a candidate for this.   Objective:    Vitals:   04/20/22 0818  BP: 114/77  Pulse: 68  Resp: 20  Height: 5\' 6"  (1.676 m)  Weight: 197 lb 3.2 oz (89.4 kg)  SpO2: 97%  BMI (Calculated): 31.84   Physical Exam Vitals reviewed.  Constitutional:      General: He is not in acute distress.    Appearance: Normal appearance. He is obese. He is not ill-appearing.  HENT:     Head: Normocephalic.  Cardiovascular:     Rate and Rhythm: Normal rate.  Pulses: Normal pulses.     Heart sounds: Normal heart sounds. No murmur heard.    No friction rub. No gallop.  Pulmonary:     Effort: Pulmonary effort is normal. No respiratory distress.     Breath sounds: Normal breath sounds.  Skin:    General: Skin is warm and dry.  Neurological:     Mental Status: He is alert and oriented to person, place, and time.  Psychiatric:        Mood and Affect: Mood normal.        Behavior: Behavior normal.        Thought Content: Thought content normal.        Judgment: Judgment normal.   No results found for this or any previous visit (from the past 24 hour(s)).     The 10-year ASCVD risk score (Arnett DK, et al., 2019) is: 1.3%   Values used to calculate the score:     Age: 70 years     Sex: Male     Is Non-Hispanic African American:  No     Diabetic: No     Tobacco smoker: No     Systolic Blood Pressure: 114 mmHg     Is BP treated: Yes     HDL Cholesterol: 41 mg/dL     Total Cholesterol: 148 mg/dL   Assessment & Plan:   1. Chronic diarrhea 2. Irritable bowel syndrome with diarrhea Stable. Continue Welchol. - colesevelam (WELCHOL) 625 MG tablet; TAKE 3 TABLETS BY MOUTH  TWICE DAILY WITH A MEAL  Dispense: 540 tablet; Refill: 3  3. Cervicogenic headache Stable.  See below.  4. Elevated blood pressure reading Blood pressure well controlled.  Continue valsartan 160 mg daily.  Continue monitoring at home with goal of 130/80 or less.  Low-sodium diet, weight loss to a healthy weight, and regular intentional exercise recommended.  5. Need for influenza vaccination Patient due for flu shot however he left the facility prior to being able to administer.  If he would like to get this as a nurse visit, okay to schedule at his convenience.  6. OSA on CPAP He has worked very hard on being compliant with his CPAP however he just cannot seem to tolerate it past the 3-4-hour mark most nights.  He is having good benefit when he is able to tolerate it for the full night so optimally, we would try troubleshooting.  He is interested in discussing inspire so referring to sleep medicine to talk about options. - Ambulatory referral to Sleep Studies  7. Class 1 obesity due to excess calories with serious comorbidity and body mass index (BMI) of 31.0 to 31.9 in adult He is working on making lifestyle and dietary changes however we have not been able to find a medicine that works well for him to help with appetite suppression and weight loss.  Increasing Topamax to 100 mg twice daily.  Advised to monitor for potential side effects and let me know should he have any issues.  Return in about 3 months (around 07/20/2022) for weight check.  ___________________________________________ Thayer Ohm, DNP, APRN, FNP-BC Primary Care and Sports  Medicine [facility]

## 2022-04-28 ENCOUNTER — Encounter: Payer: Self-pay | Admitting: Medical-Surgical

## 2022-04-28 DIAGNOSIS — K529 Noninfective gastroenteritis and colitis, unspecified: Secondary | ICD-10-CM

## 2022-04-29 MED ORDER — COLESEVELAM HCL 625 MG PO TABS: ORAL_TABLET | ORAL | 3 refills | Status: DC

## 2022-05-02 ENCOUNTER — Encounter: Payer: Self-pay | Admitting: Medical-Surgical

## 2022-05-04 MED ORDER — NALTREXONE-BUPROPION HCL ER 8-90 MG PO TB12: ORAL_TABLET | ORAL | 0 refills | Status: DC

## 2022-05-04 NOTE — Addendum Note (Signed)
Addended bySamuel Bouche on: 05/04/2022 07:49 AM   Modules accepted: Orders

## 2022-05-07 ENCOUNTER — Telehealth: Payer: Self-pay

## 2022-05-07 NOTE — Telephone Encounter (Addendum)
Initiated Prior authorization [id number] 8-90MG  er tablets Via: Covermymeds Case/Key:[id number] Status: approved  as of 05/07/22 Reason:approved through 09/06/2022. Notified Pt via: Mychart

## 2022-05-23 ENCOUNTER — Other Ambulatory Visit (HOSPITAL_COMMUNITY): Payer: Self-pay

## 2022-05-24 ENCOUNTER — Other Ambulatory Visit (HOSPITAL_COMMUNITY): Payer: Self-pay

## 2022-06-07 ENCOUNTER — Other Ambulatory Visit: Payer: Self-pay | Admitting: Medical-Surgical

## 2022-06-07 NOTE — Telephone Encounter (Signed)
Patient due for follow up after 4 weeks on the medication.

## 2022-06-08 ENCOUNTER — Other Ambulatory Visit (HOSPITAL_COMMUNITY): Payer: Self-pay

## 2022-06-08 ENCOUNTER — Encounter: Payer: Self-pay | Admitting: Medical-Surgical

## 2022-06-08 MED ORDER — NALTREXONE-BUPROPION HCL ER 8-90 MG PO TB12
2.0000 | ORAL_TABLET | Freq: Two times a day (BID) | ORAL | 0 refills | Status: DC
  Filled 2022-06-08: qty 120, 30d supply, fill #0

## 2022-06-08 NOTE — Telephone Encounter (Signed)
30 day refill sent.

## 2022-06-09 ENCOUNTER — Ambulatory Visit
Admission: RE | Admit: 2022-06-09 | Discharge: 2022-06-09 | Disposition: A | Discharge status: Home / Self Care | Payer: 59 | Source: Ambulatory Visit | Attending: Family Medicine | Admitting: Family Medicine

## 2022-06-09 ENCOUNTER — Other Ambulatory Visit (HOSPITAL_COMMUNITY): Payer: Self-pay

## 2022-06-09 VITALS — BP 138/90 | HR 85 | Temp 97.9°F | Resp 18

## 2022-06-09 DIAGNOSIS — R059 Cough, unspecified: Secondary | ICD-10-CM | POA: Diagnosis not present

## 2022-06-09 MED ORDER — PROMETHAZINE-DM 6.25-15 MG/5ML PO SYRP: 5.0000 mL | ORAL_SOLUTION | Freq: Two times a day (BID) | ORAL | 0 refills | Status: DC | PRN

## 2022-06-09 MED ORDER — DOXYCYCLINE HYCLATE 100 MG PO CAPS: 100.0000 mg | ORAL_CAPSULE | Freq: Two times a day (BID) | ORAL | 0 refills | Status: AC

## 2022-06-09 MED ORDER — PREDNISONE 10 MG (21) PO TBPK: ORAL_TABLET | Freq: Every day | ORAL | 0 refills | Status: DC

## 2022-06-09 NOTE — ED Provider Notes (Signed)
[name]    CSN: [id number] Arrival date & time: 06/09/22  1517      History   Chief Complaint Chief Complaint  Patient presents with   Cough    HPI Allen Fisher is a 46 y.o. male.   HPI Very pleasant 46 year old male presents with cough for 1 week.  Reports cough is nonproductive and has used OTC DayQuil NyQuil and Mucinex as needed.  Reports history of bronchitis.  PMH significant for Gilbert's syndrome, obesity, and cervicogenic headache.  Past Medical History:  Diagnosis Date   Environmental allergies    Frequent sinus infections    Pneumonia     Patient Active Problem List   Diagnosis Date Noted   Cervicogenic headache 04/22/2021   Loud snoring 04/22/2021   Impingement syndrome of right shoulder region 09/04/2017   Status post cholecystectomy 12/13/2016   Seasonal allergic rhinitis 12/13/2016   Elevated blood pressure reading 12/13/2016   Onychomycosis 12/13/2016   Acne vulgaris 07/04/2014   Hypogonadism male 06/05/2014   Gilbert's syndrome 06/05/2014   IBS (irritable bowel syndrome) 06/05/2014   Class 1 obesity due to excess calories with serious comorbidity and body mass index (BMI) of 32.0 to 32.9 in adult 06/05/2014   Elevated LFTs 06/05/2014   Asthma 10/12/2010    Past Surgical History:  Procedure Laterality Date   BACK SURGERY     CHOLECYSTECTOMY     KNEE SURGERY     SHOULDER SURGERY         Home Medications    Prior to Admission medications   Medication Sig Start Date End Date Taking? Authorizing Provider  doxycycline (VIBRAMYCIN) 100 MG capsule Take 1 capsule (100 mg total) by mouth 2 (two) times daily for 10 days. 06/09/22 06/19/22 Yes Eliezer Lofts, FNP  predniSONE (STERAPRED UNI-PAK 21 TAB) 10 MG (21) TBPK tablet Take by mouth daily. Take 6 tabs by mouth daily  for 2 days, then 5 tabs for 2 days, then 4 tabs for 2 days, then 3 tabs for 2 days, 2 tabs for 2 days, then 1 tab by mouth daily for 2 days 06/09/22  Yes Eliezer Lofts, FNP  promethazine-dextromethorphan (PROMETHAZINE-DM) 6.25-15 MG/5ML syrup Take 5 mLs by mouth 2 (two) times daily as needed for cough. 06/09/22  Yes Eliezer Lofts, FNP  AMBULATORY NON FORMULARY MEDICATION Continuous positive airway pressure (CPAP) machine set on AutoPAP (4-20 cmH2O), with all supplemental supplies as needed. 11/17/21   Samuel Bouche, NP  colesevelam (WELCHOL) 625 MG tablet TAKE 3 TABLETS BY MOUTH  TWICE DAILY WITH A MEAL 04/29/22   Samuel Bouche, NP  fexofenadine (ALLEGRA ALLERGY) 180 MG tablet Take 1 tablet (180 mg total) by mouth daily for 15 days. 02/27/22 03/14/22  Eliezer Lofts, FNP  Naltrexone-buPROPion HCl ER 8-90 MG TB12 Take 2 tablets by mouth 2 (two) times daily. 06/08/22   Samuel Bouche, NP  testosterone cypionate (DEPOTESTOSTERONE CYPIONATE) 200 MG/ML injection Inject 1 ml into the muscle every 2 weeks 12/03/21     valsartan (DIOVAN) 160 MG tablet Take 1 tablet (160 mg total) by mouth daily. 04/20/22   Samuel Bouche, NP    Family History Family History  Problem Relation Age of Onset   Cancer Mother        uterine   Arrhythmia Mother    Healthy Father     Social History Social History   Tobacco Use   Smoking status: Never   Smokeless tobacco: Current    Types: Snuff  Vaping Use  Vaping Use: Never used  Substance Use Topics   Alcohol use: Yes    Alcohol/week: 0.0 standard drinks of alcohol    Comment: occasionally   Drug use: No     Allergies   Patient has no known allergies.   Review of Systems Review of Systems  Respiratory:  Positive for cough.   All other systems reviewed and are negative.    Physical Exam Triage Vital Signs ED Triage Vitals  Enc Vitals Group     BP 06/09/22 1523 (!) 138/90     Pulse Rate 06/09/22 1523 85     Resp 06/09/22 1523 18     Temp 06/09/22 1523 97.9 F (36.6 C)     Temp Source 06/09/22 1523 Oral     SpO2 06/09/22 1523 97 %     Weight --      Height --      Head Circumference --      Peak Flow --       Pain Score 06/09/22 1524 0     Pain Loc --      Pain Edu? --      Excl. in GC? --    No data found.  Updated Vital Signs BP (!) 138/90 (BP Location: Right Arm)   Pulse 85   Temp 97.9 F (36.6 C) (Oral)   Resp 18   SpO2 97%       Physical Exam Vitals and nursing note reviewed.  Constitutional:      General: He is not in acute distress.    Appearance: Normal appearance. He is normal weight. He is ill-appearing.  HENT:     Head: Normocephalic and atraumatic.     Right Ear: Tympanic membrane, ear canal and external ear normal.     Left Ear: Tympanic membrane, ear canal and external ear normal.     Mouth/Throat:     Mouth: Mucous membranes are moist.     Pharynx: Oropharynx is clear.  Eyes:     Extraocular Movements: Extraocular movements intact.     Conjunctiva/sclera: Conjunctivae normal.     Pupils: Pupils are equal, round, and reactive to light.  Cardiovascular:     Rate and Rhythm: Normal rate and regular rhythm.     Pulses: Normal pulses.     Heart sounds: Normal heart sounds.  Pulmonary:     Effort: Pulmonary effort is normal.     Breath sounds: Normal breath sounds. No wheezing, rhonchi or rales.     Comments: Frequent nonproductive cough noted on exam Musculoskeletal:        General: Normal range of motion.     Cervical back: Normal range of motion and neck supple. No tenderness.  Lymphadenopathy:     Cervical: Cervical adenopathy present.  Skin:    General: Skin is warm and dry.  Neurological:     General: No focal deficit present.     Mental Status: He is alert and oriented to person, place, and time. Mental status is at baseline.      UC Treatments / Results  Labs (all labs ordered are listed, but only abnormal results are displayed) Labs Reviewed - No data to display  EKG   Radiology No results found.  Procedures Procedures (including critical care time)  Medications Ordered in UC Medications - No data to display  Initial Impression /  Assessment and Plan / UC Course  I have reviewed the triage vital signs and the nursing notes.  Pertinent labs & imaging results  that were available during my care of the patient were reviewed by me and considered in my medical decision making (see chart for details).     MDM: 1.  Cough-Rx'd Doxycycline, Sterapred Unipak, Promethazine DM. Advised patient to take medication as directed with food to completion.  Advised patient to take Sterapred Unipak with first dose of doxycycline for the next 10 days.  Advised patient may use Promethazine DM at night for cough prior to sleep due to sedative effects. Encouraged patient increase daily water intake while taking these medications.  Advised if symptoms worsen and/or unresolved please follow-up with PCP or here for further evaluation.  Patient discharged home, hemodynamically stable. Final Clinical Impressions(s) / UC Diagnoses   Final diagnoses:  Cough, unspecified type     Discharge Instructions      Advised patient to take medication as directed with food to completion.  Advised patient to take Sterapred Unipak with first dose of doxycycline for the next 10 days.  Advised patient may use Promethazine DM at night for cough prior to sleep due to sedative effects. Encouraged patient increase daily water intake while taking these medications.  Advised if symptoms worsen and/or unresolved please follow-up with PCP or here for further evaluation.     ED Prescriptions     Medication Sig Dispense Auth. Provider   doxycycline (VIBRAMYCIN) 100 MG capsule Take 1 capsule (100 mg total) by mouth 2 (two) times daily for 10 days. 20 capsule Trevor Iha, FNP   predniSONE (STERAPRED UNI-PAK 21 TAB) 10 MG (21) TBPK tablet Take by mouth daily. Take 6 tabs by mouth daily  for 2 days, then 5 tabs for 2 days, then 4 tabs for 2 days, then 3 tabs for 2 days, 2 tabs for 2 days, then 1 tab by mouth daily for 2 days 42 tablet Trevor Iha, FNP    promethazine-dextromethorphan (PROMETHAZINE-DM) 6.25-15 MG/5ML syrup Take 5 mLs by mouth 2 (two) times daily as needed for cough. 118 mL Trevor Iha, FNP      PDMP not reviewed this encounter.   Yash, Cacciola, FNP 06/09/22 1551

## 2022-06-09 NOTE — Discharge Instructions (Addendum)
Advised patient to take medication as directed with food to completion.  Advised patient to take Sterapred Unipak with first dose of doxycycline for the next 10 days.  Advised patient may use Promethazine DM at night for cough prior to sleep due to sedative effects. Encouraged patient increase daily water intake while taking these medications.  Advised if symptoms worsen and/or unresolved please follow-up with PCP or here for further evaluation.

## 2022-06-09 NOTE — ED Triage Notes (Signed)
Pt c/o cough x 1 week. Cough is non productive. Dayquil, nyquil and Mucinex prn. Hx of bronchitis.

## 2022-06-10 ENCOUNTER — Telehealth: Payer: Self-pay | Admitting: Emergency Medicine

## 2022-06-10 NOTE — Telephone Encounter (Signed)
LMTRC.  Advised if doing well to disregard the call.  Any questions or concerns, feel free to contact the office. 

## 2022-06-19 ENCOUNTER — Telehealth: Payer: Self-pay

## 2022-06-19 NOTE — Telephone Encounter (Signed)
Pt called and stated that he still had a cough and would like to have a different medication called in.   Pt was instructed to come back into clinic to be reevaluated since his symptoms haven't resolved and it's been a week or more.

## 2022-06-27 ENCOUNTER — Other Ambulatory Visit: Payer: Self-pay | Admitting: Medical-Surgical

## 2022-06-27 ENCOUNTER — Other Ambulatory Visit (HOSPITAL_COMMUNITY): Payer: Self-pay

## 2022-06-28 ENCOUNTER — Ambulatory Visit: Payer: 59 | Admitting: Medical-Surgical

## 2022-06-28 ENCOUNTER — Other Ambulatory Visit (HOSPITAL_COMMUNITY): Payer: Self-pay

## 2022-06-28 ENCOUNTER — Encounter: Payer: Self-pay | Admitting: Medical-Surgical

## 2022-06-28 VITALS — BP 149/93 | HR 78 | Resp 20 | Ht 66.0 in | Wt 196.5 lb

## 2022-06-28 DIAGNOSIS — Z7689 Persons encountering health services in other specified circumstances: Secondary | ICD-10-CM | POA: Diagnosis not present

## 2022-06-28 DIAGNOSIS — E291 Testicular hypofunction: Secondary | ICD-10-CM | POA: Diagnosis not present

## 2022-06-28 DIAGNOSIS — I10 Essential (primary) hypertension: Secondary | ICD-10-CM | POA: Insufficient documentation

## 2022-06-28 MED ORDER — VALSARTAN 320 MG PO TABS
320.0000 mg | ORAL_TABLET | Freq: Every day | ORAL | 1 refills | Status: DC
  Filled 2022-06-28: qty 30, 30d supply, fill #0
  Filled 2022-07-27: qty 30, 30d supply, fill #1
  Filled 2022-08-23: qty 30, 30d supply, fill #2
  Filled 2022-09-26: qty 30, 30d supply, fill #3
  Filled 2022-10-28: qty 30, 30d supply, fill #4
  Filled 2022-11-25: qty 30, 30d supply, fill #5

## 2022-06-28 MED ORDER — TESTOSTERONE CYPIONATE 200 MG/ML IM SOLN
200.0000 mg | INTRAMUSCULAR | 3 refills | Status: DC
  Filled 2022-06-28: qty 2, 28d supply, fill #0
  Filled 2022-07-27: qty 2, 28d supply, fill #1
  Filled 2022-08-23: qty 2, 28d supply, fill #2
  Filled 2022-09-26: qty 2, 28d supply, fill #3
  Filled 2022-10-28: qty 2, 28d supply, fill #4

## 2022-06-28 MED ORDER — NALTREXONE-BUPROPION HCL ER 8-90 MG PO TB12
2.0000 | ORAL_TABLET | Freq: Two times a day (BID) | ORAL | 0 refills | Status: DC
  Filled 2022-06-28 – 2022-06-29 (×2): qty 360, 90d supply, fill #0
  Filled 2022-07-01: qty 120, 30d supply, fill #0
  Filled 2022-07-27: qty 120, 30d supply, fill #1
  Filled 2022-08-23: qty 120, 30d supply, fill #2

## 2022-06-28 NOTE — Progress Notes (Signed)
Established Patient Office Visit  Subjective   Patient ID: Allen Fisher, male   DOB: 02/12/1976 Age: 46 y.o. MRN: [id number]   Chief Complaint  Patient presents with   Medication Refill   Weight Check   HPI Pleasant 46 year old male presenting today for the following:  Weight check: Taking Contrave 2 tablets twice daily as prescribed, tolerating well without side effects.  Feels that the medication is working fairly well for him although its not quite at the level that Topamax was before he went on vacation.  He regrets stopping the Topamax while he was on vacation since it did not work the same once he returned.  He is content with the Contrave and would like to continue it.  Admits that his exercise has slowed down just a bit with the recent work/school activities but with the holidays coming up, he will get back to his regular exercises.  Continues to work on healthy dietary intake with portion control.  HTN: Taking valsartan 160 mg daily, tolerating well without side effects.  Has occasionally checked his blood pressures and seeing systolic readings of 120s-130s with diastolic readings of 90s.  Follows a low-sodium diet.  Exercise as above.  Feels good and has no concerning symptoms.  Has been treated with testosterone supplementation chronically for hypergonadism. This has been managed by [facility] but he reports needing a refill.  He has had labs recently and notes that it would be much easier if our office could manage his testosterone replacement.  Admits that he is not the greatest and keeping on track with his testosterone injections.   Objective:    Vitals:   06/28/22 1557 06/28/22 1623  BP: (!) 150/100 (!) 149/93  Pulse: 88 78  Resp: 20 20  Height: 5\' 6"  (1.676 m)   Weight: 196 lb 8 oz (89.1 kg)   SpO2: 96% 98%  BMI (Calculated): 31.73     Physical Exam Vitals reviewed.  Constitutional:      General: He is not in acute distress.    Appearance: Normal  appearance. He is obese. He is not ill-appearing.  HENT:     Head: Normocephalic and atraumatic.  Cardiovascular:     Rate and Rhythm: Normal rate and regular rhythm.     Pulses: Normal pulses.     Heart sounds: Normal heart sounds. No murmur heard.    No friction rub. No gallop.  Pulmonary:     Effort: Pulmonary effort is normal. No respiratory distress.     Breath sounds: Normal breath sounds.  Skin:    General: Skin is warm and dry.  Neurological:     Mental Status: He is alert and oriented to person, place, and time.  Psychiatric:        Mood and Affect: Mood normal.        Behavior: Behavior normal.        Thought Content: Thought content normal.        Judgment: Judgment normal.   No results found for this or any previous visit (from the past 24 hour(s)).     The 10-year ASCVD risk score (Arnett DK, et al., 2019) is: 2.1%   Values used to calculate the score:     Age: 46 years     Sex: Male     Is Non-Hispanic African American: No     Diabetic: No     Tobacco smoker: No     Systolic Blood Pressure: 149 mmHg     Is  BP treated: Yes     HDL Cholesterol: 41 mg/dL     Total Cholesterol: 148 mg/dL   Assessment & Plan:   1. Encounter for weight management His weight is holding steady for now but he has not gained any since starting Contrave.  Continue Contrave 2 tablets twice daily as prescribed.  Continue to work on regular intentional exercise at a heart healthy diet with controlled portions.  2. Essential hypertension Blood pressure continues to be elevated even on recheck today.  Unfortunately he does have multiple issues that could be affecting his blood pressure including suboptimal treatment of his sleep apnea, testosterone use, obesity, and reduced activity level.  For now, increasing valsartan to 320 mg daily.  Continue low-sodium diet.  Increase exercise as tolerated.  Continue with plan for updated sleep study scheduled in January.  3. Hypogonadism  male Requesting labs from [facility].  Refilling testosterone today.  We will be happy to take over this prescription for ease prescribing at the patient's request.  Return in about 2 weeks (around 07/12/2022) for nurse visit for BP check.  ___________________________________________ Thayer Ohm, DNP, APRN, FNP-BC Primary Care and Sports Medicine [facility] [place]

## 2022-06-29 ENCOUNTER — Other Ambulatory Visit (HOSPITAL_COMMUNITY): Payer: Self-pay

## 2022-06-29 NOTE — Progress Notes (Signed)
Records request sent to [facility] for records.

## 2022-07-01 ENCOUNTER — Other Ambulatory Visit (HOSPITAL_COMMUNITY): Payer: Self-pay

## 2022-07-04 ENCOUNTER — Other Ambulatory Visit (HOSPITAL_COMMUNITY): Payer: Self-pay

## 2022-07-04 MED ORDER — TESTOSTERONE CYPIONATE 200 MG/ML IM SOLN: 200.0000 mg | INTRAMUSCULAR | 0 refills | Status: DC

## 2022-07-05 ENCOUNTER — Other Ambulatory Visit (HOSPITAL_COMMUNITY): Payer: Self-pay

## 2022-07-12 ENCOUNTER — Ambulatory Visit: Payer: 59

## 2022-07-12 ENCOUNTER — Ambulatory Visit
Admission: RE | Admit: 2022-07-12 | Discharge: 2022-07-12 | Disposition: A | Discharge status: Home / Self Care | Payer: 59 | Source: Ambulatory Visit | Attending: Family Medicine | Admitting: Family Medicine

## 2022-07-12 ENCOUNTER — Encounter: Payer: Self-pay | Admitting: Medical-Surgical

## 2022-07-12 VITALS — BP 154/105 | HR 87 | Temp 98.9°F | Resp 18 | Ht 66.0 in | Wt 190.0 lb

## 2022-07-12 DIAGNOSIS — J029 Acute pharyngitis, unspecified: Secondary | ICD-10-CM | POA: Diagnosis not present

## 2022-07-12 HISTORY — DX: Essential (primary) hypertension: I10

## 2022-07-12 MED ORDER — AMOXICILLIN 875 MG PO TABS: 875.0000 mg | ORAL_TABLET | Freq: Two times a day (BID) | ORAL | 0 refills | Status: AC

## 2022-07-12 MED ORDER — PREDNISONE 20 MG PO TABS: ORAL_TABLET | ORAL | 0 refills | Status: DC

## 2022-07-12 NOTE — Discharge Instructions (Addendum)
Advised patient to take medications as directed with food to completion.  Advised patient to take prednisone with first dose of amoxicillin for the next 5 of 7 days.  Encouraged increase daily water intake to 64 ounces per day while taking these medications.  The symptoms worsen and/or unresolved please follow-up PCP or here for further evaluation.

## 2022-07-12 NOTE — ED Triage Notes (Addendum)
Sore throat x 24 hours Feels  like he is swallowing "razor blades" Ibuprofen 800mg  at 1200 One of his kids has pink eye - pt started w/ crsty eyes w/ yellow drainage since yesterday  Pt was at the [employer] parade negative home COVID test  Son was tested for COVID/Flu/strep at the [facility] - all negative

## 2022-07-12 NOTE — ED Provider Notes (Signed)
[facility]    CSN: [id number] Arrival date & time: 07/12/22  1250      History   Chief Complaint Chief Complaint  Patient presents with   Sore Throat    APPT 1PM    HPI Allen Fisher is a 46 y.o. male.   HPI 46 year old male presents with sore throat.  Reports feels like he is swallowing razor blades.  PMH significant for morbid obesity, HTN, and cervicogenic headache.  Past Medical History:  Diagnosis Date   Environmental allergies    Frequent sinus infections    Hypertension    Pneumonia     Patient Active Problem List   Diagnosis Date Noted   Essential hypertension 06/28/2022   Cervicogenic headache 04/22/2021   Loud snoring 04/22/2021   Impingement syndrome of right shoulder region 09/04/2017   Status post cholecystectomy 12/13/2016   Seasonal allergic rhinitis 12/13/2016   Elevated blood pressure reading 12/13/2016   Onychomycosis 12/13/2016   Acne vulgaris 07/04/2014   Hypogonadism male 06/05/2014   Gilbert's syndrome 06/05/2014   IBS (irritable bowel syndrome) 06/05/2014   Class 1 obesity due to excess calories with serious comorbidity and body mass index (BMI) of 32.0 to 32.9 in adult 06/05/2014   Elevated LFTs 06/05/2014   Asthma 10/12/2010    Past Surgical History:  Procedure Laterality Date   BACK SURGERY     CHOLECYSTECTOMY     KNEE SURGERY     SHOULDER SURGERY         Home Medications    Prior to Admission medications   Medication Sig Start Date End Date Taking? Authorizing Provider  amoxicillin (AMOXIL) 875 MG tablet Take 1 tablet (875 mg total) by mouth 2 (two) times daily for 7 days. 07/12/22 07/19/22 Yes Trevor Iha, FNP  predniSONE (DELTASONE) 20 MG tablet Take 3 tabs PO daily x 5 days. 07/12/22  Yes Trevor Iha, FNP  AMBULATORY NON FORMULARY MEDICATION Continuous positive airway pressure (CPAP) machine set on AutoPAP (4-20 cmH2O), with all supplemental supplies as needed. 11/17/21   Christen Butter, NP  colesevelam  (WELCHOL) 625 MG tablet TAKE 3 TABLETS BY MOUTH  TWICE DAILY WITH A MEAL 04/29/22   Christen Butter, NP  Naltrexone-buPROPion HCl ER 8-90 MG TB12 Take 2 tablets by mouth 2 (two) times daily. 06/28/22   Christen Butter, NP  testosterone cypionate (DEPOTESTOSTERONE CYPIONATE) 200 MG/ML injection Inject 1 mL (200 mg total) into the muscle every 14 (fourteen) days. 06/28/22   Christen Butter, NP  testosterone cypionate (DEPOTESTOSTERONE CYPIONATE) 200 MG/ML injection Inject 1 mL (200 mg total) into the muscle every 14 (fourteen) days. Patient not taking: Reported on 07/12/2022 07/04/22     valsartan (DIOVAN) 320 MG tablet Take 1 tablet (320 mg total) by mouth daily. 06/28/22   Christen Butter, NP    Family History Family History  Problem Relation Age of Onset   Cancer Mother        uterine   Arrhythmia Mother    Healthy Father     Social History Social History   Tobacco Use   Smoking status: Never   Smokeless tobacco: Current    Types: Snuff  Vaping Use   Vaping Use: Never used  Substance Use Topics   Drug use: No     Allergies   Patient has no known allergies.   Review of Systems Review of Systems  HENT:  Positive for sore throat.      Physical Exam Triage Vital Signs ED Triage Vitals  Enc Vitals  Group     BP 07/12/22 1312 (!) 154/105     Pulse Rate 07/12/22 1312 87     Resp 07/12/22 1312 18     Temp 07/12/22 1312 98.9 F (37.2 C)     Temp Source 07/12/22 1312 Oral     SpO2 07/12/22 1312 98 %     Weight 07/12/22 1315 190 lb (86.2 kg)     Height 07/12/22 1315 5\' 6"  (1.676 m)     Head Circumference --      Peak Flow --      Pain Score 07/12/22 1316 7     Pain Loc --      Pain Edu? --      Excl. in GC? --    No data found.  Updated Vital Signs BP (!) 154/105 (BP Location: Left Arm) Comment: hx of HTN - pt has been taking ibuprofen the last week BID & did not use CPAP while on vacation  Pulse 87   Temp 98.9 F (37.2 C) (Oral)   Resp 18   Ht 5\' 6"  (1.676 m)   Wt 190 lb  (86.2 kg)   SpO2 98%   BMI 30.67 kg/m      Physical Exam Vitals and nursing note reviewed.  Constitutional:      Appearance: He is obese. He is ill-appearing.  HENT:     Head: Normocephalic and atraumatic.     Right Ear: Tympanic membrane and ear canal normal.     Left Ear: Tympanic membrane and ear canal normal.     Mouth/Throat:     Mouth: Mucous membranes are moist.     Pharynx: Oropharynx is clear. Uvula midline. Posterior oropharyngeal erythema and uvula swelling present.     Tonsils: 2+ on the right. 2+ on the left.  Eyes:     Conjunctiva/sclera: Conjunctivae normal.     Pupils: Pupils are equal, round, and reactive to light.  Cardiovascular:     Rate and Rhythm: Normal rate and regular rhythm.     Heart sounds: Normal heart sounds.  Pulmonary:     Effort: Pulmonary effort is normal.     Breath sounds: Normal breath sounds.  Musculoskeletal:     Cervical back: Normal range of motion and neck supple.  Lymphadenopathy:     Cervical: Cervical adenopathy present.  Skin:    General: Skin is warm and dry.  Neurological:     General: No focal deficit present.     Mental Status: He is alert and oriented to person, place, and time.      UC Treatments / Results  Labs (all labs ordered are listed, but only abnormal results are displayed) Labs Reviewed - No data to display  EKG   Radiology No results found.  Procedures Procedures (including critical care time)  Medications Ordered in UC Medications - No data to display  Initial Impression / Assessment and Plan / UC Course  I have reviewed the triage vital signs and the nursing notes.  Pertinent labs & imaging results that were available during my care of the patient were reviewed by me and considered in my medical decision making (see chart for details).     MDM: 1.  Acute pharyngitis-Rx'd Amoxicillin, prednisone. Advised patient to take medications as directed with food to completion.  Advised patient to  take prednisone with first dose of amoxicillin for the next 5 of 7 days.  Encouraged increase daily water intake to 64 ounces per day while taking these medications.  The symptoms worsen and/or unresolved please follow-up PCP or here for further evaluation.  Patient discharged home, hemodynamically stable. Final Clinical Impressions(s) / UC Diagnoses   Final diagnoses:  Acute pharyngitis, unspecified etiology     Discharge Instructions      Advised patient to take medications as directed with food to completion.  Advised patient to take prednisone with first dose of amoxicillin for the next 5 of 7 days.  Encouraged increase daily water intake to 64 ounces per day while taking these medications.  The symptoms worsen and/or unresolved please follow-up PCP or here for further evaluation.     ED Prescriptions     Medication Sig Dispense Auth. Provider   amoxicillin (AMOXIL) 875 MG tablet Take 1 tablet (875 mg total) by mouth 2 (two) times daily for 7 days. 14 tablet Trevor Iha, FNP   predniSONE (DELTASONE) 20 MG tablet Take 3 tabs PO daily x 5 days. 15 tablet Trevor Iha, FNP      PDMP not reviewed this encounter.   Sonu, [place], FNP 07/12/22 1404

## 2022-07-24 ENCOUNTER — Encounter: Payer: Self-pay | Admitting: Medical-Surgical

## 2022-07-24 NOTE — Progress Notes (Signed)
   Established Patient Office Visit  Subjective   Patient ID: Kiaan Overholser, male   DOB: November 01, 1975 Age: 46 y.o. MRN: [id number]   No chief complaint on file.   HPI Pleasant 46 year old male presenting today for the following:  HTN: taking Valsartan 320mg  daily, tolerating well without side effects.   Weight check: taking Contrave 2 tablets BID as prescribed, tolerating well without side effects.    Objective:    There were no vitals filed for this visit.  Physical Exam   No results found for this or any previous visit (from the past 24 hour(s)).   {Labs (Optional):23779}  The 10-year ASCVD risk score (Arnett DK, et al., 2019) is: 2.2%   Values used to calculate the score:     Age: 19 years     Sex: Male     Is Non-Hispanic African American: No     Diabetic: No     Tobacco smoker: No     Systolic Blood Pressure: 154 mmHg     Is BP treated: Yes     HDL Cholesterol: 41 mg/dL     Total Cholesterol: 148 mg/dL   Assessment & Plan:   No problem-specific Assessment & Plan notes found for this encounter.   No follow-ups on file.  ___________________________________________ 61, DNP, APRN, FNP-BC Primary Care and Sports Medicine [facility] [place]

## 2022-07-25 ENCOUNTER — Encounter: Payer: Self-pay | Admitting: Medical-Surgical

## 2022-07-25 ENCOUNTER — Other Ambulatory Visit (HOSPITAL_COMMUNITY): Payer: Self-pay

## 2022-07-25 ENCOUNTER — Ambulatory Visit: Payer: 59 | Admitting: Medical-Surgical

## 2022-07-25 VITALS — BP 136/91 | HR 85 | Resp 20 | Ht 66.0 in | Wt 188.0 lb

## 2022-07-25 DIAGNOSIS — Z6832 Body mass index (BMI) 32.0-32.9, adult: Secondary | ICD-10-CM

## 2022-07-25 DIAGNOSIS — I10 Essential (primary) hypertension: Secondary | ICD-10-CM

## 2022-07-25 DIAGNOSIS — E6609 Other obesity due to excess calories: Secondary | ICD-10-CM

## 2022-07-25 MED ORDER — AMLODIPINE BESYLATE 5 MG PO TABS
5.0000 mg | ORAL_TABLET | Freq: Every day | ORAL | 1 refills | Status: DC
  Filled 2022-07-25: qty 30, 30d supply, fill #0

## 2022-07-28 ENCOUNTER — Other Ambulatory Visit: Payer: Self-pay

## 2022-07-29 ENCOUNTER — Encounter: Payer: Self-pay | Admitting: Medical-Surgical

## 2022-08-09 ENCOUNTER — Ambulatory Visit (INDEPENDENT_AMBULATORY_CARE_PROVIDER_SITE_OTHER): Payer: 59 | Admitting: Medical-Surgical

## 2022-08-09 VITALS — BP 118/78 | HR 91 | Ht 66.0 in | Wt 188.0 lb

## 2022-08-09 DIAGNOSIS — I10 Essential (primary) hypertension: Secondary | ICD-10-CM | POA: Diagnosis not present

## 2022-08-09 NOTE — Progress Notes (Signed)
Patient is here for blood pressure check.   Previous BP was 136/91  1st BP today: 118/78  Denies chest pain, dizziness, shortness of breath, severe headache, or nosebleeds.  Taking medication as prescribed. Denies missed doses.    Pt states per his records, BP is increasing during the later afternoon and evenings.  Provider Larinda Buttery came to speak to patient.

## 2022-08-09 NOTE — Progress Notes (Signed)
Recommend patient take valsartan 320 mg in the morning with amlodipine 5 mg at night to see if this dosing provides better control over his blood pressure.  Agree with documentation as below.   ___________________________________________ Thayer Ohm, DNP, APRN, FNP-BC Primary Care and Sports Medicine [facility] [place]

## 2022-08-15 ENCOUNTER — Ambulatory Visit
Admission: EM | Admit: 2022-08-15 | Discharge: 2022-08-15 | Disposition: A | Discharge status: Home / Self Care | Payer: 59 | Attending: Family Medicine | Admitting: Family Medicine

## 2022-08-15 ENCOUNTER — Encounter: Payer: Self-pay | Admitting: Emergency Medicine

## 2022-08-15 DIAGNOSIS — J40 Bronchitis, not specified as acute or chronic: Secondary | ICD-10-CM | POA: Diagnosis not present

## 2022-08-15 DIAGNOSIS — J069 Acute upper respiratory infection, unspecified: Secondary | ICD-10-CM

## 2022-08-15 DIAGNOSIS — J3089 Other allergic rhinitis: Secondary | ICD-10-CM | POA: Diagnosis not present

## 2022-08-15 MED ORDER — AMOXICILLIN-POT CLAVULANATE 875-125 MG PO TABS: 1.0000 | ORAL_TABLET | Freq: Two times a day (BID) | ORAL | 0 refills | Status: DC

## 2022-08-15 MED ORDER — PREDNISONE 20 MG PO TABS: ORAL_TABLET | ORAL | 0 refills | Status: DC

## 2022-08-15 MED ORDER — PROMETHAZINE-DM 6.25-15 MG/5ML PO SYRP: 5.0000 mL | ORAL_SOLUTION | Freq: Four times a day (QID) | ORAL | 0 refills | Status: DC | PRN

## 2022-08-15 NOTE — ED Provider Notes (Signed)
[facility]    CSN: [id number] Arrival date & time: 08/15/22  1052      History   Chief Complaint Chief Complaint  Patient presents with   Cough    HPI Allen Fisher is a 47 y.o. male.   HPI  Has environmental allergies.  He has frequent sinus infections and he states they often go into pneumonia.  He has a current infection that is been going on for 4 days and is getting worse.  He is starting to have uncontrollable coughing and he is coughing all night long.  He is taking Zyrtec on a daily basis.  He has Flonase but is not taking it daily.  I told him to start the Flonase daily and continue taking it for a month or more to see if this helps prevent another illness.  He has done COVID testing that was negative.  Past Medical History:  Diagnosis Date   Environmental allergies    Frequent sinus infections    Hypertension    Pneumonia    Status post cholecystectomy 12/13/2016    Patient Active Problem List   Diagnosis Date Noted   Essential hypertension 06/28/2022   Cervicogenic headache 04/22/2021   Loud snoring 04/22/2021   Impingement syndrome of right shoulder region 09/04/2017   Seasonal allergic rhinitis 12/13/2016   Onychomycosis 12/13/2016   Acne vulgaris 07/04/2014   Hypogonadism male 06/05/2014   Gilbert's syndrome 06/05/2014   IBS (irritable bowel syndrome) 06/05/2014   Class 1 obesity due to excess calories with serious comorbidity and body mass index (BMI) of 32.0 to 32.9 in adult 06/05/2014   Elevated LFTs 06/05/2014   Asthma 10/12/2010    Past Surgical History:  Procedure Laterality Date   BACK SURGERY     CHOLECYSTECTOMY     KNEE SURGERY     SHOULDER SURGERY         Home Medications    Prior to Admission medications   Medication Sig Start Date End Date Taking? Authorizing Provider  AMBULATORY NON FORMULARY MEDICATION Continuous positive airway pressure (CPAP) machine set on AutoPAP (4-20 cmH2O), with all supplemental supplies as  needed. 11/17/21  Yes Samuel Bouche, NP  amLODipine (NORVASC) 5 MG tablet Take 1 tablet (5 mg total) by mouth daily. 07/25/22  Yes Samuel Bouche, NP  amoxicillin-clavulanate (AUGMENTIN) 875-125 MG tablet Take 1 tablet by mouth every 12 (twelve) hours. 08/15/22  Yes Raylene Everts, MD  colesevelam [facility]) 625 MG tablet TAKE 3 TABLETS BY MOUTH  TWICE DAILY WITH A MEAL 04/29/22  Yes Samuel Bouche, NP  Naltrexone-buPROPion HCl ER 8-90 MG TB12 Take 2 tablets by mouth 2 (two) times daily. 06/28/22  Yes Samuel Bouche, NP  predniSONE (DELTASONE) 20 MG tablet Take 2 a day for 5 days, then reduce to 1 a day for 5 additional days 08/15/22  Yes Raylene Everts, MD  promethazine-dextromethorphan (PROMETHAZINE-DM) 6.25-15 MG/5ML syrup Take 5 mLs by mouth 4 (four) times daily as needed for cough. 08/15/22  Yes Raylene Everts, MD  testosterone cypionate (DEPOTESTOSTERONE CYPIONATE) 200 MG/ML injection Inject 1 mL (200 mg total) into the muscle every 14 (fourteen) days. 06/28/22  Yes Samuel Bouche, NP  valsartan (DIOVAN) 320 MG tablet Take 1 tablet (320 mg total) by mouth daily. 06/28/22  Yes Samuel Bouche, NP    Family History Family History  Problem Relation Age of Onset   Cancer Mother        uterine   Arrhythmia Mother    Healthy Father  Social History Social History   Tobacco Use   Smoking status: Never   Smokeless tobacco: Current    Types: Snuff  Vaping Use   Vaping Use: Never used  Substance Use Topics   Alcohol use: Yes    Comment: occasionally   Drug use: No     Allergies   Patient has no known allergies.   Review of Systems Review of Systems See HPI  Physical Exam Triage Vital Signs ED Triage Vitals  Enc Vitals Group     BP 08/15/22 1104 127/81     Pulse Rate 08/15/22 1104 75     Resp 08/15/22 1104 18     Temp 08/15/22 1104 97.9 F (36.6 C)     Temp Source 08/15/22 1104 Oral     SpO2 08/15/22 1104 96 %     Weight 08/15/22 1106 185 lb (83.9 kg)     Height 08/15/22 1106 5'  6" (1.676 m)     Head Circumference --      Peak Flow --      Pain Score 08/15/22 1106 0     Pain Loc --      Pain Edu? --      Excl. in GC? --    No data found.  Updated Vital Signs BP 127/81 (BP Location: Right Arm)   Pulse 75   Temp 97.9 F (36.6 C) (Oral)   Resp 18   Ht 5\' 6"  (1.676 m)   Wt 83.9 kg   SpO2 96%   BMI 29.86 kg/m      Physical Exam Constitutional:      General: He is not in acute distress.    Appearance: He is well-developed. He is ill-appearing.  HENT:     Head: Normocephalic and atraumatic.     Right Ear: Tympanic membrane and ear canal normal.     Left Ear: Tympanic membrane and ear canal normal.     Nose: Congestion and rhinorrhea present.     Mouth/Throat:     Pharynx: Posterior oropharyngeal erythema present.  Eyes:     Conjunctiva/sclera: Conjunctivae normal.     Pupils: Pupils are equal, round, and reactive to light.  Cardiovascular:     Rate and Rhythm: Normal rate and regular rhythm.     Heart sounds: Normal heart sounds.  Pulmonary:     Effort: Pulmonary effort is normal. No respiratory distress.     Breath sounds: Normal breath sounds.  Abdominal:     General: There is no distension.     Palpations: Abdomen is soft.  Musculoskeletal:        General: Normal range of motion.     Cervical back: Normal range of motion.  Lymphadenopathy:     Cervical: No cervical adenopathy.  Skin:    General: Skin is warm and dry.  Neurological:     General: No focal deficit present.     Mental Status: He is alert.  Psychiatric:        Mood and Affect: Mood normal.      UC Treatments / Results  Labs (all labs ordered are listed, but only abnormal results are displayed) Labs Reviewed - No data to display  EKG   Radiology No results found.  Procedures Procedures (including critical care time)  Medications Ordered in UC Medications - No data to display  Initial Impression / Assessment and Plan / UC Course  I have reviewed the triage  vital signs and the nursing notes.  Pertinent labs &  imaging results that were available during my care of the patient were reviewed by me and considered in my medical decision making (see chart for details).     I tried to talk to this patient about his recurring viral illnesses.  He is of the belief that if he does not "catch it early" it will "go to my chest" to cause pneumonia or bronchitis.  I let him know that the vast majority of bronchitis is viral as well.  I told him to talk to his primary care doctor about his allergies contributing to his recurring illnesses, and whether his immunity is adequate. Final Clinical Impressions(s) / UC Diagnoses   Final diagnoses:  Acute upper respiratory infection  Bronchitis  Environmental and seasonal allergies     Discharge Instructions      Drink lots of fluids Continue your allergy pill daily Add Flonase daily.  Continue through the winter season Take prednisone 2 times a day for 5 days then 1 a day for 5 days Take the antibiotic 2 times a day for a week I have prescribed a stronger cough medicine to take at bedtime See Dr. Charna Archer if you fail to improve     ED Prescriptions     Medication Sig Dispense Auth. Provider   predniSONE (DELTASONE) 20 MG tablet Take 2 a day for 5 days, then reduce to 1 a day for 5 additional days 15 tablet Raylene Everts, MD   amoxicillin-clavulanate (AUGMENTIN) 875-125 MG tablet Take 1 tablet by mouth every 12 (twelve) hours. 14 tablet Raylene Everts, MD   promethazine-dextromethorphan (PROMETHAZINE-DM) 6.25-15 MG/5ML syrup Take 5 mLs by mouth 4 (four) times daily as needed for cough. 118 mL Raylene Everts, MD      PDMP not reviewed this encounter.   Raylene Everts, MD 08/15/22 1254

## 2022-08-15 NOTE — Discharge Instructions (Addendum)
Drink lots of fluids Continue your allergy pill daily Add Flonase daily.  Continue through the winter season Take prednisone 2 times a day for 5 days then 1 a day for 5 days Take the antibiotic 2 times a day for a week I have prescribed a stronger cough medicine to take at bedtime See Dr. Charna Archer if you fail to improve

## 2022-08-15 NOTE — ED Triage Notes (Signed)
Patient c/o dry cough x 4 days that comes and goes for several weeks. Cough worse when lying down at night.  Patient also c/o right ear pain and congestion.  Patient has taken Vicks.

## 2022-08-23 ENCOUNTER — Other Ambulatory Visit (HOSPITAL_COMMUNITY): Payer: Self-pay

## 2022-08-23 MED ORDER — AMLODIPINE BESYLATE 10 MG PO TABS: 10.0000 mg | ORAL_TABLET | Freq: Every day | ORAL | 1 refills | Status: DC

## 2022-08-23 MED ORDER — AMLODIPINE BESYLATE 10 MG PO TABS
10.0000 mg | ORAL_TABLET | Freq: Every day | ORAL | 1 refills | Status: DC
  Filled 2022-08-23: qty 90, 90d supply, fill #0
  Filled 2022-11-25: qty 30, 30d supply, fill #0
  Filled 2022-12-28: qty 30, 30d supply, fill #1

## 2022-08-23 NOTE — Addendum Note (Signed)
Addended by: Narda Rutherford on: 08/23/2022 01:39 PM   Modules accepted: Orders

## 2022-09-22 DIAGNOSIS — G4733 Obstructive sleep apnea (adult) (pediatric): Secondary | ICD-10-CM | POA: Insufficient documentation

## 2022-09-22 DIAGNOSIS — Z6828 Body mass index (BMI) 28.0-28.9, adult: Secondary | ICD-10-CM | POA: Insufficient documentation

## 2022-09-26 ENCOUNTER — Other Ambulatory Visit: Payer: Self-pay | Admitting: Medical-Surgical

## 2022-09-27 ENCOUNTER — Other Ambulatory Visit: Payer: Self-pay

## 2022-09-27 MED ORDER — CONTRAVE 8-90 MG PO TB12
2.0000 | ORAL_TABLET | Freq: Two times a day (BID) | ORAL | 0 refills | Status: DC
  Filled 2022-09-27 – 2022-11-02 (×3): qty 120, 30d supply, fill #0
  Filled 2022-12-28: qty 120, 30d supply, fill #1

## 2022-09-28 ENCOUNTER — Other Ambulatory Visit (HOSPITAL_COMMUNITY): Payer: Self-pay

## 2022-10-04 ENCOUNTER — Other Ambulatory Visit: Payer: Self-pay | Admitting: Otolaryngology

## 2022-10-10 ENCOUNTER — Encounter: Payer: Self-pay | Admitting: Emergency Medicine

## 2022-10-10 ENCOUNTER — Ambulatory Visit: Payer: 59

## 2022-10-10 ENCOUNTER — Ambulatory Visit
Admission: EM | Admit: 2022-10-10 | Discharge: 2022-10-10 | Disposition: A | Discharge status: Home / Self Care | Payer: 59 | Attending: Family Medicine | Admitting: Family Medicine

## 2022-10-10 DIAGNOSIS — J302 Other seasonal allergic rhinitis: Secondary | ICD-10-CM | POA: Diagnosis not present

## 2022-10-10 MED ORDER — PREDNISONE 50 MG PO TABS: ORAL_TABLET | ORAL | 0 refills | Status: DC

## 2022-10-10 MED ORDER — METHYLPREDNISOLONE SODIUM SUCC 125 MG IJ SOLR
80.0000 mg | Freq: Once | INTRAMUSCULAR | Status: AC
  Administered 2022-10-10: 80 mg via INTRAMUSCULAR

## 2022-10-10 NOTE — ED Triage Notes (Signed)
Pt reports constant sneezing, nasal congestion and bilateral eye burning. States his allergies typically flare up but his home medications have not been effective. States he normally takes Flonase and Xyzal in the morning and alternate between Allegra and Claritin at night with no relief. States a steroid shot in the buttocks and a strong dose of prednisone usually works.

## 2022-10-10 NOTE — Discharge Instructions (Signed)
CONTINUE YOUR CURRENT ALLERGY MANAGEMENT

## 2022-10-10 NOTE — ED Provider Notes (Signed)
[facility]    CSN: [id number] Arrival date & time: 10/10/22  1713      History   Chief Complaint Chief Complaint  Patient presents with   Nasal Congestion    HPI [name] is a 47 y.o. male.   HPI  [name] is bothered by seasonal environmental allergies.  Sometimes they hit him suddenly and severely.  As he transitions from the off season to the on season he increases his antihistamines and inhalers.  This year he states he was caught by surprise.  He states he has constant sneezing, nasal congestion, eyes burning, eyes watering, postnasal drip.  He states that prednisone helps him the past when his allergy symptoms are severe  Past Medical History:  Diagnosis Date   Environmental allergies    Frequent sinus infections    Hypertension    Pneumonia    Status post cholecystectomy 12/13/2016    Patient Active Problem List   Diagnosis Date Noted   Essential hypertension 06/28/2022   Cervicogenic headache 04/22/2021   Loud snoring 04/22/2021   Impingement syndrome of right shoulder region 09/04/2017   Seasonal allergic rhinitis 12/13/2016   Onychomycosis 12/13/2016   Acne vulgaris 07/04/2014   Hypogonadism male 06/05/2014   Gilbert's syndrome 06/05/2014   IBS (irritable bowel syndrome) 06/05/2014   Class 1 obesity due to excess calories with serious comorbidity and body mass index (BMI) of 32.0 to 32.9 in adult 06/05/2014   Elevated LFTs 06/05/2014   Asthma 10/12/2010    Past Surgical History:  Procedure Laterality Date   BACK SURGERY     CHOLECYSTECTOMY     KNEE SURGERY     SHOULDER SURGERY         Home Medications    Prior to Admission medications   Medication Sig Start Date End Date Taking? Authorizing Provider  predniSONE (DELTASONE) 50 MG tablet Take 1 daily for 5 days.  Take with food 10/10/22  Yes Raylene Everts, MD  AMBULATORY NON FORMULARY MEDICATION Continuous positive airway pressure (CPAP) machine set on AutoPAP (4-20  cmH2O), with all supplemental supplies as needed. 11/17/21   Samuel Bouche, NP  amLODipine (NORVASC) 10 MG tablet Take 1 tablet (10 mg total) by mouth daily. 08/23/22   Samuel Bouche, NP  colesevelam (WELCHOL) 625 MG tablet TAKE 3 TABLETS BY MOUTH  TWICE DAILY WITH A MEAL 04/29/22   Samuel Bouche, NP  Naltrexone-buPROPion HCl ER (CONTRAVE) 8-90 MG TB12 Take 2 tablets by mouth 2 (two) times daily. 09/27/22   Samuel Bouche, NP  testosterone cypionate (DEPOTESTOSTERONE CYPIONATE) 200 MG/ML injection Inject 1 mL (200 mg total) into the muscle every 14 (fourteen) days. 06/28/22   Samuel Bouche, NP  valsartan (DIOVAN) 320 MG tablet Take 1 tablet (320 mg total) by mouth daily. 06/28/22   Samuel Bouche, NP    Family History Family History  Problem Relation Age of Onset   Cancer Mother        uterine   Arrhythmia Mother    Healthy Father     Social History Social History   Tobacco Use   Smoking status: Never   Smokeless tobacco: Current    Types: Snuff  Vaping Use   Vaping Use: Never used  Substance Use Topics   Alcohol use: Yes    Comment: occasionally   Drug use: No     Allergies   Patient has no known allergies.   Review of Systems Review of Systems See HPI  Physical Exam Triage Vital Signs  ED Triage Vitals  Enc Vitals Group     BP 10/10/22 1736 (!) 146/100     Pulse Rate 10/10/22 1736 82     Resp 10/10/22 1736 17     Temp 10/10/22 1736 98.9 F (37.2 C)     Temp Source 10/10/22 1736 Oral     SpO2 10/10/22 1736 96 %     Weight --      Height --      Head Circumference --      Peak Flow --      Pain Score 10/10/22 1737 0     Pain Loc --      Pain Edu? --      Excl. in [place]? --    No data found.  Updated Vital Signs BP (!) 146/100 (BP Location: Left Arm)   Pulse 82   Temp 98.9 F (37.2 C) (Oral)   Resp 17   SpO2 96%   Visual Acuity Right Eye Distance:   Left Eye Distance:   Bilateral Distance:    Right Eye Near:   Left Eye Near:    Bilateral Near:     Physical  Exam Constitutional:      General: He is not in acute distress.    Appearance: He is well-developed. He is ill-appearing.  HENT:     Head: Normocephalic and atraumatic.     Right Ear: Tympanic membrane and ear canal normal.     Left Ear: Tympanic membrane and ear canal normal.     Nose: Congestion and rhinorrhea present.     Comments: Nasal membranes are red and swollen.  Clear rhinorrhea.  Posterior pharynx has cobblestoning and hyperemia.  Eyes have conjunctival injection.    Mouth/Throat:     Pharynx: Posterior oropharyngeal erythema present.  Eyes:     Conjunctiva/sclera: Conjunctivae normal.     Pupils: Pupils are equal, round, and reactive to light.  Cardiovascular:     Rate and Rhythm: Normal rate and regular rhythm.     Heart sounds: Normal heart sounds.  Pulmonary:     Effort: Pulmonary effort is normal. No respiratory distress.     Breath sounds: Normal breath sounds.  Abdominal:     General: There is no distension.     Palpations: Abdomen is soft.  Musculoskeletal:        General: Normal range of motion.     Cervical back: Normal range of motion.  Skin:    General: Skin is warm and dry.  Neurological:     Mental Status: He is alert.      UC Treatments / Results  Labs (all labs ordered are listed, but only abnormal results are displayed) Labs Reviewed - No data to display  EKG   Radiology No results found.  Procedures Procedures (including critical care time)  Medications Ordered in UC Medications  methylPREDNISolone sodium succinate (SOLU-MEDROL) 125 mg/2 mL injection 80 mg (has no administration in time range)    Initial Impression / Assessment and Plan / UC Course  I have reviewed the triage vital signs and the nursing notes.  Pertinent labs & imaging results that were available during my care of the patient were reviewed by me and considered in my medical decision making (see chart for details).     Final Clinical Impressions(s) / UC  Diagnoses   Final diagnoses:  Seasonal allergies  Seasonal allergic rhinitis, unspecified trigger     Discharge Instructions      CONTINUE YOUR CURRENT ALLERGY MANAGEMENT  ED Prescriptions     Medication Sig Dispense Auth. Provider   predniSONE (DELTASONE) 50 MG tablet Take 1 daily for 5 days.  Take with food 5 tablet Raylene Everts, MD      PDMP not reviewed this encounter.   Raylene Everts, MD 10/10/22 [phone]

## 2022-10-22 ENCOUNTER — Other Ambulatory Visit (HOSPITAL_COMMUNITY): Payer: Self-pay

## 2022-10-28 ENCOUNTER — Encounter: Payer: Self-pay | Admitting: Medical-Surgical

## 2022-10-28 ENCOUNTER — Other Ambulatory Visit (HOSPITAL_COMMUNITY): Payer: Self-pay

## 2022-10-28 ENCOUNTER — Other Ambulatory Visit: Payer: Self-pay

## 2022-10-31 NOTE — Progress Notes (Signed)
PCP - Samuel Bouche, NP Cardiologist -   PPM/ICD - Denies  Chest x-ray - 12/20/2020 EKG - DOS, 11/02/2022 Stress Test -  ECHO -  Cardiac Cath -   Sleep study/sleep apnea/CPAP: Hx sleep study and diagnosis of sleep apnea.  Patient cannot tolerate the CPAP  Non-diabetic  Blood Thinner Instructions: Denies Aspirin Instructions: Denies  ERAS Protcol - Yes, clear fluids until 3 hours before surgery  COVID TEST- Denies  Anesthesia review: No  Patient verbally denies any shortness of breath, fever, cough and chest pain during phone call   -------------  SDW INSTRUCTIONS given:  Your procedure is scheduled on Wednesday November 02, 2022.  Report to [facility] Main Entrance "A" at 0830 A.M., and check in at the Admitting office.  Call this number if you have problems the morning of surgery:  [phone]   Remember:  Do not eat after midnight the night before your surgery  You may drink clear liquids until 0800 the morning of your surgery.   Clear liquids allowed are: Water, Non-Citrus Juices (without pulp), Carbonated Beverages, Clear Tea, Black Coffee Only, and Gatorade   Take these medicines as needed the morning of surgery with A SIP OF WATER  Flonase   As of today, STOP taking any Aspirin (unless otherwise instructed by your surgeon) Aleve, Naproxen, Ibuprofen, Motrin, Advil, Goody's, BC's, all herbal medications, fish oil, and all vitamins.           Do NOT Smoke (Tobacco/Vaping) 24 hours prior to your procedure  If you use a CPAP at night, you may bring all equipment for your overnight stay.   Contacts, glasses, dentures or bridgework may not be worn into surgery.      For patients admitted to the hospital, discharge time will be determined by your treatment team.   Patients discharged the day of surgery will not be allowed to drive home, and someone needs to stay with them for 24 hours.    Special instructions:   [place]- Preparing For Surgery  Before surgery,  you can play an important role. Because skin is not sterile, your skin needs to be as free of germs as possible. You can reduce the number of germs on your skin by washing with CHG (chlorahexidine gluconate) Soap before surgery.  CHG is an antiseptic cleaner which kills germs and bonds with the skin to continue killing germs even after washing.    Oral Hygiene is also important to reduce your risk of infection.  Remember - BRUSH YOUR TEETH THE MORNING OF SURGERY WITH YOUR REGULAR TOOTHPASTE  Please do not use if you have an allergy to CHG or antibacterial soaps. If your skin becomes reddened/irritated stop using the CHG.  Do not shave (including legs and underarms) for at least 48 hours prior to first CHG shower. It is OK to shave your face.  Please follow these instructions carefully.   Shower the NIGHT BEFORE SURGERY and the MORNING OF SURGERY with DIAL Soap.   Pat yourself dry with a CLEAN TOWEL.  Wear CLEAN PAJAMAS to bed the night before surgery  Place CLEAN SHEETS on your bed the night of your first shower and DO NOT SLEEP WITH PETS.   Day of Surgery: Please shower morning of surgery  Wear Clean/Comfortable clothing the morning of surgery Do not apply any deodorants/lotions, colognes or powders You may shave the morning of surgery.  Remember to brush your teeth WITH YOUR REGULAR TOOTHPASTE.  Do not bring valuables with you to  the hospital. [facility] is not responsible for any belongings or valuables.    Questions were answered. Patient verbalized understanding of instructions.

## 2022-11-01 ENCOUNTER — Encounter: Payer: Self-pay | Admitting: Medical-Surgical

## 2022-11-01 ENCOUNTER — Encounter (HOSPITAL_COMMUNITY): Payer: Self-pay | Admitting: Otolaryngology

## 2022-11-01 NOTE — Progress Notes (Signed)
PCP - Samuel Bouche, NP Cardiologist - n/a  Chest x-ray - n/a EKG - DOS Stress Test - n/a ECHO - n/a Cardiac Cath - n/a  ICD Pacemaker/Loop - n/a  Sleep Study -  Yes,  CPAP - does not use CPAP  Diabetes -n/a  Blood Thinner Instructions:  n/a  Aspirin Instructions: n/a  NPO  Anesthesia review: No  STOP now taking any Aspirin (unless otherwise instructed by your surgeon), Aleve, Naproxen, Ibuprofen, Motrin, Advil, Goody's, BC's, all herbal medications, fish oil, and all vitamins.   Coronavirus Screening Do you have any of the following symptoms:  Cough yes/no: No Fever (>100.70F)  yes/no: No Runny nose yes/no: No Sore throat yes/no: No Difficulty breathing/shortness of breath  yes/no: No  Have you traveled in the last 14 days and where? yes/no: No  Patient verbalized understanding of instructions that were given via [place].

## 2022-11-02 ENCOUNTER — Ambulatory Visit (HOSPITAL_COMMUNITY)
Admission: RE | Admit: 2022-11-02 | Discharge: 2022-11-02 | Disposition: A | Discharge status: Home / Self Care | Payer: 59 | Attending: Otolaryngology | Admitting: Otolaryngology

## 2022-11-02 ENCOUNTER — Other Ambulatory Visit: Payer: Self-pay

## 2022-11-02 ENCOUNTER — Ambulatory Visit (HOSPITAL_COMMUNITY): Payer: 59 | Admitting: Anesthesiology

## 2022-11-02 ENCOUNTER — Encounter (HOSPITAL_COMMUNITY)
Admission: RE | Disposition: A | Discharge status: Home / Self Care | Payer: Self-pay | Source: Home / Self Care | Attending: Otolaryngology

## 2022-11-02 ENCOUNTER — Other Ambulatory Visit (HOSPITAL_COMMUNITY): Payer: Self-pay

## 2022-11-02 ENCOUNTER — Telehealth: Payer: Self-pay

## 2022-11-02 ENCOUNTER — Ambulatory Visit (HOSPITAL_BASED_OUTPATIENT_CLINIC_OR_DEPARTMENT_OTHER): Payer: 59 | Admitting: Anesthesiology

## 2022-11-02 ENCOUNTER — Encounter (HOSPITAL_COMMUNITY): Payer: Self-pay | Admitting: Otolaryngology

## 2022-11-02 DIAGNOSIS — M1990 Unspecified osteoarthritis, unspecified site: Secondary | ICD-10-CM

## 2022-11-02 DIAGNOSIS — I10 Essential (primary) hypertension: Secondary | ICD-10-CM | POA: Diagnosis not present

## 2022-11-02 DIAGNOSIS — Z79899 Other long term (current) drug therapy: Secondary | ICD-10-CM | POA: Diagnosis not present

## 2022-11-02 DIAGNOSIS — G4733 Obstructive sleep apnea (adult) (pediatric): Secondary | ICD-10-CM

## 2022-11-02 DIAGNOSIS — J45909 Unspecified asthma, uncomplicated: Secondary | ICD-10-CM

## 2022-11-02 HISTORY — DX: Personal history of urinary calculi: Z87.442

## 2022-11-02 HISTORY — PX: DRUG INDUCED ENDOSCOPY: SHX6808

## 2022-11-02 HISTORY — DX: Hyperlipidemia, unspecified: E78.5

## 2022-11-02 HISTORY — DX: Sleep apnea, unspecified: G47.30

## 2022-11-02 SURGERY — DRUG INDUCED SLEEP ENDOSCOPY
Anesthesia: Monitor Anesthesia Care | Laterality: Bilateral

## 2022-11-02 MED ORDER — OXYMETAZOLINE HCL 0.05 % NA SOLN
NASAL | Status: AC
  Filled 2022-11-02: qty 30

## 2022-11-02 MED ORDER — PROPOFOL 500 MG/50ML IV EMUL
INTRAVENOUS | Status: DC | PRN
  Administered 2022-11-02: 100 ug/kg/min via INTRAVENOUS

## 2022-11-02 MED ORDER — OXYMETAZOLINE HCL 0.05 % NA SOLN
NASAL | Status: DC | PRN
  Administered 2022-11-02: 1 via TOPICAL

## 2022-11-02 MED ORDER — ORAL CARE MOUTH RINSE: 15.0000 mL | Freq: Once | OROMUCOSAL | Status: AC

## 2022-11-02 MED ORDER — PROPOFOL 10 MG/ML IV BOLUS
INTRAVENOUS | Status: DC | PRN
  Administered 2022-11-02: 70 mg via INTRAVENOUS
  Administered 2022-11-02: 30 mg via INTRAVENOUS
  Administered 2022-11-02: 10 mg via INTRAVENOUS

## 2022-11-02 MED ORDER — LACTATED RINGERS IV SOLN: INTRAVENOUS | Status: DC

## 2022-11-02 MED ORDER — PROMETHAZINE HCL 25 MG/ML IJ SOLN: 6.2500 mg | INTRAMUSCULAR | Status: DC | PRN

## 2022-11-02 MED ORDER — CHLORHEXIDINE GLUCONATE 0.12 % MT SOLN
15.0000 mL | Freq: Once | OROMUCOSAL | Status: AC
  Administered 2022-11-02: 15 mL via OROMUCOSAL
  Filled 2022-11-02: qty 15

## 2022-11-02 SURGICAL SUPPLY — 13 items
BAG COUNTER SPONGE SURGICOUNT (BAG) IMPLANT
BAG SPNG CNTER NS LX DISP (BAG)
CANISTER SUCT 1200ML W/VALVE (MISCELLANEOUS) ×1 IMPLANT
GLOVE BIO SURGEON STRL SZ7.5 (GLOVE) ×1 IMPLANT
KIT BASIN OR (CUSTOM PROCEDURE TRAY) ×1 IMPLANT
NDL PRECISIONGLIDE 27X1.5 (NEEDLE) IMPLANT
NEEDLE PRECISIONGLIDE 27X1.5 (NEEDLE) IMPLANT
PATTIES SURGICAL .5 X3 (DISPOSABLE) ×1 IMPLANT
SHEET MEDIUM DRAPE 40X70 STRL (DRAPES) IMPLANT
SOL ANTI FOG 6CC (MISCELLANEOUS) ×1 IMPLANT
SYR CONTROL 10ML LL (SYRINGE) IMPLANT
TOWEL GREEN STERILE FF (TOWEL DISPOSABLE) ×1 IMPLANT
TUBE CONNECTING 20X1/4 (TUBING) ×1 IMPLANT

## 2022-11-02 NOTE — Transfer of Care (Signed)
Immediate Anesthesia Transfer of Care Note  Patient: Allen Fisher  Procedure(s) Performed: DRUG INDUCED ENDOSCOPY (Bilateral)  Patient Location: PACU  Anesthesia Type:MAC  Level of Consciousness: awake, alert , oriented, and patient cooperative  Airway & Oxygen Therapy: Patient Spontanous Breathing and Patient connected to nasal cannula oxygen  Post-op Assessment: Report given to RN, Post -op Vital signs reviewed and stable, and Patient moving all extremities X 4  Post vital signs: Reviewed and stable  Last Vitals:  Vitals Value Taken Time  BP    Temp    Pulse 67 11/02/22 1416  Resp    SpO2 99 % 11/02/22 1416  Vitals shown include unvalidated device data.  Last Pain:  Vitals:   11/02/22 1205  TempSrc:   PainSc: 0-No pain         Complications: No notable events documented.

## 2022-11-02 NOTE — Op Note (Signed)
Preop diagnosis: Obstructive sleep apnea Postop diagnosis: same Procedure: Drug-induced sleep endoscopy Surgeon: Redmond Baseman Anesth: IV sedation Compl: None Findings: There is circumferential collapse at the velum disqualifying him from candidacy for the hypoglossal nerve stimulator.  There was anterior-posterior collapse at the tongue base. Description:  After discussing risks, benefits, and alternatives, the patient was brought to the operative suite and placed on the operative table in the supine position.  Anesthesia was induced and the patient was given light sedation to simulate natural sleep. When the proper level was reached, an Afrin-soaked pledget was placed in the right nasal passage for a couple of minutes and then removed.  The fiberoptic laryngoscope was then passed to view the pharynx and larynx.  Findings are noted above and the exam was recorded.  After completion, the scope was removed and the patient was returned to anesthesia for wakeup and was moved to the recovery room in stable condition.

## 2022-11-02 NOTE — Anesthesia Procedure Notes (Addendum)
Date/Time: 11/02/2022 1:52 PM  Performed by: Michele Rockers, CRNAPre-anesthesia Checklist: Patient identified, Emergency Drugs available, Suction available, Timeout performed and Patient being monitored Patient Re-evaluated:Patient Re-evaluated prior to induction Oxygen Delivery Method: Nasal Cannula

## 2022-11-02 NOTE — Anesthesia Preprocedure Evaluation (Addendum)
Anesthesia Evaluation  Patient identified by MRN, date of birth, ID band Patient awake    Reviewed: Allergy & Precautions, NPO status , Patient's Chart, lab work & pertinent test results  History of Anesthesia Complications Negative for: history of anesthetic complications  Airway Mallampati: II  TM Distance: >3 FB Neck ROM: Full    Dental  (+) Dental Advisory Given, Teeth Intact   Pulmonary asthma , sleep apnea    Pulmonary exam normal        Cardiovascular hypertension, Pt. on medications Normal cardiovascular exam     Neuro/Psych  Headaches  negative psych ROS   GI/Hepatic negative GI ROS, Neg liver ROS,,,  Endo/Other   Obesity   Renal/GU negative Renal ROS     Musculoskeletal  (+) Arthritis ,    Abdominal   Peds  Hematology negative hematology ROS (+)   Anesthesia Other Findings   Reproductive/Obstetrics                             Anesthesia Physical Anesthesia Plan  ASA: 2  Anesthesia Plan: MAC   Post-op Pain Management: Minimal or no pain anticipated   Induction:   PONV Risk Score and Plan: 1 and Propofol infusion and Treatment may vary due to age or medical condition  Airway Management Planned: Nasal Cannula and Natural Airway  Additional Equipment: None  Intra-op Plan:   Post-operative Plan:   Informed Consent: I have reviewed the patients History and Physical, chart, labs and discussed the procedure including the risks, benefits and alternatives for the proposed anesthesia with the patient or authorized representative who has indicated his/her understanding and acceptance.       Plan Discussed with: CRNA and Anesthesiologist  Anesthesia Plan Comments:        Anesthesia Quick Evaluation

## 2022-11-02 NOTE — Telephone Encounter (Signed)
Initiated Prior authorization [id number] 8-90MG  er tablets Via: Covermymeds Case/Key:[id number]  Status: approved  as of 11/02/22 Reason:approved through 11/01/2023 Notified Pt via: Mychart, called pt with update pt acknowleged and agrees

## 2022-11-02 NOTE — H&P (Signed)
Allen Fisher is an 47 y.o. male.   Chief Complaint: Sleep apnea HPI: 47 year old male with obstructive sleep apnea who has been unable to tolerate CPAP.  Past Medical History:  Diagnosis Date   Environmental allergies    Frequent sinus infections    History of kidney stones    HLD (hyperlipidemia)    Hypertension    Pneumonia    Sleep apnea    does not use cpap   Status post cholecystectomy 12/13/2016    Past Surgical History:  Procedure Laterality Date   BACK SURGERY     CHOLECYSTECTOMY     COLONOSCOPY     KNEE SURGERY Bilateral    x 2   SHOULDER SURGERY Bilateral    x 3 - on right side surg x 2 and 1 on left side   WISDOM TOOTH EXTRACTION      Family History  Problem Relation Age of Onset   Cancer Mother        uterine   Arrhythmia Mother    Healthy Father    Social History:  reports that he has never smoked. His smokeless tobacco use includes snuff. He reports current alcohol use. He reports that he does not use drugs.  Allergies: No Known Allergies  Medications Prior to Admission  Medication Sig Dispense Refill   colesevelam (WELCHOL) 625 MG tablet TAKE 3 TABLETS BY MOUTH  TWICE DAILY WITH A MEAL (Patient taking differently: Take 625 mg by mouth daily with breakfast.) 540 tablet 3   fluticasone (FLONASE) 50 MCG/ACT nasal spray Place 1 spray into both nostrils daily as needed for allergies.     valsartan (DIOVAN) 320 MG tablet Take 1 tablet (320 mg total) by mouth daily. 90 tablet 1   AMBULATORY NON FORMULARY MEDICATION Continuous positive airway pressure (CPAP) machine set on AutoPAP (4-20 cmH2O), with all supplemental supplies as needed. (Patient not taking: Reported on 10/31/2022) 1 each 0   amLODipine (NORVASC) 10 MG tablet Take 1 tablet (10 mg total) by mouth daily. (Patient not taking: Reported on 10/31/2022) 90 tablet 1   Naltrexone-buPROPion HCl ER (CONTRAVE) 8-90 MG TB12 Take 2 tablets by mouth 2 (two) times daily. (Patient not taking: Reported on 10/31/2022)  360 tablet 0   testosterone cypionate (DEPOTESTOSTERONE CYPIONATE) 200 MG/ML injection Inject 1 mL (200 mg total) into the muscle every 14 (fourteen) days. (Patient not taking: Reported on 10/31/2022) 10 mL 3    No results found for this or any previous visit (from the past 48 hour(s)). No results found.  Review of Systems  All other systems reviewed and are negative.   Blood pressure (!) 145/86, pulse 74, temperature 97.8 F (36.6 C), temperature source Oral, resp. rate 18, height 5\' 6"  (1.676 m), weight 86.2 kg, SpO2 96 %. Physical Exam Constitutional:      Appearance: Normal appearance.  HENT:     Head: Normocephalic and atraumatic.     Right Ear: External ear normal.     Left Ear: External ear normal.     Nose: Nose normal.     Mouth/Throat:     Mouth: Mucous membranes are moist.     Pharynx: Oropharynx is clear.  Eyes:     Extraocular Movements: Extraocular movements intact.     Conjunctiva/sclera: Conjunctivae normal.     Pupils: Pupils are equal, round, and reactive to light.  Cardiovascular:     Rate and Rhythm: Normal rate.  Pulmonary:     Effort: Pulmonary effort is normal.  Musculoskeletal:  Cervical back: Normal range of motion.  Skin:    General: Skin is warm and dry.  Neurological:     General: No focal deficit present.     Mental Status: He is alert and oriented to person, place, and time.  Psychiatric:        Mood and Affect: Mood normal.        Behavior: Behavior normal.        Thought Content: Thought content normal.        Judgment: Judgment normal.      Assessment/Plan Obstructive sleep apnea and BMI 30.67  To OR for sleep endoscopy.  Melida Quitter, MD 11/02/2022, 1:37 PM

## 2022-11-02 NOTE — Brief Op Note (Signed)
11/02/2022  2:06 PM  PATIENT:  Allen Fisher  47 y.o. male  PRE-OPERATIVE DIAGNOSIS:  BMI 28.0-28.9,adult Obstructive Sleep Apnea  POST-OPERATIVE DIAGNOSIS:  BMI 28.0-28.9,adultObstructive Sleep Apnea  PROCEDURE:  Procedure(s): DRUG INDUCED ENDOSCOPY (Bilateral)  SURGEON:  Surgeon(s) and Role:    Melida Quitter, MD - Primary  PHYSICIAN ASSISTANT:   ASSISTANTS: none   ANESTHESIA:   IV sedation  EBL:  0 mL   BLOOD ADMINISTERED:none  DRAINS: none   LOCAL MEDICATIONS USED:  NONE  SPECIMEN:  No Specimen  DISPOSITION OF SPECIMEN:  N/A  COUNTS:  YES  TOURNIQUET:  * No tourniquets in log *  DICTATION: .Note written in EPIC  PLAN OF CARE: Discharge to home after PACU  PATIENT DISPOSITION:  PACU - hemodynamically stable.   Delay start of Pharmacological VTE agent (>24hrs) due to surgical blood loss or risk of bleeding: no

## 2022-11-02 NOTE — Anesthesia Postprocedure Evaluation (Signed)
Anesthesia Post Note  Patient: Allen Fisher  Procedure(s) Performed: DRUG INDUCED ENDOSCOPY (Bilateral)     Patient location during evaluation: PACU Anesthesia Type: MAC Level of consciousness: awake and alert Pain management: pain level controlled Vital Signs Assessment: post-procedure vital signs reviewed and stable Respiratory status: spontaneous breathing, nonlabored ventilation and respiratory function stable Cardiovascular status: stable and blood pressure returned to baseline Anesthetic complications: no   No notable events documented.  Last Vitals:  Vitals:   11/02/22 1139 11/02/22 1415  BP: (!) 145/86   Pulse: 74   Resp: 18   Temp: 36.6 C 36.6 C  SpO2: 96%     Last Pain:  Vitals:   11/02/22 1205  TempSrc:   PainSc: 0-No pain                 Audry Pili

## 2022-11-03 ENCOUNTER — Encounter (HOSPITAL_COMMUNITY): Payer: Self-pay | Admitting: Otolaryngology

## 2022-11-03 LAB — POCT I-STAT, CHEM 8
BUN: 9 mg/dL (ref 6–20)
Calcium, Ion: 1.22 mmol/L (ref 1.15–1.40)
Chloride: 110 mmol/L (ref 98–111)
Creatinine, Ser: 0.9 mg/dL (ref 0.61–1.24)
Glucose, Bld: 84 mg/dL (ref 70–99)
HCT: 42 % (ref 39.0–52.0)
Hemoglobin: 14.3 g/dL (ref 13.0–17.0)
Potassium: 4.1 mmol/L (ref 3.5–5.1)
Sodium: 143 mmol/L (ref 135–145)
TCO2: 24 mmol/L (ref 22–32)

## 2022-11-04 ENCOUNTER — Other Ambulatory Visit (HOSPITAL_COMMUNITY): Payer: Self-pay

## 2022-11-25 ENCOUNTER — Other Ambulatory Visit: Payer: Self-pay

## 2022-11-25 ENCOUNTER — Other Ambulatory Visit (HOSPITAL_COMMUNITY): Payer: Self-pay

## 2022-12-28 ENCOUNTER — Other Ambulatory Visit: Payer: Self-pay

## 2022-12-28 ENCOUNTER — Other Ambulatory Visit: Payer: Self-pay | Admitting: Medical-Surgical

## 2022-12-28 ENCOUNTER — Other Ambulatory Visit (HOSPITAL_COMMUNITY): Payer: Self-pay

## 2022-12-28 MED ORDER — VALSARTAN 320 MG PO TABS
320.0000 mg | ORAL_TABLET | Freq: Every day | ORAL | 0 refills | Status: DC
  Filled 2022-12-28: qty 30, 30d supply, fill #0

## 2022-12-29 ENCOUNTER — Other Ambulatory Visit (HOSPITAL_COMMUNITY): Payer: Self-pay

## 2022-12-29 MED ORDER — TESTOSTERONE CYPIONATE 200 MG/ML IM SOLN
200.0000 mg | INTRAMUSCULAR | 0 refills | Status: DC
  Filled 2022-12-29 – 2023-02-28 (×2): qty 2, 28d supply, fill #0

## 2022-12-29 MED ORDER — FLUTICASONE PROPIONATE 50 MCG/ACT NA SUSP
1.0000 | Freq: Every day | NASAL | 2 refills | Status: DC | PRN
  Filled 2022-12-29: qty 16, 30d supply, fill #0

## 2022-12-29 NOTE — Telephone Encounter (Signed)
Patient due to have testosterone rechecked. 1 month supply sent to pharmacy. Ok to  have him do a lab draw only for the testosterone, CBC.

## 2023-01-06 ENCOUNTER — Other Ambulatory Visit (HOSPITAL_COMMUNITY): Payer: Self-pay

## 2023-01-17 ENCOUNTER — Encounter: Payer: Self-pay | Admitting: Medical-Surgical

## 2023-01-17 ENCOUNTER — Other Ambulatory Visit (HOSPITAL_COMMUNITY): Payer: Self-pay

## 2023-01-17 ENCOUNTER — Ambulatory Visit: Payer: 59 | Admitting: Medical-Surgical

## 2023-01-17 VITALS — BP 126/86 | HR 82 | Resp 20 | Ht 66.0 in | Wt 198.2 lb

## 2023-01-17 DIAGNOSIS — I10 Essential (primary) hypertension: Secondary | ICD-10-CM

## 2023-01-17 MED ORDER — HYDROCHLOROTHIAZIDE 25 MG PO TABS
25.0000 mg | ORAL_TABLET | Freq: Every day | ORAL | 3 refills | Status: DC
  Filled 2023-01-17: qty 30, 30d supply, fill #0
  Filled 2023-02-28: qty 30, 30d supply, fill #1
  Filled 2023-04-03: qty 30, 30d supply, fill #2
  Filled 2023-05-05: qty 30, 30d supply, fill #3
  Filled 2023-06-20: qty 30, 30d supply, fill #4

## 2023-01-17 MED ORDER — AMLODIPINE BESYLATE-VALSARTAN 10-320 MG PO TABS
1.0000 | ORAL_TABLET | Freq: Every day | ORAL | 1 refills | Status: DC
  Filled 2023-01-17: qty 30, 30d supply, fill #0
  Filled 2023-02-28: qty 30, 30d supply, fill #1
  Filled 2023-04-03: qty 30, 30d supply, fill #2
  Filled 2023-05-05: qty 30, 30d supply, fill #3
  Filled 2023-06-20: qty 30, 30d supply, fill #4

## 2023-01-17 NOTE — Progress Notes (Signed)
        Established patient visit  History, exam, impression, and plan:  1. Essential hypertension Pleasant 47 year old male presenting today for follow-up on hypertension.  Taking valsartan 320 mg daily and amlodipine 10 mg nightly, tolerating well without side effects.  Monitoring blood pressure at home and notes that most often his readings are taken in the afternoons or evenings and are above goal.  Notes systolics in the 140s-150s with diastolics most often in the 90s.  Has been working to follow a low-sodium diet however he does still eat a lot of processed foods.  Underwent sleep study and evaluation for the inspire device however he is not a candidate for this for better management of sleep apnea.  Continues to work on regular intentional exercise.  Denies concerning symptoms.  Cardiopulmonary exam normal.  Checking labs as below.  Blood pressure is at goal systolically today however diastolic is still mildly elevated.  With multiple readings over the last weeks to months being elevated, making some changes.  Sending in amlodipine-valsartan 10-320 mg daily to consolidate pills.  Adding hydrochlorothiazide 25 mg daily.  Continue to monitor blood pressure with a goal of 130/80 or less.  Continue to work on limiting processed foods and high sodium options.  Continue regular intentional exercise.  Wear CPAP as much as tolerated to manage sleep apnea. - CBC with Differential/Platelet - COMPLETE METABOLIC PANEL WITH GFR - Lipid panel  Procedures performed this visit: None.  Return in about 2 weeks (around 01/31/2023) for nurse visit for BP check.  __________________________________ Thayer Ohm, DNP, APRN, FNP-BC Primary Care and Sports Medicine [facility] [place]

## 2023-01-18 LAB — CBC WITH DIFFERENTIAL/PLATELET
Absolute Monocytes: 601 cells/uL (ref 200–950)
Basophils Absolute: 53 cells/uL (ref 0–200)
Basophils Relative: 0.8 %
Eosinophils Absolute: 330 cells/uL (ref 15–500)
Eosinophils Relative: 5 %
HCT: 48.4 % (ref 38.5–50.0)
Hemoglobin: 15.9 g/dL (ref 13.2–17.1)
Lymphs Abs: 2053 cells/uL (ref 850–3900)
MCH: 28.6 pg (ref 27.0–33.0)
MCHC: 32.9 g/dL (ref 32.0–36.0)
MCV: 87.1 fL (ref 80.0–100.0)
MPV: 12.9 fL — ABNORMAL HIGH (ref 7.5–12.5)
Monocytes Relative: 9.1 %
Neutro Abs: 3564 cells/uL (ref 1500–7800)
Neutrophils Relative %: 54 %
Platelets: 184 10*3/uL (ref 140–400)
RBC: 5.56 10*6/uL (ref 4.20–5.80)
RDW: 12.9 % (ref 11.0–15.0)
Total Lymphocyte: 31.1 %
WBC: 6.6 10*3/uL (ref 3.8–10.8)

## 2023-01-18 LAB — COMPLETE METABOLIC PANEL WITH GFR
AG Ratio: 1.8 (calc) (ref 1.0–2.5)
ALT: 28 U/L (ref 9–46)
AST: 19 U/L (ref 10–40)
Albumin: 4.2 g/dL (ref 3.6–5.1)
Alkaline phosphatase (APISO): 46 U/L (ref 36–130)
BUN: 11 mg/dL (ref 7–25)
CO2: 25 mmol/L (ref 20–32)
Calcium: 9.4 mg/dL (ref 8.6–10.3)
Chloride: 107 mmol/L (ref 98–110)
Creat: 1.06 mg/dL (ref 0.60–1.29)
Globulin: 2.3 g/dL (calc) (ref 1.9–3.7)
Glucose, Bld: 87 mg/dL (ref 65–99)
Potassium: 4.3 mmol/L (ref 3.5–5.3)
Sodium: 139 mmol/L (ref 135–146)
Total Bilirubin: 1.2 mg/dL (ref 0.2–1.2)
Total Protein: 6.5 g/dL (ref 6.1–8.1)
eGFR: 88 mL/min/{1.73_m2} (ref >=60)

## 2023-01-18 LAB — LIPID PANEL
Cholesterol: 139 mg/dL (ref <200)
HDL: 43 mg/dL (ref >=40)
LDL Cholesterol (Calc): 76 mg/dL (calc)
Non-HDL Cholesterol (Calc): 96 mg/dL (calc) (ref <130)
Total CHOL/HDL Ratio: 3.2 (calc) (ref <5.0)
Triglycerides: 110 mg/dL (ref <150)

## 2023-01-27 ENCOUNTER — Ambulatory Visit: Payer: 59

## 2023-02-01 ENCOUNTER — Ambulatory Visit: Payer: 59

## 2023-02-03 ENCOUNTER — Encounter: Payer: Self-pay | Admitting: Medical-Surgical

## 2023-02-03 ENCOUNTER — Ambulatory Visit: Payer: 59

## 2023-02-03 DIAGNOSIS — I10 Essential (primary) hypertension: Secondary | ICD-10-CM

## 2023-02-03 NOTE — Telephone Encounter (Signed)
I spent 5 total minutes of online digital evaluation and management services in this patient-initiated request for online care. 

## 2023-02-27 ENCOUNTER — Ambulatory Visit: Payer: 59

## 2023-02-28 ENCOUNTER — Other Ambulatory Visit (HOSPITAL_COMMUNITY): Payer: Self-pay

## 2023-04-14 ENCOUNTER — Other Ambulatory Visit (HOSPITAL_COMMUNITY): Payer: Self-pay

## 2023-04-14 ENCOUNTER — Ambulatory Visit: Payer: 59 | Admitting: Podiatry

## 2023-04-14 ENCOUNTER — Ambulatory Visit: Payer: 59

## 2023-04-14 ENCOUNTER — Other Ambulatory Visit: Payer: Self-pay

## 2023-04-14 DIAGNOSIS — M79672 Pain in left foot: Secondary | ICD-10-CM | POA: Diagnosis not present

## 2023-04-14 DIAGNOSIS — M722 Plantar fascial fibromatosis: Secondary | ICD-10-CM

## 2023-04-14 MED ORDER — DEXAMETHASONE SODIUM PHOSPHATE 120 MG/30ML IJ SOLN
4.0000 mg | Freq: Once | INTRAMUSCULAR | Status: AC
Start: 2023-04-14 — End: 2023-04-14
  Administered 2023-04-14: 4 mg via INTRA_ARTICULAR

## 2023-04-14 MED ORDER — TRIAMCINOLONE ACETONIDE 10 MG/ML IJ SUSP
2.5000 mg | Freq: Once | INTRAMUSCULAR | Status: AC
Start: 2023-04-14 — End: 2023-04-14
  Administered 2023-04-14: 2.5 mg via INTRA_ARTICULAR

## 2023-04-14 MED ORDER — MELOXICAM 15 MG PO TABS
15.0000 mg | ORAL_TABLET | Freq: Every day | ORAL | 0 refills | Status: DC
  Filled 2023-04-14: qty 30, 30d supply, fill #0

## 2023-04-14 NOTE — Progress Notes (Signed)
  Subjective:  Patient ID: Allen Fisher, male    DOB: 01/15/1976,   MRN: [id number]  Chief Complaint  Patient presents with   Plantar Fasciitis    Pt present today for heel pain in his left heel that has been going on for 3 months.     47 y.o. male presents for concern of left heel pain that haws been going on for about three months. Realtes first steps in the morning are painful. Relates he has had it off and on for a couple years and has been able to wear stretch and support and do well but recently not been helping. He doesn't take many medications. He has tried TENS units and massage as well.   . Denies any other pedal complaints. Denies n/v/f/c.   Past Medical History:  Diagnosis Date   Environmental allergies    Frequent sinus infections    History of kidney stones    HLD (hyperlipidemia)    Hypertension    Pneumonia    Sleep apnea    does not use cpap   Status post cholecystectomy 12/13/2016    Objective:  Physical Exam: Vascular: DP/PT pulses 2/4 bilateral. CFT <3 seconds. Normal hair growth on digits. No edema.  Skin. No lacerations or abrasions bilateral feet.  Musculoskeletal: MMT 5/5 bilateral lower extremities in DF, PF, Inversion and Eversion. Deceased ROM in DF of ankle joint. Tender to medial calcaneal tubercle on the left. No pain along achilles, PT or arch. No pain with calcaneal squeeze.  Neurological: Sensation intact to light touch.   Assessment:   1. Plantar fasciitis, left      Plan:  Patient was evaluated and treated and all questions answered. Discussed plantar fasciitis with patient.  X-rays reviewed and discussed with patient. No acute fractures or dislocations noted. Mild spurring noted at inferior calcaneus.  Discussed treatment options including, ice, NSAIDS, supportive shoes, bracing, and stretching. Stretching exercises provided to be done on a daily basis.   As patient has been avidly stretching will send to PT Prescription for meloxicam  provided and sent to pharmacy. Recent CMP Cr and BUN normal range.  Patient requesting injection today. Procedure note below.   PF brace dispensed.  Follow-up 6 weeks or sooner if any problems arise. In the meantime, encouraged to call the office with any questions, concerns, change in symptoms.   Procedure:  Discussed etiology, pathology, conservative vs. surgical therapies. At this time a plantar fascial injection was recommended.  The patient agreed and a sterile skin prep was applied.  An injection consisting of  1cc dexamethasone 0.5 cc kenalog and 1cc marcaine mixture was infiltrated at the point of maximal tenderness on the left Heel.  Bandaid applied. The patient tolerated this well and was given instructions for aftercare.    Louann Sjogren, DPM

## 2023-04-14 NOTE — Patient Instructions (Signed)

## 2023-04-15 ENCOUNTER — Encounter: Payer: Self-pay | Admitting: Podiatry

## 2023-04-18 ENCOUNTER — Other Ambulatory Visit: Payer: Self-pay | Admitting: Podiatry

## 2023-04-18 ENCOUNTER — Other Ambulatory Visit (HOSPITAL_COMMUNITY): Payer: Self-pay

## 2023-04-18 MED ORDER — KETOCONAZOLE 2 % EX CREA
1.0000 | TOPICAL_CREAM | Freq: Every day | CUTANEOUS | 2 refills | Status: DC
  Filled 2023-04-18: qty 30, 30d supply, fill #0

## 2023-04-25 ENCOUNTER — Other Ambulatory Visit (HOSPITAL_COMMUNITY): Payer: Self-pay

## 2023-04-26 ENCOUNTER — Ambulatory Visit: Payer: 59 | Admitting: Physical Therapy

## 2023-05-10 ENCOUNTER — Other Ambulatory Visit (HOSPITAL_COMMUNITY): Payer: Self-pay

## 2023-05-15 NOTE — Therapy (Unsigned)
OUTPATIENT PHYSICAL THERAPY LOWER EXTREMITY EVALUATION   Patient Name: Allen Fisher MRN: [id number] DOB:07/14/1976, 47 y.o., male Today's Date: 05/15/2023  END OF SESSION:   Past Medical History:  Diagnosis Date   Environmental allergies    Frequent sinus infections    History of kidney stones    HLD (hyperlipidemia)    Hypertension    Pneumonia    Sleep apnea    does not use cpap   Status post cholecystectomy 12/13/2016   Past Surgical History:  Procedure Laterality Date   BACK SURGERY     CHOLECYSTECTOMY     COLONOSCOPY     DRUG INDUCED ENDOSCOPY Bilateral 11/02/2022   Procedure: DRUG INDUCED ENDOSCOPY;  Surgeon: Christia Reading, MD;  Location: [facility] OR;  Service: ENT;  Laterality: Bilateral;   KNEE SURGERY Bilateral    x 2   SHOULDER SURGERY Bilateral    x 3 - on right side surg x 2 and 1 on left side   WISDOM TOOTH EXTRACTION     Patient Active Problem List   Diagnosis Date Noted   BMI 28.0-28.9,adult 09/22/2022   OSA (obstructive sleep apnea) 09/22/2022   Essential hypertension 06/28/2022   Cervicogenic headache 04/22/2021   Loud snoring 04/22/2021   Impingement syndrome of right shoulder region 09/04/2017   Seasonal allergic rhinitis 12/13/2016   Onychomycosis 12/13/2016   Acne vulgaris 07/04/2014   Hypogonadism male 06/05/2014   Gilbert's syndrome 06/05/2014   IBS (irritable bowel syndrome) 06/05/2014   Class 1 obesity due to excess calories with serious comorbidity and body mass index (BMI) of 32.0 to 32.9 in adult 06/05/2014   Elevated LFTs 06/05/2014   Asthma 10/12/2010    PCP: Christen Butter, NP  REFERRING PROVIDER: Dr Louann Sjogren  REFERRING DIAG: L plantar fasciitis   THERAPY DIAG:  No diagnosis found.  Rationale for Evaluation and Treatment: Rehabilitation  ONSET DATE: ***  SUBJECTIVE:   SUBJECTIVE STATEMENT: ***  PERTINENT HISTORY: *** PAIN:  Are you having pain? Yes: NPRS scale: ***/10 Pain location: *** Pain description:  *** Aggravating factors: *** Relieving factors: ***  PRECAUTIONS: None  RED FLAGS: None   WEIGHT BEARING RESTRICTIONS: No  FALLS:  Has patient fallen in last 6 months? {fallsyesno:27318}  LIVING ENVIRONMENT: Lives with: lives with their spouse Lives in: House/apartment Stairs: {opstairs:27293} Has following equipment at home: {Assistive devices:23999}  OCCUPATION: ***  PLOF: Independent  PATIENT GOALS: ***  NEXT MD VISIT: 06/23/23  OBJECTIVE:  Note: Objective measures were completed at Evaluation unless otherwise noted.  DIAGNOSTIC FINDINGS: xray l foot 04/14/23 - negative   PATIENT SURVEYS:  FOTO   COGNITION: Overall cognitive status: Within functional limits for tasks assessed     SENSATION: {sensation:27233}  EDEMA:  {edema:24020}  MUSCLE LENGTH: Hamstrings: Right *** deg; Left *** deg Maisie Fus test: Right *** deg; Left *** deg  POSTURE: {posture:25561}  PALPATION: ***  LOWER EXTREMITY ROM:  Active ROM Right eval Left eval  Hip flexion    Hip extension    Hip abduction    Hip adduction    Hip internal rotation    Hip external rotation    Knee flexion    Knee extension    Ankle dorsiflexion    Ankle plantarflexion    Ankle inversion    Ankle eversion     (Blank rows = not tested)  LOWER EXTREMITY MMT:  MMT Right eval Left eval  Hip flexion    Hip extension    Hip abduction    Hip adduction  Hip internal rotation    Hip external rotation    Knee flexion    Knee extension    Ankle dorsiflexion    Ankle plantarflexion    Ankle inversion    Ankle eversion     (Blank rows = not tested)  FUNCTIONAL TESTS:  5 times sit to stand:   GAIT: Distance walked: *** Assistive device utilized: {Assistive devices:23999} Level of assistance: {Levels of assistance:24026} Comments: ***   [facility] Adult PT Treatment:                                                DATE: *** Therapeutic Exercise: *** Manual Therapy: *** Neuromuscular  re-ed: *** Therapeutic Activity: *** Gait: *** Modalities: *** Self Care: ***    PATIENT EDUCATION:  Education details: POC; HEP  Person educated: Patient Education method: Explanation, Demonstration, Tactile cues, Verbal cues, and Handouts Education comprehension: verbalized understanding, returned demonstration, verbal cues required, tactile cues required, and needs further education  HOME EXERCISE PROGRAM: ***  ASSESSMENT:  CLINICAL IMPRESSION: Patient is a 47 y.o. male who was seen today for physical therapy evaluation and treatment for L plantar fasciitis. Patient has a history of increased L heel pain over the past 4 months. He has had L heel pain on and off for the past 2 years.    OBJECTIVE IMPAIRMENTS: Abnormal gait, decreased activity tolerance, decreased balance, decreased endurance, decreased mobility, difficulty walking, decreased ROM, decreased strength, impaired flexibility, postural dysfunction, and pain.   ACTIVITY LIMITATIONS: standing, squatting, stairs, transfers, and locomotion level  PARTICIPATION LIMITATIONS: occupation and yard work  PERSONAL FACTORS: Education, Past/current experiences, and Time since onset of injury/illness/exacerbation are also affecting patient's functional outcome.   REHAB POTENTIAL: Good  CLINICAL DECISION MAKING: Stable/uncomplicated  EVALUATION COMPLEXITY: Low   GOALS: Goals reviewed with patient? Yes  SHORT TERM GOALS: Target date: *** Independent in initial HEP  Baseline: Goal status: INITIAL  2.  *** Baseline:  Goal status: INITIAL   LONG TERM GOALS: Target date: ***  *** Baseline:  Goal status: INITIAL  2.  *** Baseline:  Goal status: INITIAL  3.  *** Baseline:  Goal status: INITIAL  4.  *** Baseline:  Goal status: INITIAL  5.  *** Baseline:  Goal status: INITIAL  6.  *** Baseline:  Goal status: INITIAL   PLAN:  PT FREQUENCY: 2x/week  PT DURATION: 12 weeks  PLANNED  INTERVENTIONS: Therapeutic exercises, Therapeutic activity, Neuromuscular re-education, Balance training, Gait training, Patient/Family education, Self Care, Joint mobilization, Stair training, Aquatic Therapy, Dry Needling, Electrical stimulation, Spinal mobilization, Cryotherapy, Moist heat, Taping, Ultrasound, Ionotophoresis 4mg /ml Dexamethasone, Manual therapy, and Re-evaluation  PLAN FOR NEXT SESSION: review and progress exercises; education; manual work, DN, modalities as indicated    [employer], PT 05/15/2023, 4:49 PM

## 2023-05-17 ENCOUNTER — Encounter: Payer: Self-pay | Admitting: Rehabilitative and Restorative Service Providers"

## 2023-05-17 ENCOUNTER — Other Ambulatory Visit: Payer: Self-pay

## 2023-05-17 ENCOUNTER — Ambulatory Visit: Payer: 59 | Attending: Podiatry | Admitting: Rehabilitative and Restorative Service Providers"

## 2023-05-17 DIAGNOSIS — R29898 Other symptoms and signs involving the musculoskeletal system: Secondary | ICD-10-CM | POA: Diagnosis present

## 2023-05-17 DIAGNOSIS — M722 Plantar fascial fibromatosis: Secondary | ICD-10-CM | POA: Diagnosis present

## 2023-06-06 ENCOUNTER — Ambulatory Visit: Payer: 59

## 2023-06-08 ENCOUNTER — Ambulatory Visit: Payer: 59

## 2023-06-13 ENCOUNTER — Ambulatory Visit: Payer: 59

## 2023-06-15 ENCOUNTER — Ambulatory Visit: Payer: 59

## 2023-06-20 ENCOUNTER — Other Ambulatory Visit: Payer: Self-pay | Admitting: Medical-Surgical

## 2023-06-20 ENCOUNTER — Other Ambulatory Visit: Payer: Self-pay

## 2023-06-21 NOTE — Telephone Encounter (Signed)
Last office visit 01/17/2023  Last labs 11/05/2013  Last filled 12/29/2022   Looks like [facility] is managing this.

## 2023-06-23 ENCOUNTER — Other Ambulatory Visit (HOSPITAL_COMMUNITY): Payer: Self-pay

## 2023-06-23 ENCOUNTER — Ambulatory Visit: Payer: 59 | Admitting: Podiatry

## 2023-06-23 ENCOUNTER — Encounter: Payer: Self-pay | Admitting: Podiatry

## 2023-06-23 DIAGNOSIS — M722 Plantar fascial fibromatosis: Secondary | ICD-10-CM

## 2023-06-23 MED ORDER — DEXAMETHASONE SODIUM PHOSPHATE 120 MG/30ML IJ SOLN
4.0000 mg | Freq: Once | INTRAMUSCULAR | Status: AC
Start: 2023-06-23 — End: 2023-06-23
  Administered 2023-06-23: 4 mg via INTRA_ARTICULAR

## 2023-06-23 MED ORDER — TRIAMCINOLONE ACETONIDE 10 MG/ML IJ SUSP
2.5000 mg | Freq: Once | INTRAMUSCULAR | Status: AC
Start: 2023-06-23 — End: 2023-06-23
  Administered 2023-06-23: 2.5 mg via INTRA_ARTICULAR

## 2023-06-23 NOTE — Progress Notes (Signed)
  Subjective:  Patient ID: Allen Fisher, male    DOB: 09/30/1975,   MRN: [id number]  No chief complaint on file.   47 y.o. male presents for follow-up of plantar fascitis that has been ongoing for a year. Relate the injection did help for a while but pain came back. Relates PT didn't help much and has been stretching at home. Has been wearing support and brace was irritating. Only thing that helps is compression stockings.    . Denies any other pedal complaints. Denies n/v/f/c.   Past Medical History:  Diagnosis Date   Environmental allergies    Frequent sinus infections    History of kidney stones    HLD (hyperlipidemia)    Hypertension    Pneumonia    Sleep apnea    does not use cpap   Status post cholecystectomy 12/13/2016    Objective:  Physical Exam: Vascular: DP/PT pulses 2/4 bilateral. CFT <3 seconds. Normal hair growth on digits. No edema.  Skin. No lacerations or abrasions bilateral feet.  Musculoskeletal: MMT 5/5 bilateral lower extremities in DF, PF, Inversion and Eversion. Deceased ROM in DF of ankle joint. Tender to medial calcaneal tubercle on the left. No pain along achilles, PT or arch. No pain with calcaneal squeeze.  Neurological: Sensation intact to light touch.   Assessment:   1. Plantar fasciitis, left      Plan:  Patient was evaluated and treated and all questions answered. Discussed plantar fasciitis with patient.  X-rays reviewed and discussed with patient. No acute fractures or dislocations noted. Mild spurring noted at inferior calcaneus.  Discussed treatment options including, ice, NSAIDS, supportive shoes, bracing, and stretching.  Continue brace and stretching.  Discussed still not much improvement and discussed given chronic nature would like to get an MRI and determine cause and possibly plan for surgical intervention.  Patient requesting injection today. Procedure note below.   Follow-up 6 weeks or sooner if any problems arise. In the meantime,  encouraged to call the office with any questions, concerns, change in symptoms.   Procedure:  Discussed etiology, pathology, conservative vs. surgical therapies. At this time a plantar fascial injection was recommended.  The patient agreed and a sterile skin prep was applied.  An injection consisting of  1cc dexamethasone 0.5 cc kenalog and 1cc marcaine mixture was infiltrated at the point of maximal tenderness on the left Heel.  Bandaid applied. The patient tolerated this well and was given instructions for aftercare.    Louann Sjogren, DPM

## 2023-06-30 ENCOUNTER — Ambulatory Visit
Admission: RE | Admit: 2023-06-30 | Discharge: 2023-06-30 | Disposition: A | Discharge status: Home / Self Care | Payer: 59 | Source: Ambulatory Visit | Attending: Podiatry

## 2023-06-30 DIAGNOSIS — M722 Plantar fascial fibromatosis: Secondary | ICD-10-CM

## 2023-07-03 ENCOUNTER — Encounter: Payer: Self-pay | Admitting: Podiatry

## 2023-07-05 ENCOUNTER — Other Ambulatory Visit (HOSPITAL_COMMUNITY): Payer: Self-pay

## 2023-07-06 ENCOUNTER — Encounter: Payer: Self-pay | Admitting: Podiatry

## 2023-07-06 ENCOUNTER — Ambulatory Visit: Payer: 59 | Admitting: Podiatry

## 2023-07-06 DIAGNOSIS — M722 Plantar fascial fibromatosis: Secondary | ICD-10-CM

## 2023-07-06 NOTE — Progress Notes (Signed)
  Subjective:  Patient ID: Allen Fisher, male    DOB: [birth year],   MRN: [id number]  No chief complaint on file.   47 y.o. male presents for follow-up of plantar fascitis that has been ongoing for a year. Relates last injection gave some relief but still getting pain. Ready to review MRI and discuss possible surgery at this time. He has exhausted conservative efforts.   . Denies any other pedal complaints. Denies n/v/f/c.   Past Medical History:  Diagnosis Date   Environmental allergies    Frequent sinus infections    History of kidney stones    HLD (hyperlipidemia)    Hypertension    Pneumonia    Sleep apnea    does not use cpap   Status post cholecystectomy 12/13/2016    Objective:  Physical Exam: Vascular: DP/PT pulses 2/4 bilateral. CFT <3 seconds. Normal hair growth on digits. No edema.  Skin. No lacerations or abrasions bilateral feet.  Musculoskeletal: MMT 5/5 bilateral lower extremities in DF, PF, Inversion and Eversion. Deceased ROM in DF of ankle joint. Tender to medial calcaneal tubercle on the left. No pain along achilles, PT or arch. No pain with calcaneal squeeze.  Neurological: Sensation intact to light touch.   Assessment:   1. Plantar fasciitis, left       Plan:  Patient was evaluated and treated and all questions answered. Discussed plantar fasciitis with patient.  X-rays reviewed and discussed with patient. No acute fractures or dislocations noted. Mild spurring noted at inferior calcaneus.  Discussed treatment options including, ice, NSAIDS, supportive shoes, bracing, and stretching.  Continue brace and stretching.  MRI reviewed. Still awaiting official read but does appear to be inflammation around plantar fascia and possible small interstitial tear. Discussed results with patient and treatments options from here. Discussed surgical intervention with plantar fascia release. Discussed perioperative course in detail.  -Informed surgical risk consent was  reviewed and read aloud to the patient.  I reviewed the films.  I have discussed my findings with the patient in great detail.  I have discussed all risks including but not limited to infection, stiffness, scarring, limp, disability, deformity, damage to blood vessels and nerves, numbness, poor healing, need for braces, arthritis, chronic pain, amputation, death.  All benefits and realistic expectations discussed in great detail.  I have made no promises as to the outcome.  I have provided realistic expectations.  I have offered the patient a 2nd opinion, which they have declined and assured me they preferred to proceed despite the risks. Plan for surgery tentatively December 17th.   Louann Sjogren, DPM

## 2023-07-07 ENCOUNTER — Other Ambulatory Visit: Payer: 59

## 2023-07-10 ENCOUNTER — Telehealth: Payer: Self-pay | Admitting: Podiatry

## 2023-07-10 NOTE — Telephone Encounter (Signed)
DOS-08/01/2023  PLANTAR FASCIA RELEASE [id number]  [employer] EFFECTIVE DATE-08/15/2022  DEDUCTIBLE- $500.00 WITH REMAINING $0.00 OOP-$2500.00 WITH REMAINING $897.03 COINSURANCE- 20%  PER THE [employer] WEBSITE PORTAL, PRIOR AUTH IS NOT REQUIRED FOR CPT CODE 01027.  AUTH Decision ID #: [id number]

## 2023-07-19 ENCOUNTER — Telehealth: Payer: Self-pay | Admitting: Podiatry

## 2023-07-19 NOTE — Telephone Encounter (Signed)
Pt called stating that he's called with questions regarding his 12/17 surgery with Dr.Sikora and has yet to hear anything back. The best number to reach him is [phone].

## 2023-08-01 ENCOUNTER — Other Ambulatory Visit: Payer: Self-pay | Admitting: Podiatry

## 2023-08-01 DIAGNOSIS — M722 Plantar fascial fibromatosis: Secondary | ICD-10-CM | POA: Diagnosis not present

## 2023-08-01 MED ORDER — ONDANSETRON HCL 4 MG PO TABS: 4.0000 mg | ORAL_TABLET | Freq: Three times a day (TID) | ORAL | 0 refills | Status: DC | PRN

## 2023-08-01 MED ORDER — OXYCODONE-ACETAMINOPHEN 5-325 MG PO TABS: 1.0000 | ORAL_TABLET | ORAL | 0 refills | Status: AC | PRN

## 2023-08-02 ENCOUNTER — Encounter: Payer: Self-pay | Admitting: Podiatry

## 2023-08-10 ENCOUNTER — Ambulatory Visit (INDEPENDENT_AMBULATORY_CARE_PROVIDER_SITE_OTHER): Payer: 59 | Admitting: Podiatry

## 2023-08-10 ENCOUNTER — Ambulatory Visit: Payer: 59

## 2023-08-10 ENCOUNTER — Other Ambulatory Visit: Payer: Self-pay

## 2023-08-10 ENCOUNTER — Encounter: Payer: Self-pay | Admitting: Podiatry

## 2023-08-10 DIAGNOSIS — M25572 Pain in left ankle and joints of left foot: Secondary | ICD-10-CM | POA: Diagnosis not present

## 2023-08-10 DIAGNOSIS — M722 Plantar fascial fibromatosis: Secondary | ICD-10-CM

## 2023-08-10 DIAGNOSIS — Z9889 Other specified postprocedural states: Secondary | ICD-10-CM

## 2023-08-10 NOTE — Progress Notes (Signed)
  Subjective:  Patient ID: Allen Fisher, male    DOB: Jan 02, 1976,  MRN: [id number]  No chief complaint on file.   DOS: 08/01/23 Procedure: Left plantar fascia release  47 y.o. male returns for POV#1. Doing well and managing pain   Review of Systems: Negative except as noted in the HPI. Denies N/V/F/Ch.  Past Medical History:  Diagnosis Date   Environmental allergies    Frequent sinus infections    History of kidney stones    HLD (hyperlipidemia)    Hypertension    Pneumonia    Sleep apnea    does not use cpap   Status post cholecystectomy 12/13/2016    Current Outpatient Medications:    amLODipine-valsartan (EXFORGE) 10-320 MG tablet, Take 1 tablet by mouth daily., Disp: 90 tablet, Rfl: 1   colesevelam (WELCHOL) 625 MG tablet, TAKE 3 TABLETS BY MOUTH  TWICE DAILY WITH A MEAL (Patient taking differently: Take 625 mg by mouth daily with breakfast.), Disp: 540 tablet, Rfl: 3   fluticasone (FLONASE) 50 MCG/ACT nasal spray, Place 1 spray into both nostrils daily as needed for allergies., Disp: 16 g, Rfl: 2   hydrochlorothiazide (HYDRODIURIL) 25 MG tablet, Take 1 tablet (25 mg total) by mouth daily., Disp: 90 tablet, Rfl: 3   ketoconazole (NIZORAL) 2 % cream, Apply to affected area daily as directed., Disp: 60 g, Rfl: 2   meloxicam (MOBIC) 15 MG tablet, Take 1 tablet (15 mg total) by mouth daily., Disp: 30 tablet, Rfl: 0   ondansetron (ZOFRAN) 4 MG tablet, Take 1 tablet (4 mg total) by mouth every 8 (eight) hours as needed for nausea or vomiting., Disp: 20 tablet, Rfl: 0   testosterone cypionate (DEPOTESTOSTERONE CYPIONATE) 200 MG/ML injection, Inject 1 mL (200 mg) into the muscle every 14 days., Disp: 2 mL, Rfl: 0  Social History   Tobacco Use  Smoking Status Never  Smokeless Tobacco Current   Types: Snuff    No Known Allergies Objective:  There were no vitals filed for this visit. There is no height or weight on file to calculate BMI. Constitutional Well developed. Well  nourished.  Vascular Foot warm and well perfused. Capillary refill normal to all digits.   Neurologic Normal speech. Oriented to person, place, and time. Epicritic sensation to light touch grossly present bilaterally.  Dermatologic Skin healing well without signs of infection. Skin edges well coapted without signs of infection.  Orthopedic: Tenderness to palpation noted about the surgical site.   Radiographs: No acute changes on radiographs  Assessment:  No diagnosis found. Plan:  Patient was evaluated and treated and all questions answered.  S/p foot surgery left -Progressing as expected post-operatively. -WB Status: WBAT in regular shoe -Sutures: suture removed without incident.  -Medications: n/a -Foot redressed.   Return in 2 weeks for recheck  No follow-ups on file.

## 2023-08-15 ENCOUNTER — Ambulatory Visit
Admission: RE | Admit: 2023-08-15 | Discharge: 2023-08-15 | Disposition: A | Discharge status: Home / Self Care | Payer: 59 | Source: Ambulatory Visit | Attending: Family Medicine | Admitting: Family Medicine

## 2023-08-15 VITALS — BP 130/89 | HR 100 | Temp 98.1°F | Resp 17

## 2023-08-15 DIAGNOSIS — R6889 Other general symptoms and signs: Secondary | ICD-10-CM | POA: Diagnosis not present

## 2023-08-15 DIAGNOSIS — R051 Acute cough: Secondary | ICD-10-CM | POA: Diagnosis not present

## 2023-08-15 MED ORDER — METHYLPREDNISOLONE SODIUM SUCC 125 MG IJ SOLR
80.0000 mg | Freq: Once | INTRAMUSCULAR | Status: AC
  Administered 2023-08-15: 80 mg via INTRAMUSCULAR

## 2023-08-15 MED ORDER — AZITHROMYCIN 250 MG PO TABS: 250.0000 mg | ORAL_TABLET | Freq: Every day | ORAL | 0 refills | Status: DC

## 2023-08-15 MED ORDER — PREDNISONE 50 MG PO TABS: ORAL_TABLET | ORAL | 0 refills | Status: DC

## 2023-08-15 NOTE — Discharge Instructions (Addendum)
Continue to drink lots of fluids.  Continue your Mucinex.  Take prednisone daily as directed.  Fill and take the antibiotic if you fail to improve

## 2023-08-15 NOTE — ED Provider Notes (Signed)
 [name]    CSN: [id number] Arrival date & time: 08/15/23  [id number]      History   Chief Complaint Chief Complaint  Patient presents with   Cough   Nasal Congestion    chest    HPI Allen Fisher is a 47 y.o. male.   Patient admits he thinks he a flulike virus.  He has had an upper respiratory infection for the last several days with fever and chills, runny nose and cough.  He is here because he always ends up with bronchitis that requires antibiotics.  He is requesting an antibiotic prescription.  He does not want flu testing or Tamiflu.  He has a harsh cough.  It is not productive.  His fever chills and bodyaches are improving    Past Medical History:  Diagnosis Date   Environmental allergies    Frequent sinus infections    History of kidney stones    HLD (hyperlipidemia)    Hypertension    Pneumonia    Sleep apnea    does not use cpap   Status post cholecystectomy 12/13/2016    Patient Active Problem List   Diagnosis Date Noted   BMI 28.0-28.9,adult 09/22/2022   OSA (obstructive sleep apnea) 09/22/2022   Essential hypertension 06/28/2022   Cervicogenic headache 04/22/2021   Loud snoring 04/22/2021   Impingement syndrome of right shoulder region 09/04/2017   Seasonal allergic rhinitis 12/13/2016   Onychomycosis 12/13/2016   Acne vulgaris 07/04/2014   Hypogonadism male 06/05/2014   Gilbert's syndrome 06/05/2014   IBS (irritable bowel syndrome) 06/05/2014   Class 1 obesity due to excess calories with serious comorbidity and body mass index (BMI) of 32.0 to 32.9 in adult 06/05/2014   Elevated LFTs 06/05/2014   Asthma 10/12/2010    Past Surgical History:  Procedure Laterality Date   BACK SURGERY     CHOLECYSTECTOMY     COLONOSCOPY     DRUG INDUCED ENDOSCOPY Bilateral 11/02/2022   Procedure: DRUG INDUCED ENDOSCOPY;  Surgeon: Carlie Clark, MD;  Location: [facility] OR;  Service: ENT;  Laterality: Bilateral;   KNEE SURGERY Bilateral    x 2   SHOULDER  SURGERY Bilateral    x 3 - on right side surg x 2 and 1 on left side   WISDOM TOOTH EXTRACTION         Home Medications    Prior to Admission medications   Medication Sig Start Date End Date Taking? Authorizing Provider  azithromycin  (ZITHROMAX ) 250 MG tablet Take 1 tablet (250 mg total) by mouth daily. Take first 2 tablets together, then 1 every day until finished. 08/15/23  Yes Maranda Jamee Jacob, MD  predniSONE  (DELTASONE ) 50 MG tablet Take once a day for 5 days.  Take with food 08/15/23  Yes Maranda Jamee Jacob, MD  amLODipine -valsartan  (EXFORGE ) 10-320 MG tablet Take 1 tablet by mouth daily. 01/17/23   Willo Mini, NP  colesevelam  (WELCHOL ) 625 MG tablet TAKE 3 TABLETS BY MOUTH  TWICE DAILY WITH A MEAL Patient taking differently: Take 625 mg by mouth daily with breakfast. 04/29/22   Willo Mini, NP  fluticasone  (FLONASE ) 50 MCG/ACT nasal spray Place 1 spray into both nostrils daily as needed for allergies. 12/29/22   Willo Mini, NP  hydrochlorothiazide  (HYDRODIURIL ) 25 MG tablet Take 1 tablet (25 mg total) by mouth daily. 01/17/23   Willo Mini, NP  ketoconazole  (NIZORAL ) 2 % cream Apply to affected area daily as directed. 04/18/23   Sikora, Rebecca, DPM  meloxicam  (MOBIC ) 15  MG tablet Take 1 tablet (15 mg total) by mouth daily. 04/14/23   Sikora, Rebecca, DPM  ondansetron  (ZOFRAN ) 4 MG tablet Take 1 tablet (4 mg total) by mouth every 8 (eight) hours as needed for nausea or vomiting. 08/01/23   Joya Stabs, DPM  testosterone  cypionate (DEPOTESTOSTERONE CYPIONATE) 200 MG/ML injection Inject 1 mL (200 mg) into the muscle every 14 days. 12/29/22   Willo Mini, NP    Family History Family History  Problem Relation Age of Onset   Cancer Mother        uterine   Arrhythmia Mother    Healthy Father     Social History Social History   Tobacco Use   Smoking status: Never   Smokeless tobacco: Current    Types: Snuff  Vaping Use   Vaping status: Never Used  Substance Use Topics    Alcohol use: Yes    Comment: occasionally   Drug use: No     Allergies   Patient has no known allergies.   Review of Systems Review of Systems See HPI  Physical Exam Triage Vital Signs ED Triage Vitals  Encounter Vitals Group     BP 08/15/23 1002 130/89     Systolic BP Percentile --      Diastolic BP Percentile --      Pulse Rate 08/15/23 1002 100     Resp 08/15/23 1002 17     Temp 08/15/23 1002 98.1 F (36.7 C)     Temp Source 08/15/23 1002 Oral     SpO2 08/15/23 1002 98 %     Weight --      Height --      Head Circumference --      Peak Flow --      Pain Score 08/15/23 1003 0     Pain Loc --      Pain Education --      Exclude from Growth Chart --    No data found.  Updated Vital Signs BP 130/89 (BP Location: Right Arm)   Pulse 100   Temp 98.1 F (36.7 C) (Oral)   Resp 17   SpO2 98%    Physical Exam Constitutional:      General: He is not in acute distress.    Appearance: Normal appearance. He is well-developed and normal weight.  HENT:     Head: Normocephalic and atraumatic.     Right Ear: Tympanic membrane normal.     Left Ear: Tympanic membrane normal.     Nose: No congestion or rhinorrhea.     Mouth/Throat:     Mouth: Mucous membranes are dry.  Eyes:     Conjunctiva/sclera: Conjunctivae normal.     Pupils: Pupils are equal, round, and reactive to light.  Cardiovascular:     Rate and Rhythm: Normal rate.     Heart sounds: Normal heart sounds.  Pulmonary:     Effort: Pulmonary effort is normal. No respiratory distress.     Breath sounds: Normal breath sounds.  Abdominal:     General: There is no distension.     Palpations: Abdomen is soft.  Musculoskeletal:        General: Normal range of motion.     Cervical back: Normal range of motion.  Skin:    General: Skin is warm and dry.  Neurological:     Mental Status: He is alert.      UC Treatments / Results  Labs (all labs ordered are listed, but only abnormal results  are  displayed) Labs Reviewed - No data to display  EKG   Radiology No results found.  Procedures Procedures (including critical care time)  Medications Ordered in UC Medications  methylPREDNISolone  sodium succinate (SOLU-MEDROL ) 125 mg/2 mL injection 80 mg (has no administration in time range)    Initial Impression / Assessment and Plan / UC Course  I have reviewed the triage vital signs and the nursing notes.  Pertinent labs & imaging results that were available during my care of the patient were reviewed by me and considered in my medical decision making (see chart for details).     Will give patient prednisone  shot as this is what he is accustomed to getting here.  He states this always helps.  Will also given written prescription for an antibiotic for him to take if he fails to improve over 7 to 10 days.  Viral infections discussed Final Clinical Impressions(s) / UC Diagnoses   Final diagnoses:  Flu-like symptoms  Acute cough     Discharge Instructions      Continue to drink lots of fluids.  Continue your Mucinex .  Take prednisone  daily as directed.  Fill and take the antibiotic if you fail to improve     ED Prescriptions     Medication Sig Dispense Auth. Provider   predniSONE  (DELTASONE ) 50 MG tablet Take once a day for 5 days.  Take with food 5 tablet Maranda Jamee Jacob, MD   azithromycin  (ZITHROMAX ) 250 MG tablet Take 1 tablet (250 mg total) by mouth daily. Take first 2 tablets together, then 1 every day until finished. 6 tablet Maranda Jamee Jacob, MD      PDMP not reviewed this encounter.   Maranda Jamee Jacob, MD 08/15/23 [phone]

## 2023-08-15 NOTE — ED Triage Notes (Addendum)
Pt c/o cough and congestion x 3 days. Fever on Sat. Tmax of 103.4. States he's pretty sure he had the flu, just worried about the respiratory sxs lingering. Hx of bronchitis and pneumonia. Mucinex prn.

## 2023-08-24 ENCOUNTER — Encounter: Payer: 59 | Admitting: Podiatry

## 2023-08-31 ENCOUNTER — Ambulatory Visit (INDEPENDENT_AMBULATORY_CARE_PROVIDER_SITE_OTHER): Payer: 59 | Admitting: Podiatry

## 2023-08-31 ENCOUNTER — Encounter: Payer: Self-pay | Admitting: Podiatry

## 2023-08-31 DIAGNOSIS — Z9889 Other specified postprocedural states: Secondary | ICD-10-CM

## 2023-08-31 DIAGNOSIS — M722 Plantar fascial fibromatosis: Secondary | ICD-10-CM

## 2023-08-31 NOTE — Progress Notes (Signed)
  Subjective:  Patient ID: Allen Fisher, male    DOB: 1976/05/12,  MRN: [id number]  No chief complaint on file.   DOS: 08/01/23 Procedure: Left plantar fascia release  48 y.o. male returns for POV#2. Doing well and no pain  Review of Systems: Negative except as noted in the HPI. Denies N/V/F/Ch.  Past Medical History:  Diagnosis Date   Environmental allergies    Frequent sinus infections    History of kidney stones    HLD (hyperlipidemia)    Hypertension    Pneumonia    Sleep apnea    does not use cpap   Status post cholecystectomy 12/13/2016    Current Outpatient Medications:    amLODipine-valsartan (EXFORGE) 10-320 MG tablet, Take 1 tablet by mouth daily., Disp: 90 tablet, Rfl: 1   azithromycin (ZITHROMAX) 250 MG tablet, Take 1 tablet (250 mg total) by mouth daily. Take first 2 tablets together, then 1 every day until finished., Disp: 6 tablet, Rfl: 0   colesevelam (WELCHOL) 625 MG tablet, TAKE 3 TABLETS BY MOUTH  TWICE DAILY WITH A MEAL (Patient taking differently: Take 625 mg by mouth daily with breakfast.), Disp: 540 tablet, Rfl: 3   fluticasone (FLONASE) 50 MCG/ACT nasal spray, Place 1 spray into both nostrils daily as needed for allergies., Disp: 16 g, Rfl: 2   hydrochlorothiazide (HYDRODIURIL) 25 MG tablet, Take 1 tablet (25 mg total) by mouth daily., Disp: 90 tablet, Rfl: 3   ketoconazole (NIZORAL) 2 % cream, Apply to affected area daily as directed., Disp: 60 g, Rfl: 2   meloxicam (MOBIC) 15 MG tablet, Take 1 tablet (15 mg total) by mouth daily., Disp: 30 tablet, Rfl: 0   ondansetron (ZOFRAN) 4 MG tablet, Take 1 tablet (4 mg total) by mouth every 8 (eight) hours as needed for nausea or vomiting., Disp: 20 tablet, Rfl: 0   predniSONE (DELTASONE) 50 MG tablet, Take once a day for 5 days.  Take with food, Disp: 5 tablet, Rfl: 0   testosterone cypionate (DEPOTESTOSTERONE CYPIONATE) 200 MG/ML injection, Inject 1 mL (200 mg) into the muscle every 14 days., Disp: 2 mL, Rfl:  0  Social History   Tobacco Use  Smoking Status Never  Smokeless Tobacco Current   Types: Snuff    No Known Allergies Objective:  There were no vitals filed for this visit. There is no height or weight on file to calculate BMI. Constitutional Well developed. Well nourished.  Vascular Foot warm and well perfused. Capillary refill normal to all digits.   Neurologic Normal speech. Oriented to person, place, and time. Epicritic sensation to light touch grossly present bilaterally.  Dermatologic Skin healing well without signs of infection. Skin edges well coapted without signs of infection.  Orthopedic: Tenderness to palpation noted about the surgical site.   Radiographs: No acute changes on radiographs  Assessment:   1. Post-operative state   2. Plantar fasciitis, left    Plan:  Patient was evaluated and treated and all questions answered.  S/p foot surgery left -Progressing as expected post-operatively. -WB Status: WBAT in regular shoe.  -Medications: n/a  Patient released from surgical standpoint. Return as needed.   No follow-ups on file.

## 2023-10-02 ENCOUNTER — Encounter: Payer: Self-pay | Admitting: Medical-Surgical

## 2023-10-06 ENCOUNTER — Ambulatory Visit (INDEPENDENT_AMBULATORY_CARE_PROVIDER_SITE_OTHER): Payer: 59 | Admitting: Medical-Surgical

## 2023-10-06 ENCOUNTER — Other Ambulatory Visit (HOSPITAL_COMMUNITY): Payer: Self-pay

## 2023-10-06 VITALS — BP 130/86 | HR 88 | Resp 20 | Ht 66.0 in | Wt 170.0 lb

## 2023-10-06 DIAGNOSIS — E66811 Obesity, class 1: Secondary | ICD-10-CM

## 2023-10-06 DIAGNOSIS — E291 Testicular hypofunction: Secondary | ICD-10-CM

## 2023-10-06 DIAGNOSIS — Z23 Encounter for immunization: Secondary | ICD-10-CM

## 2023-10-06 DIAGNOSIS — Z6832 Body mass index (BMI) 32.0-32.9, adult: Secondary | ICD-10-CM

## 2023-10-06 DIAGNOSIS — Z0000 Encounter for general adult medical examination without abnormal findings: Secondary | ICD-10-CM

## 2023-10-06 DIAGNOSIS — G4733 Obstructive sleep apnea (adult) (pediatric): Secondary | ICD-10-CM | POA: Diagnosis not present

## 2023-10-06 DIAGNOSIS — I10 Essential (primary) hypertension: Secondary | ICD-10-CM | POA: Diagnosis not present

## 2023-10-06 DIAGNOSIS — R7303 Prediabetes: Secondary | ICD-10-CM

## 2023-10-06 DIAGNOSIS — Z1211 Encounter for screening for malignant neoplasm of colon: Secondary | ICD-10-CM

## 2023-10-06 DIAGNOSIS — K580 Irritable bowel syndrome with diarrhea: Secondary | ICD-10-CM

## 2023-10-06 DIAGNOSIS — E6609 Other obesity due to excess calories: Secondary | ICD-10-CM

## 2023-10-06 MED ORDER — TESTOSTERONE CYPIONATE 200 MG/ML IM SOLN
200.0000 mg | INTRAMUSCULAR | 3 refills | Status: AC
  Filled 2023-10-06: qty 2, 28d supply, fill #0

## 2023-10-06 MED ORDER — ZEPBOUND 15 MG/0.5ML ~~LOC~~ SOAJ
15.0000 mg | SUBCUTANEOUS | 11 refills | Status: DC
  Filled 2023-10-06 – 2023-10-23 (×2): qty 2, 28d supply, fill #0
  Filled 2023-12-02: qty 2, 28d supply, fill #1
  Filled 2023-12-25: qty 2, 28d supply, fill #2
  Filled 2024-01-24: qty 2, 28d supply, fill #3
  Filled 2024-03-02: qty 2, 28d supply, fill #4
  Filled 2024-04-09 – 2024-04-11 (×2): qty 2, 28d supply, fill #5
  Filled 2024-05-20: qty 2, 28d supply, fill #6
  Filled 2024-06-26: qty 2, 28d supply, fill #7
  Filled 2024-07-29 – 2024-07-30 (×2): qty 2, 28d supply, fill #8
  Filled 2024-08-28: qty 2, 28d supply, fill #9
  Filled 2024-09-18 – 2024-09-19 (×2): qty 2, 28d supply, fill #10

## 2023-10-06 NOTE — Progress Notes (Signed)
 Complete physical exam  Patient: Allen Fisher   DOB: Jan 28, 1976   47 y.o. Male  MRN: [id number]  Subjective:    Chief Complaint  Patient presents with   Annual Exam   Weight Check    Allen Fisher is a 48 y.o. male who presents today for a complete physical exam. He reports consuming a general diet.  Walking and exercising regularly.  He generally feels well. He reports sleeping well. He does not have additional problems to discuss today.    Most recent fall risk assessment:    11/17/2021    8:35 AM  Fall Risk   Falls in the past year? 0  Number falls in past yr: 0  Injury with Fall? 0  Risk for fall due to : No Fall Risks  Follow up Falls evaluation completed     Most recent depression screenings:    10/06/2023    9:16 AM 07/25/2022    8:22 AM  PHQ 2/9 Scores  PHQ - 2 Score 0 0    Vision:Within last year and Dental: No current dental problems and Receives regular dental care    Patient Care Team: Christen Butter, NP as PCP - General (Nurse Practitioner)   Outpatient Medications Prior to Visit  Medication Sig   colesevelam (WELCHOL) 625 MG tablet TAKE 3 TABLETS BY MOUTH  TWICE DAILY WITH A MEAL (Patient taking differently: Take 625 mg by mouth daily with breakfast.)   [DISCONTINUED] amLODipine-valsartan (EXFORGE) 10-320 MG tablet Take 1 tablet by mouth daily.   [DISCONTINUED] hydrochlorothiazide (HYDRODIURIL) 25 MG tablet Take 1 tablet (25 mg total) by mouth daily.   [DISCONTINUED] testosterone cypionate (DEPOTESTOSTERONE CYPIONATE) 200 MG/ML injection Inject 1 mL (200 mg) into the muscle every 14 days.   [DISCONTINUED] azithromycin (ZITHROMAX) 250 MG tablet Take 1 tablet (250 mg total) by mouth daily. Take first 2 tablets together, then 1 every day until finished. (Patient not taking: Reported on 10/06/2023)   [DISCONTINUED] fluticasone (FLONASE) 50 MCG/ACT nasal spray Place 1 spray into both nostrils daily as needed for allergies.   [DISCONTINUED] ketoconazole (NIZORAL)  2 % cream Apply to affected area daily as directed.   [DISCONTINUED] meloxicam (MOBIC) 15 MG tablet Take 1 tablet (15 mg total) by mouth daily. (Patient not taking: Reported on 10/06/2023)   [DISCONTINUED] ondansetron (ZOFRAN) 4 MG tablet Take 1 tablet (4 mg total) by mouth every 8 (eight) hours as needed for nausea or vomiting. (Patient not taking: Reported on 10/06/2023)   [DISCONTINUED] predniSONE (DELTASONE) 50 MG tablet Take once a day for 5 days.  Take with food   No facility-administered medications prior to visit.    Review of Systems  Constitutional:  Negative for chills, fever, malaise/fatigue and weight loss.  HENT:  Positive for hearing loss (uses q-tips and his wife is worried about cerumen impaction). Negative for congestion, ear pain, sinus pain and sore throat.   Eyes:  Negative for blurred vision, photophobia and pain.  Respiratory:  Negative for cough, shortness of breath and wheezing.   Cardiovascular:  Negative for chest pain, palpitations and leg swelling.  Gastrointestinal:  Negative for abdominal pain, constipation, diarrhea, heartburn, nausea and vomiting.  Genitourinary:  Negative for dysuria, frequency and urgency.  Musculoskeletal:  Negative for falls and neck pain.  Skin:  Negative for itching and rash.  Neurological:  Negative for dizziness, weakness and headaches.  Endo/Heme/Allergies:  Negative for polydipsia. Does not bruise/bleed easily.  Psychiatric/Behavioral:  Negative for depression, substance abuse and suicidal ideas. The patient  is not nervous/anxious.      Objective:    BP 130/86 (BP Location: Left Arm, Cuff Size: Normal)   Pulse 88   Resp 20   Ht 5\' 6"  (1.676 m)   Wt 170 lb (77.1 kg)   SpO2 97%   BMI 27.44 kg/m    Physical Exam Vitals reviewed.  Constitutional:      General: He is not in acute distress.    Appearance: Normal appearance. He is not ill-appearing.  HENT:     Head: Normocephalic and atraumatic.     Right Ear: Tympanic  membrane, ear canal and external ear normal. There is no impacted cerumen.     Left Ear: Tympanic membrane, ear canal and external ear normal. There is no impacted cerumen.     Nose: Nose normal. No congestion or rhinorrhea.     Mouth/Throat:     Mouth: Mucous membranes are moist.     Pharynx: No oropharyngeal exudate or posterior oropharyngeal erythema.  Eyes:     General: No scleral icterus.       Right eye: No discharge.        Left eye: No discharge.     Extraocular Movements: Extraocular movements intact.     Conjunctiva/sclera: Conjunctivae normal.     Pupils: Pupils are equal, round, and reactive to light.  Neck:     Thyroid: No thyromegaly.     Vascular: No carotid bruit or JVD.     Trachea: Trachea normal.  Cardiovascular:     Rate and Rhythm: Normal rate and regular rhythm.     Pulses: Normal pulses.     Heart sounds: Normal heart sounds. No murmur heard.    No friction rub. No gallop.  Pulmonary:     Effort: Pulmonary effort is normal. No respiratory distress.  Abdominal:     General: Bowel sounds are normal. There is no distension.     Palpations: Abdomen is soft.     Tenderness: There is no abdominal tenderness. There is no guarding.  Musculoskeletal:        General: Normal range of motion.     Cervical back: Normal range of motion and neck supple.  Lymphadenopathy:     Cervical: No cervical adenopathy.  Skin:    General: Skin is warm and dry.  Neurological:     Mental Status: He is alert and oriented to person, place, and time.     Cranial Nerves: No cranial nerve deficit.  Psychiatric:        Mood and Affect: Mood normal.        Behavior: Behavior normal.        Thought Content: Thought content normal.        Judgment: Judgment normal.      No results found for any visits on 10/06/23.     Assessment & Plan:    Routine Health Maintenance and Physical Exam  Immunization History  Administered Date(s) Administered   Influenza,inj,Quad PF,6+ Mos  07/04/2014, 07/05/2018   Influenza-Unspecified 05/04/2022   PFIZER(Purple Top)SARS-COV-2 Vaccination 09/30/2019, 10/22/2019   Pneumococcal Polysaccharide-23 11/21/2008   Tdap 11/21/2008, 11/17/2021   Zoster, Live 06/14/2011    Health Maintenance  Topic Date Due   Pneumococcal Vaccine 88-28 Years old (2 of 2 - PCV) 11/21/2009   Colonoscopy  Never done   COVID-19 Vaccine (3 - 2024-25 season) 10/22/2023 (Originally 04/16/2023)   INFLUENZA VACCINE  11/13/2023 (Originally 03/16/2023)   DTaP/Tdap/Td (3 - Td or Tdap) 11/18/2031   HPV VACCINES  Aged Out   Hepatitis C Screening  Discontinued   HIV Screening  Discontinued    Discussed health benefits of physical activity, and encouraged him to engage in regular exercise appropriate for his age and condition.  1. Annual physical exam (Primary) Checking labs as below.  Up-to-date on preventative care.  Wellness information provided with AVS. - CBC with Differential/Platelet - CMP14+EGFR - Lipid panel  2. Class 1 obesity due to excess calories with serious comorbidity and body mass index (BMI) of 32.0 to 32.9 in adult Was able to get GLP-1's through an outside program however they recently changed his membership fee to about $700 for a 72-month..  They have been able to get insurance approval for Zepbound and he is now on 12.5 mg weekly.  The plan was to do this for 4 weeks then increase to 15 mg weekly if well-tolerated.  He would like to switch his management of the medication to [place] so I have happily sent in Zepbound 15 mg weekly to give [place] time to secure the prior authorization.  Make sure to complete the Zepbound 12.5 mg weekly dose for 4 weeks before moving up.  Continue low calorie diet as well as regular intentional exercise as an adjunct to the medication to further weight loss. - tirzepatide (ZEPBOUND) 15 MG/0.5ML Pen; Inject 15 mg into the skin once a week.  Dispense: 2 mL; Refill: 11  3. Moderate obstructive sleep apnea He does have  moderate obstructive sleep apnea that has improved with use of weight loss medications.  Was not a candidate for inspire after completing the full workup.  Not using a CPAP at this time. - tirzepatide (ZEPBOUND) 15 MG/0.5ML Pen; Inject 15 mg into the skin once a week.  Dispense: 2 mL; Refill: 11  4. Prediabetes Remote history of prediabetes.  Plan to continue Zepbound as prescribed. - tirzepatide (ZEPBOUND) 15 MG/0.5ML Pen; Inject 15 mg into the skin once a week.  Dispense: 2 mL; Refill: 11  5. Essential hypertension [I10] Blood pressure has historically been difficult to manage and he was on triple therapy however at home, his readings have been in the 120s/low 80s.  He has stopped all of his blood pressure medications and feels like he is doing well without them.  Recommend continuing low-sodium diet and regular intentional exercise.  Continue Zepbound for further weight loss. - tirzepatide (ZEPBOUND) 15 MG/0.5ML Pen; Inject 15 mg into the skin once a week.  Dispense: 2 mL; Refill: 11  6. Colon cancer screening Referring to GI for colon cancer screening. - Ambulatory referral to Gastroenterology  7. Irritable bowel syndrome with diarrhea Using WelChol on an as needed basis with good results.   9. Hypogonadism male Has been out of testosterone for about a month and a half.  He was getting this from an outside provider but wants to return to getting it for months.  Getting testosterone baseline today and then restart 200 mg IM every 2 weeks.  Plan to recheck testosterone between injections 3 and 4. - Testosterone  Return in about 6 months (around 04/04/2024) for chronic disease follow up.   Christen Butter, NP

## 2023-10-06 NOTE — Addendum Note (Signed)
 Addended by: Latanya Presser on: 10/06/2023 11:47 AM   Modules accepted: Orders

## 2023-10-07 ENCOUNTER — Encounter: Payer: Self-pay | Admitting: Medical-Surgical

## 2023-10-07 LAB — LIPID PANEL
Chol/HDL Ratio: 3 ratio (ref 0.0–5.0)
Cholesterol, Total: 136 mg/dL (ref 100–199)
HDL: 45 mg/dL (ref >39)
LDL Chol Calc (NIH): 74 mg/dL (ref 0–99)
Triglycerides: 89 mg/dL (ref 0–149)
VLDL Cholesterol Cal: 17 mg/dL (ref 5–40)

## 2023-10-07 LAB — CBC WITH DIFFERENTIAL/PLATELET
Basophils Absolute: 0.1 10*3/uL (ref 0.0–0.2)
Basos: 1 %
EOS (ABSOLUTE): 0.3 10*3/uL (ref 0.0–0.4)
Eos: 3 %
Hematocrit: 55.2 % — ABNORMAL HIGH (ref 37.5–51.0)
Hemoglobin: 18.2 g/dL — ABNORMAL HIGH (ref 13.0–17.7)
Immature Grans (Abs): 0 10*3/uL (ref 0.0–0.1)
Immature Granulocytes: 0 %
Lymphocytes Absolute: 1.4 10*3/uL (ref 0.7–3.1)
Lymphs: 15 %
MCH: 29.4 pg (ref 26.6–33.0)
MCHC: 33 g/dL (ref 31.5–35.7)
MCV: 89 fL (ref 79–97)
Monocytes Absolute: 0.7 10*3/uL (ref 0.1–0.9)
Monocytes: 7 %
Neutrophils Absolute: 7.1 10*3/uL — ABNORMAL HIGH (ref 1.4–7.0)
Neutrophils: 74 %
Platelets: 188 10*3/uL (ref 150–450)
RBC: 6.2 x10E6/uL — ABNORMAL HIGH (ref 4.14–5.80)
RDW: 12.9 % (ref 11.6–15.4)
WBC: 9.5 10*3/uL (ref 3.4–10.8)

## 2023-10-07 LAB — CMP14+EGFR
ALT: 19 IU/L (ref 0–44)
AST: 17 IU/L (ref 0–40)
Albumin: 4.8 g/dL (ref 4.1–5.1)
Alkaline Phosphatase: 57 IU/L (ref 44–121)
BUN/Creatinine Ratio: 8 — ABNORMAL LOW (ref 9–20)
BUN: 8 mg/dL (ref 6–24)
Bilirubin Total: 2.2 mg/dL — ABNORMAL HIGH (ref 0.0–1.2)
CO2: 21 mmol/L (ref 20–29)
Calcium: 10.1 mg/dL (ref 8.7–10.2)
Chloride: 106 mmol/L (ref 96–106)
Creatinine, Ser: 1.06 mg/dL (ref 0.76–1.27)
Globulin, Total: 2.8 g/dL (ref 1.5–4.5)
Glucose: 82 mg/dL (ref 70–99)
Potassium: 5.1 mmol/L (ref 3.5–5.2)
Sodium: 141 mmol/L (ref 134–144)
Total Protein: 7.6 g/dL (ref 6.0–8.5)
eGFR: 88 mL/min/{1.73_m2} (ref >59)

## 2023-10-07 LAB — TESTOSTERONE: Testosterone: 434 ng/dL (ref 264–916)

## 2023-10-07 NOTE — Addendum Note (Signed)
 Addended byChristen Butter on: 10/07/2023 04:28 PM   Modules accepted: Orders

## 2023-10-18 ENCOUNTER — Encounter: Payer: 59 | Admitting: Medical-Surgical

## 2023-10-19 ENCOUNTER — Other Ambulatory Visit (HOSPITAL_COMMUNITY): Payer: Self-pay

## 2023-10-23 ENCOUNTER — Other Ambulatory Visit: Payer: Self-pay

## 2023-10-23 ENCOUNTER — Other Ambulatory Visit (HOSPITAL_COMMUNITY): Payer: Self-pay

## 2023-11-08 ENCOUNTER — Encounter: Payer: Self-pay | Admitting: Physician Assistant

## 2023-11-13 ENCOUNTER — Encounter: Payer: Self-pay | Admitting: Medical-Surgical

## 2023-11-15 ENCOUNTER — Ambulatory Visit: Admitting: Medical-Surgical

## 2023-11-15 ENCOUNTER — Encounter: Payer: Self-pay | Admitting: Medical-Surgical

## 2023-11-15 ENCOUNTER — Other Ambulatory Visit (HOSPITAL_COMMUNITY): Payer: Self-pay

## 2023-11-15 VITALS — BP 126/85 | HR 74 | Resp 20 | Ht 66.0 in | Wt 170.0 lb

## 2023-11-15 DIAGNOSIS — I10 Essential (primary) hypertension: Secondary | ICD-10-CM

## 2023-11-15 MED ORDER — AMLODIPINE BESYLATE 5 MG PO TABS
5.0000 mg | ORAL_TABLET | Freq: Every day | ORAL | 1 refills | Status: DC | PRN
  Filled 2023-11-15: qty 30, 30d supply, fill #0
  Filled 2023-12-02 – 2023-12-25 (×3): qty 30, 30d supply, fill #1
  Filled 2024-01-24: qty 30, 30d supply, fill #2
  Filled 2024-03-02: qty 30, 30d supply, fill #3
  Filled 2024-04-11: qty 30, 30d supply, fill #4
  Filled 2024-05-20: qty 30, 30d supply, fill #5

## 2023-11-15 MED ORDER — IPRATROPIUM BROMIDE 0.03 % NA SOLN
2.0000 | Freq: Two times a day (BID) | NASAL | 0 refills | Status: DC
  Filled 2023-11-15: qty 30, 30d supply, fill #0

## 2023-11-15 NOTE — Progress Notes (Signed)
        Established patient visit  History, exam, impression, and plan:  1. Essential hypertension [I10] (Primary) Very pleasant 48 year old male presenting today with a history of essential hypertension.  He was previously treated with valsartan and amlodipine with good blood pressure control.  Since then, he has lost approximately 20 to 25 pounds and he reports that he is doing very well.  His blood pressure has been well-managed and he was able to come off of the medication.  He does check his blood pressure approximately twice weekly and over the last couple of weeks, has noted pressures in the 150s/90s.  Denies any concerning symptoms today.  Cardiopulmonary exam is normal.  No excessive anxiety/stress as a compounding factor.  He is due to recheck some labs and we will get that done today.  Discussed resuming treatment for hypertension.  Blood pressure here is well-controlled.  Suspect his blood pressure cuff may be providing inaccurate readings.  Would like for him to come for a nurse visit for blood pressure check and bring his cuff for validation.  For peace of mind and to help on days when his blood pressure is significantly elevated, restarting amlodipine 5 mg daily on an as-needed basis for blood pressure greater than 130/80.  Avoid starting amlodipine until home blood pressure monitor has been validated.  Patient is agreeable and verbalized understanding of the plan.   Procedures performed this visit: None.  Return for [place] for BP check and BP cuff validation at your convenience.  __________________________________ Thayer Ohm, DNP, APRN, FNP-BC Primary Care and Sports Medicine [facility] [place]

## 2023-11-16 LAB — CBC
Hematocrit: 47.4 % (ref 37.5–51.0)
Hemoglobin: 15.5 g/dL (ref 13.0–17.7)
MCH: 29.2 pg (ref 26.6–33.0)
MCHC: 32.7 g/dL (ref 31.5–35.7)
MCV: 89 fL (ref 79–97)
Platelets: 197 10*3/uL (ref 150–450)
RBC: 5.3 x10E6/uL (ref 4.14–5.80)
RDW: 13.1 % (ref 11.6–15.4)
WBC: 4.6 10*3/uL (ref 3.4–10.8)

## 2023-11-16 LAB — TESTOSTERONE: Testosterone: 513 ng/dL (ref 264–916)

## 2023-11-17 ENCOUNTER — Encounter: Payer: Self-pay | Admitting: Medical-Surgical

## 2023-11-27 ENCOUNTER — Ambulatory Visit (INDEPENDENT_AMBULATORY_CARE_PROVIDER_SITE_OTHER): Admitting: Family Medicine

## 2023-11-27 VITALS — BP 138/83 | HR 87 | Resp 20

## 2023-11-27 DIAGNOSIS — I10 Essential (primary) hypertension: Secondary | ICD-10-CM | POA: Diagnosis not present

## 2023-11-27 NOTE — Progress Notes (Signed)
 Medical screening examination/treatment was performed by qualified clinical staff member and as supervising physician I was immediately available for consultation/collaboration. I have reviewed documentation and agree with assessment and plan.  Everrett Coombe, DO

## 2023-11-27 NOTE — Progress Notes (Signed)
   Subjective:    Patient ID: Allen Fisher, male    DOB: 1975/11/07, 48 y.o.   MRN: [id number]  HPI  Patient here for blood pressure check. Denies trouble sleeping, palpitations or medication problems.   Review of Systems     Objective:   Physical Exam        Assessment & Plan:   Patient did not bring in home cuff to compare. Advised to keep log for 2 weeks and send readings to PCP through MyChart. Patient stated that he takes medication daily instead of PRN.

## 2023-12-02 ENCOUNTER — Other Ambulatory Visit (HOSPITAL_COMMUNITY): Payer: Self-pay

## 2023-12-02 ENCOUNTER — Other Ambulatory Visit: Payer: Self-pay | Admitting: Medical-Surgical

## 2023-12-18 ENCOUNTER — Other Ambulatory Visit (HOSPITAL_COMMUNITY): Payer: Self-pay

## 2023-12-25 ENCOUNTER — Other Ambulatory Visit (HOSPITAL_COMMUNITY): Payer: Self-pay

## 2024-01-01 ENCOUNTER — Ambulatory Visit: Admitting: Physician Assistant

## 2024-01-24 ENCOUNTER — Other Ambulatory Visit: Payer: Self-pay

## 2024-01-24 ENCOUNTER — Other Ambulatory Visit: Payer: Self-pay | Admitting: Medical-Surgical

## 2024-01-24 ENCOUNTER — Other Ambulatory Visit (HOSPITAL_COMMUNITY): Payer: Self-pay

## 2024-01-24 MED ORDER — IPRATROPIUM BROMIDE 0.03 % NA SOLN
2.0000 | Freq: Two times a day (BID) | NASAL | 0 refills | Status: DC
  Filled 2024-01-24: qty 30, 30d supply, fill #0

## 2024-02-13 NOTE — Progress Notes (Unsigned)
 Ellouise Console, PA-C [address] [place], [place]  [address] Phone: [phone]   Gastroenterology Consultation  Referring Provider:     Willo Mini, NP Primary Care Physician:  Willo Mini, NP Primary Gastroenterologist:  Ellouise Console, PA-C / Glendia Holt, MD  Reason for Consultation:     Loose stools, discuss colonoscopy        HPI:   Allen Fisher is a 48 y.o. y/o male referred for consultation & management  by Willo Mini, NP.  Here to evaluate loose stools and discuss scheduling a colonoscopy.  He had cholecystectomy.  Current symptoms: Patient has had chronic diarrhea for 10 years.  Diarrhea started after having cholecystectomy 10 years ago.  He was started on WelChol  several years ago which does control his diarrhea.  He takes 2 pills every morning and 1 pill in the evening.  He rarely has episode of loose stools if he forgets WelChol  or eats high fat meal.  He denies any other GI symptoms such as abdominal pain, rectal bleeding, or weight loss.    No previous colonoscopy.  No family history of colon cancer.  He is due for his first screening colonoscopy due to age.  Past Medical History:  Diagnosis Date   Environmental allergies    Frequent sinus infections    History of kidney stones    HLD (hyperlipidemia)    Hypertension    Pneumonia    Sleep apnea    does not use cpap   Status post cholecystectomy 12/13/2016    Past Surgical History:  Procedure Laterality Date   BACK SURGERY     CHOLECYSTECTOMY     COLONOSCOPY     DRUG INDUCED ENDOSCOPY Bilateral 11/02/2022   Procedure: DRUG INDUCED ENDOSCOPY;  Surgeon: Carlie Clark, MD;  Location: [facility] OR;  Service: ENT;  Laterality: Bilateral;   KNEE SURGERY Bilateral    x 2   SHOULDER SURGERY Bilateral    x 3 - on right side surg x 2 and 1 on left side   WISDOM TOOTH EXTRACTION      Prior to Admission medications   Medication Sig Start Date End Date Taking? Authorizing Provider  amLODipine  (NORVASC ) 5 MG  tablet Take 1 tablet (5 mg total) by mouth daily as needed. 11/15/23   Willo Mini, NP  colesevelam  (WELCHOL ) 625 MG tablet TAKE 3 TABLETS BY MOUTH  TWICE DAILY WITH A MEAL Patient taking differently: Take 625 mg by mouth daily with breakfast. 04/29/22   Willo Mini, NP  ipratropium (ATROVENT ) 0.03 % nasal spray Place 2 sprays into both nostrils every 12 (twelve) hours. 01/24/24   Willo Mini, NP  testosterone  cypionate (DEPOTESTOSTERONE CYPIONATE) 200 MG/ML injection Inject 1 mL (200 mg) into the muscle every 14 days. 10/06/23   Willo Mini, NP  tirzepatide  (ZEPBOUND ) 15 MG/0.5ML Pen Inject 15 mg into the skin once a week. 10/06/23   Willo Mini, NP    Family History  Problem Relation Age of Onset   Cancer Mother        uterine   Arrhythmia Mother    Healthy Father    Colon cancer Neg Hx    Esophageal cancer Neg Hx      Social History   Tobacco Use   Smoking status: Never   Smokeless tobacco: Current    Types: Snuff  Vaping Use   Vaping status: Never Used  Substance Use Topics   Alcohol use: Yes    Comment: occasionally   Drug  use: No    Allergies as of 02/14/2024   (No Known Allergies)    Review of Systems:    All systems reviewed and negative except where noted in HPI.   Physical Exam:  BP 112/74   Pulse 75   Ht 5' 7 (1.702 m)   Wt 181 lb 2 oz (82.2 kg)   BMI 28.37 kg/m  No LMP for male patient.  General:   Alert,  Well-developed, well-nourished, pleasant and cooperative in NAD Lungs:  Respirations even and unlabored.  Clear throughout to auscultation.   No wheezes, crackles, or rhonchi. No acute distress. Heart:  Regular rate and rhythm; no murmurs, clicks, rubs, or gallops. Abdomen:  Normal bowel sounds.  No bruits.  Soft, and non-distended without masses, hepatosplenomegaly or hernias noted.  No Tenderness.  No guarding or rebound tenderness.    Neurologic:  Alert and oriented x3;  grossly normal neurologically. Psych:  Alert and cooperative. Normal mood and  affect.  Imaging Studies: No results found.  Labs: CBC    Component Value Date/Time   WBC 4.6 11/15/2023 0850   WBC 6.6 01/17/2023 0900   RBC 5.30 11/15/2023 0850   RBC 5.56 01/17/2023 0900   HGB 15.5 11/15/2023 0850   HCT 47.4 11/15/2023 0850   PLT 197 11/15/2023 0850   MCV 89 11/15/2023 0850   MCH 29.2 11/15/2023 0850   MCH 28.6 01/17/2023 0900   MCHC 32.7 11/15/2023 0850   MCHC 32.9 01/17/2023 0900   RDW 13.1 11/15/2023 0850   LYMPHSABS 1.4 10/06/2023 1012   MONOABS 520 12/13/2016 0827   EOSABS 0.3 10/06/2023 1012   BASOSABS 0.1 10/06/2023 1012    CMP     Component Value Date/Time   NA 141 10/06/2023 1012   K 5.1 10/06/2023 1012   CL 106 10/06/2023 1012   CO2 21 10/06/2023 1012   GLUCOSE 82 10/06/2023 1012   GLUCOSE 87 01/17/2023 0900   BUN 8 10/06/2023 1012   CREATININE 1.06 10/06/2023 1012   CREATININE 1.06 01/17/2023 0900   CALCIUM 10.1 10/06/2023 1012   PROT 7.6 10/06/2023 1012   ALBUMIN 4.8 10/06/2023 1012   AST 17 10/06/2023 1012   ALT 19 10/06/2023 1012   ALKPHOS 57 10/06/2023 1012   BILITOT 2.2 (H) 10/06/2023 1012   GFRNONAA 92 07/08/2019 1011   GFRAA 107 07/08/2019 1011    Assessment and Plan:   Willmar Stockinger is a 48 y.o. y/o male has been referred for   Bile Salt Diarrhea, Post Cholecystectomy; Chronic x 10 years: Controlled on Welchol  - Refilled by PCP. - Reassurance. No further GI workup needed.  Colon Cancer Screening: No previous Colonoscopy - Scheduling Colonoscopy with Dr. Stacia I discussed risks of colonoscopy with patient to include risk of bleeding, colon perforation, and risk of sedation.  Patient expressed understanding and agrees to proceed with colonoscopy.   Follow up As needed based on Colonoscopy Results.  Ellouise Console, PA-C

## 2024-02-14 ENCOUNTER — Other Ambulatory Visit (HOSPITAL_COMMUNITY): Payer: Self-pay

## 2024-02-14 ENCOUNTER — Ambulatory Visit: Admitting: Physician Assistant

## 2024-02-14 ENCOUNTER — Encounter: Payer: Self-pay | Admitting: Physician Assistant

## 2024-02-14 VITALS — BP 112/74 | HR 75 | Ht 67.0 in | Wt 181.1 lb

## 2024-02-14 DIAGNOSIS — K9089 Other intestinal malabsorption: Secondary | ICD-10-CM | POA: Diagnosis not present

## 2024-02-14 DIAGNOSIS — Z1211 Encounter for screening for malignant neoplasm of colon: Secondary | ICD-10-CM

## 2024-02-14 MED ORDER — NA SULFATE-K SULFATE-MG SULF 17.5-3.13-1.6 GM/177ML PO SOLN
1.0000 | Freq: Once | ORAL | 0 refills | Status: AC
  Filled 2024-02-14: qty 354, 1d supply, fill #0

## 2024-02-14 NOTE — Patient Instructions (Addendum)
 You have been scheduled for a Colonoscopy. Please follow written instructions given to you at your visit today.   If you use inhalers (even only as needed), please bring them with you on the day of your procedure.  DO NOT TAKE Colesevelam  (Welchol ) 2 days prior to the procedure. 03/23/24  DO NOT TAKE 7 DAYS PRIOR TO TEST- Trulicity (dulaglutide) Ozempic , Wegovy  (semaglutide ) Mounjaro  (tirzepatide ) Bydureon Bcise (exanatide extended release)  DO NOT TAKE 1 DAY PRIOR TO YOUR TEST Rybelsus  (semaglutide ) Adlyxin (lixisenatide) Victoza (liraglutide) Byetta (exanatide) ___________________________________________________________________________  Please follow up sooner if symptoms increase or worsen   Due to recent changes in healthcare laws, you may see the results of your imaging and laboratory studies on MyChart before your provider has had a chance to review them.  We understand that in some cases there may be results that are confusing or concerning to you. Not all laboratory results come back in the same time frame and the provider may be waiting for multiple results in order to interpret others.  Please give us  48 hours in order for your provider to thoroughly review all the results before contacting the office for clarification of your results.   Thank you for trusting me with your gastrointestinal care!   Ellouise Console, PA-C _______________________________________________________  If your blood pressure at your visit was 140/90 or greater, please contact your primary care physician to follow up on this.  _______________________________________________________  If you are age 51 or older, your body mass index should be between 23-30. Your Body mass index is 28.37 kg/m. If this is out of the aforementioned range listed, please consider follow up with your Primary Care Provider.  If you are age 82 or younger, your body mass index should be between 19-25. Your Body mass index is 28.37  kg/m. If this is out of the aformentioned range listed, please consider follow up with your Primary Care Provider.   ________________________________________________________  The [facility] providers would like to encourage you to use MYCHART to communicate with providers for non-urgent requests or questions.  Due to long hold times on the telephone, sending your provider a message by [facility] may be a faster and more efficient way to get a response.  Please allow 48 business hours for a response.  Please remember that this is for non-urgent requests.  _______________________________________________________

## 2024-02-18 ENCOUNTER — Ambulatory Visit
Admission: EM | Admit: 2024-02-18 | Discharge: 2024-02-18 | Disposition: A | Discharge status: Home / Self Care | Attending: Family Medicine | Admitting: Family Medicine

## 2024-02-18 ENCOUNTER — Encounter: Payer: Self-pay | Admitting: Emergency Medicine

## 2024-02-18 DIAGNOSIS — M62830 Muscle spasm of back: Secondary | ICD-10-CM | POA: Diagnosis not present

## 2024-02-18 MED ORDER — METHYLPREDNISOLONE 4 MG PO TBPK: ORAL_TABLET | ORAL | 0 refills | Status: DC

## 2024-02-18 MED ORDER — TIZANIDINE HCL 4 MG PO TABS: 4.0000 mg | ORAL_TABLET | Freq: Four times a day (QID) | ORAL | 0 refills | Status: DC | PRN

## 2024-02-18 NOTE — Discharge Instructions (Signed)
 Take the Medrol  Dosepak as directed.  The steroid medicine is a potent anti-inflammatory that will reduce muscle and nerve inflammation Take tizanidine  as needed as muscle relaxer.  This is useful at bedtime.  It may cause drowsiness during the day May try ice or heat to area Return here or see your doctor if not improving in a few days
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
## 2024-02-21 NOTE — Progress Notes (Signed)
 Agree with the assessment and plan as outlined by Brigitte Canard, PA-C.

## 2024-02-23 ENCOUNTER — Other Ambulatory Visit (HOSPITAL_COMMUNITY): Payer: Self-pay

## 2024-03-19 ENCOUNTER — Encounter: Payer: Self-pay | Admitting: Gastroenterology

## 2024-03-25 ENCOUNTER — Other Ambulatory Visit (HOSPITAL_COMMUNITY): Payer: Self-pay

## 2024-03-25 ENCOUNTER — Telehealth: Payer: Self-pay | Admitting: Physician Assistant

## 2024-03-25 MED ORDER — NA SULFATE-K SULFATE-MG SULF 17.5-3.13-1.6 GM/177ML PO SOLN
1.0000 | Freq: Once | ORAL | 0 refills | Status: DC
  Filled 2024-03-25: qty 354, 1d supply, fill #0

## 2024-03-25 NOTE — Telephone Encounter (Signed)
 Called and informed patient that original RX was sent at time of his OV and pharmacy will put it on hold if it is not picked up. I have sent over a new SUPREP prescription to [facility]. Patient states that someone from our office called and told them they would call [facility] to check and see if they have the prep in stock, patient was not sure who he spoke with but I told him that we will send a message if [facility] does not have prep in stock.

## 2024-03-25 NOTE — Telephone Encounter (Signed)
 Called [facility] pharmacy and reordered this medication. Pharmacy is working on it now. Called patient back and relayed this information.

## 2024-03-25 NOTE — Telephone Encounter (Signed)
 Patient called and stated that he is having a procedure schedule for tomorrow with Dr. Stacia and was wondering when he was going to get his prep medication. Patient is requesting that we send over his prep to [facility]. Please advise.

## 2024-03-25 NOTE — Telephone Encounter (Signed)
 Patient called regarding previous message below. Please advise, thank you

## 2024-03-26 ENCOUNTER — Ambulatory Visit: Admitting: Gastroenterology

## 2024-03-26 ENCOUNTER — Encounter: Payer: Self-pay | Admitting: Gastroenterology

## 2024-03-26 VITALS — BP 115/77 | HR 63 | Temp 97.7°F | Resp 19 | Ht 67.0 in | Wt 181.0 lb

## 2024-03-26 DIAGNOSIS — D128 Benign neoplasm of rectum: Secondary | ICD-10-CM

## 2024-03-26 DIAGNOSIS — K5730 Diverticulosis of large intestine without perforation or abscess without bleeding: Secondary | ICD-10-CM | POA: Diagnosis not present

## 2024-03-26 DIAGNOSIS — Z1211 Encounter for screening for malignant neoplasm of colon: Secondary | ICD-10-CM

## 2024-03-26 DIAGNOSIS — D122 Benign neoplasm of ascending colon: Secondary | ICD-10-CM

## 2024-03-26 DIAGNOSIS — K9089 Other intestinal malabsorption: Secondary | ICD-10-CM

## 2024-03-26 MED ORDER — SODIUM CHLORIDE 0.9 % IV SOLN: 500.0000 mL | Freq: Once | INTRAVENOUS | Status: DC

## 2024-03-26 NOTE — Patient Instructions (Addendum)
 Educational handout provided to patient related to Polyps and Diverticulosis  Resume previous diet  Continue present medications  Awaiting pathology results  YOU HAD AN ENDOSCOPIC PROCEDURE TODAY AT THE [facility]:   Refer to the procedure report that was given to you for any specific questions about what was found during the examination.  If the procedure report does not answer your questions, please call your gastroenterologist to clarify.  If you requested that your care partner not be given the details of your procedure findings, then the procedure report has been included in a sealed envelope for you to review at your convenience later.  YOU SHOULD EXPECT: Some feelings of bloating in the abdomen. Passage of more gas than usual.  Walking can help get rid of the air that was put into your GI tract during the procedure and reduce the bloating. If you had a lower endoscopy (such as a colonoscopy or flexible sigmoidoscopy) you may notice spotting of blood in your stool or on the toilet paper. If you underwent a bowel prep for your procedure, you may not have a normal bowel movement for a few days.  Please Note:  You might notice some irritation and congestion in your nose or some drainage.  This is from the oxygen used during your procedure.  There is no need for concern and it should clear up in a day or so.  SYMPTOMS TO REPORT IMMEDIATELY:  Following lower endoscopy (colonoscopy or flexible sigmoidoscopy):  Excessive amounts of blood in the stool  Significant tenderness or worsening of abdominal pains  Swelling of the abdomen that is new, acute  Fever of 100F or higher  For urgent or emergent issues, a gastroenterologist can be reached at any hour by calling [phone]. Do not use MyChart messaging for urgent concerns.    DIET:  We do recommend a small meal at first, but then you may proceed to your regular diet.  Drink plenty of fluids but you should avoid alcoholic  beverages for 24 hours.  ACTIVITY:  You should plan to take it easy for the rest of today and you should NOT DRIVE or use heavy machinery until tomorrow (because of the sedation medicines used during the test).    FOLLOW UP: Our staff will call the number listed on your records the next business day following your procedure.  We will call around 7:15- 8:00 am to check on you and address any questions or concerns that you may have regarding the information given to you following your procedure. If we do not reach you, we will leave a message.     If any biopsies were taken you will be contacted by phone or by letter within the next 1-3 weeks.  Please call us  at [phone] if you have not heard about the biopsies in 3 weeks.    SIGNATURES/CONFIDENTIALITY: You and/or your care partner have signed paperwork which will be entered into your electronic medical record.  These signatures attest to the fact that that the information above on your After Visit Summary has been reviewed and is understood.  Full responsibility of the confidentiality of this discharge information lies with you and/or your care-partner.

## 2024-03-26 NOTE — Progress Notes (Signed)
 A/o x 3, VSS, gd SR's, pleased with anesthesia, report to RN

## 2024-03-26 NOTE — Progress Notes (Signed)
 Updated medical record.

## 2024-03-26 NOTE — Progress Notes (Signed)
 Called to room to assist during endoscopic procedure.  Patient ID and intended procedure confirmed with present staff. Received instructions for my participation in the procedure from the performing physician.

## 2024-03-26 NOTE — Op Note (Signed)
 [facility] Patient Name: Allen Fisher Procedure Date: 03/26/2024 8:27 AM MRN: [id number] Endoscopist: Glendia E. Stacia , MD, [id number] Age: 48 Referring MD:  Date of Birth: 1976-08-08 Gender: Male Account #: [id number] Procedure:                Colonoscopy Indications:              Screening for colorectal malignant neoplasm, This                            is the patient's first colonoscopy Medicines:                Monitored Anesthesia Care Procedure:                Pre-Anesthesia Assessment:                           - Prior to the procedure, a History and Physical                            was performed, and patient medications and                            allergies were reviewed. The patient's tolerance of                            previous anesthesia was also reviewed. The risks                            and benefits of the procedure and the sedation                            options and risks were discussed with the patient.                            All questions were answered, and informed consent                            was obtained. Prior Anticoagulants: The patient has                            taken no anticoagulant or antiplatelet agents. ASA                            Grade Assessment: II - A patient with mild systemic                            disease. After reviewing the risks and benefits,                            the patient was deemed in satisfactory condition to                            undergo the procedure.  After obtaining informed consent, the colonoscope                            was passed under direct vision. Throughout the                            procedure, the patient's blood pressure, pulse, and                            oxygen saturations were monitored continuously. The                            Colonoscope was introduced through the anus and                            advanced to the the  terminal ileum, with                            identification of the appendiceal orifice and IC                            valve. The colonoscopy was performed without                            difficulty. The patient tolerated the procedure                            well. The quality of the bowel preparation was                            good. The terminal ileum, ileocecal valve,                            appendiceal orifice, and rectum were photographed.                            The bowel preparation used was SUPREP via split                            dose instruction. Scope In: 8:44:30 AM Scope Out: 8:56:31 AM Scope Withdrawal Time: 0 hours 8 minutes 57 seconds  Total Procedure Duration: 0 hours 12 minutes 1 second  Findings:                 The perianal and digital rectal examinations were                            normal. Pertinent negatives include normal                            sphincter tone and no palpable rectal lesions.                           A 4 mm polyp was found in the ascending colon. The  polyp was sessile. The polyp was removed with a                            cold snare. Resection and retrieval were complete.                            Estimated blood loss was minimal.                           A 3 mm polyp was found in the proximal rectum. The                            polyp was sessile. The polyp was removed with a                            cold snare. Resection and retrieval were complete.                            Estimated blood loss was minimal.                           A few small-mouthed diverticula were found in the                            sigmoid colon.                           The exam was otherwise normal throughout the                            examined colon.                           The terminal ileum appeared normal.                           The retroflexed view of the distal rectum and anal                             verge was normal and showed no anal or rectal                            abnormalities. Complications:            No immediate complications. Estimated Blood Loss:     Estimated blood loss was minimal. Impression:               - One 4 mm polyp in the ascending colon, removed                            with a cold snare. Resected and retrieved.                           - One 3 mm polyp in the proximal rectum, removed  with a cold snare. Resected and retrieved.                           - Mild diverticulosis in the sigmoid colon.                           - The examined portion of the ileum was normal.                           - The distal rectum and anal verge are normal on                            retroflexion view. Recommendation:           - Patient has a contact number available for                            emergencies. The signs and symptoms of potential                            delayed complications were discussed with the                            patient. Return to normal activities tomorrow.                            Written discharge instructions were provided to the                            patient.                           - Resume previous diet.                           - Continue present medications.                           - Await pathology results.                           - Repeat colonoscopy (date not yet determined) for                            surveillance based on pathology results. Latrise Bowland E. Stacia, MD 03/26/2024 9:04:25 AM This report has been signed electronically.

## 2024-03-26 NOTE — Progress Notes (Signed)
 [facility] History and Physical   Primary Care Physician:  Willo Mini, NP   Reason for Procedure:   Colon cancer screening  Plan:    Screening colonoscopy     HPI: Allen Fisher is a 48 y.o. male undergoing initial average risk screening colonoscopy.  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    Past Medical History:  Diagnosis Date   Environmental allergies    Frequent sinus infections    History of kidney stones    HLD (hyperlipidemia)    Hypertension    Pneumonia    Sleep apnea    does not use cpap   Status post cholecystectomy 12/13/2016    Past Surgical History:  Procedure Laterality Date   BACK SURGERY     CHOLECYSTECTOMY     DRUG INDUCED ENDOSCOPY Bilateral 11/02/2022   Procedure: DRUG INDUCED ENDOSCOPY;  Surgeon: Carlie Clark, MD;  Location: [facility] OR;  Service: ENT;  Laterality: Bilateral;   KNEE SURGERY Bilateral    x 2   SHOULDER SURGERY Bilateral    x 3 - on right side surg x 2 and 1 on left side   WISDOM TOOTH EXTRACTION      Prior to Admission medications   Medication Sig Start Date End Date Taking? Authorizing Provider  amLODipine  (NORVASC ) 5 MG tablet Take 1 tablet (5 mg total) by mouth daily as needed. 11/15/23   Willo Mini, NP  colesevelam  (WELCHOL ) 625 MG tablet TAKE 3 TABLETS BY MOUTH  TWICE DAILY WITH A MEAL Patient taking differently: Take 625 mg by mouth daily with breakfast. 04/29/22   Willo Mini, NP  ipratropium (ATROVENT ) 0.03 % nasal spray Place 2 sprays into both nostrils every 12 (twelve) hours. 01/24/24   Willo Mini, NP  methylPREDNISolone  (MEDROL  DOSEPAK) 4 MG TBPK tablet tad 02/18/24   Maranda Jamee Jacob, MD  Na Sulfate-K Sulfate-Mg Sulfate concentrate (SUPREP) 17.5-3.13-1.6 GM/177ML SOLN Take 1 kit (354 mLs total) by mouth once for 1 dose. 03/25/24 03/26/24  Stacia Glendia BRAVO, MD  testosterone   cypionate (DEPOTESTOSTERONE CYPIONATE) 200 MG/ML injection Inject 1 mL (200 mg) into the muscle every 14 days. 10/06/23   Willo Mini, NP  tirzepatide  (ZEPBOUND ) 15 MG/0.5ML Pen Inject 15 mg into the skin once a week. 10/06/23   Willo Mini, NP  tiZANidine  (ZANAFLEX ) 4 MG tablet Take 1-2 tablets (4-8 mg total) by mouth every 6 (six) hours as needed for muscle spasms. 02/18/24   Maranda Jamee Jacob, MD    Current Outpatient Medications  Medication Sig Dispense Refill   amLODipine  (NORVASC ) 5 MG tablet Take 1 tablet (5 mg total) by mouth daily as needed. 90 tablet 1   colesevelam  (WELCHOL ) 625 MG tablet TAKE 3 TABLETS BY MOUTH  TWICE DAILY WITH A MEAL (Patient taking differently: Take 625 mg by mouth daily with breakfast.) 540 tablet 3   ipratropium (ATROVENT ) 0.03 % nasal spray Place 2 sprays into both nostrils every 12 (twelve) hours. 30 mL 0   methylPREDNISolone  (MEDROL  DOSEPAK) 4 MG TBPK tablet tad 21 tablet 0   Na Sulfate-K Sulfate-Mg Sulfate concentrate (SUPREP) 17.5-3.13-1.6 GM/177ML SOLN Take 1 kit (354 mLs total) by mouth once for 1 dose. 354 mL 0   testosterone  cypionate (DEPOTESTOSTERONE CYPIONATE) 200 MG/ML injection Inject 1 mL (200 mg) into the muscle every 14 days. 2 mL 3   tirzepatide  (ZEPBOUND ) 15 MG/0.5ML Pen Inject 15 mg into the  skin once a week. 2 mL 11   tiZANidine  (ZANAFLEX ) 4 MG tablet Take 1-2 tablets (4-8 mg total) by mouth every 6 (six) hours as needed for muscle spasms. 21 tablet 0   Current Facility-Administered Medications  Medication Dose Route Frequency Provider Last Rate Last Admin   0.9 %  sodium chloride  infusion  500 mL Intravenous Once Stacia Glendia BRAVO, MD        Allergies as of 03/26/2024   (No Known Allergies)    Family History  Problem Relation Age of Onset   Cancer Mother        uterine   Arrhythmia Mother    Healthy Father    Colon cancer Neg Hx    Esophageal cancer Neg Hx     Social History   Socioeconomic History   Marital status: Married     Spouse name: Not on file   Number of children: Not on file   Years of education: Not on file   Highest education level: Associate degree: occupational, Scientist, product/process development, or vocational program  Occupational History   Not on file  Tobacco Use   Smoking status: Never   Smokeless tobacco: Current    Types: Snuff  Vaping Use   Vaping status: Never Used  Substance and Sexual Activity   Alcohol use: Yes    Comment: occasionally   Drug use: No   Sexual activity: Yes    Partners: Female  Other Topics Concern   Not on file  Social History Narrative   Not on file   Social Drivers of Health   Financial Resource Strain: Low Risk  (10/06/2023)   Overall Financial Resource Strain (CARDIA)    Difficulty of Paying Living Expenses: Not hard at all  Food Insecurity: No Food Insecurity (10/06/2023)   Hunger Vital Sign    Worried About Running Out of Food in the Last Year: Never true    Ran Out of Food in the Last Year: Never true  Transportation Needs: No Transportation Needs (10/06/2023)   PRAPARE - Administrator, Civil Service (Medical): No    Lack of Transportation (Non-Medical): No  Physical Activity: Sufficiently Active (10/06/2023)   Exercise Vital Sign    Days of Exercise per Week: 6 days    Minutes of Exercise per Session: 30 min  Stress: No Stress Concern Present (10/06/2023)   [employer] of Occupational Health - Occupational Stress Questionnaire    Feeling of Stress : Not at all  Social Connections: Moderately Integrated (10/06/2023)   Social Connection and Isolation Panel    Frequency of Communication with Friends and Family: Once a week    Frequency of Social Gatherings with Friends and Family: Once a week    Attends Religious Services: More than 4 times per year    Active Member of [employer] or Organizations: Yes    Attends Banker Meetings: More than 4 times per year    Marital Status: Married  Catering manager Violence: Unknown (11/15/2021)   Received  from [facility]   HITS    Physically Hurt: Not on file    Insult or Talk Down To: Not on file    Threaten Physical Harm: Not on file    Scream or Curse: Not on file    Review of Systems:  All other review of systems negative except as mentioned in the HPI.  Physical Exam: Vital signs BP 106/72   Pulse 62   Temp 97.7 F (36.5 C) (Temporal)   Ht  5' 7 (1.702 m)   Wt 181 lb (82.1 kg)   SpO2 98%   BMI 28.35 kg/m   General:   Alert,  Well-developed, well-nourished, pleasant and cooperative in NAD Airway:  Mallampati 2 Lungs:  Clear throughout to auscultation.   Heart:  Regular rate and rhythm; no murmurs, clicks, rubs,  or gallops. Abdomen:  Soft, nontender and nondistended. Normal bowel sounds.   Neuro/Psych:  Normal mood and affect. A and O x 3   Thadius Smisek E. Stacia, MD [facility]

## 2024-03-27 ENCOUNTER — Telehealth: Payer: Self-pay | Admitting: *Deleted

## 2024-03-27 NOTE — Telephone Encounter (Signed)
Attempted to call patient for their post-procedure follow-up call. No answer. Left voicemail.   

## 2024-03-29 LAB — SURGICAL PATHOLOGY

## 2024-04-01 ENCOUNTER — Ambulatory Visit: Payer: Self-pay | Admitting: Gastroenterology

## 2024-04-01 NOTE — Progress Notes (Signed)
 Allen Fisher,  The two polyps which I removed during your recent procedure were proven to be completely benign but are considered pre-cancerous polyps that MAY have grown into cancer if they had not been removed.  Studies shows that at least 20% of women over age 46 and 30% of men over age 66 have pre-cancerous polyps.  Based on current nationally recognized surveillance guidelines, I recommend that you have a repeat colonoscopy in 7 years.   If you develop any new rectal bleeding, abdominal pain or significant bowel habit changes, please contact me before then.

## 2024-04-10 ENCOUNTER — Other Ambulatory Visit (HOSPITAL_COMMUNITY): Payer: Self-pay

## 2024-04-11 ENCOUNTER — Telehealth (HOSPITAL_COMMUNITY): Payer: Self-pay

## 2024-04-11 ENCOUNTER — Other Ambulatory Visit (HOSPITAL_COMMUNITY): Payer: Self-pay

## 2024-04-11 NOTE — Telephone Encounter (Signed)
 PA request has been Received. New Encounter has been or will be created for follow up. For additional info see Pharmacy Prior Auth telephone encounter from 04/11/24.

## 2024-04-11 NOTE — Telephone Encounter (Signed)
 Pharmacy Patient Advocate Encounter  Received notification from [employer] that Prior Authorization for Zepbound  15MG /0.5ML pen-injectors  has been APPROVED from 04/11/24 to 04/11/25. Ran test claim, Copay is $24.99. This test claim was processed through [facility]- copay amounts may vary at other pharmacies due to pharmacy/plan contracts, or as the patient moves through the different stages of their insurance plan.   PA #/Case ID/Reference #: [id number]

## 2024-04-11 NOTE — Telephone Encounter (Signed)
 Pharmacy Patient Advocate Encounter   Received notification from Pt Calls Messages that prior authorization for Zepbound  15MG /0.5ML pen-injectors  is required/requested.   Insurance verification completed.   The patient is insured through [facility] .   Per test claim: PA required; PA submitted to above mentioned insurance via Latent Key/confirmation #/EOC [id number] Status is pending

## 2024-05-06 ENCOUNTER — Ambulatory Visit: Admitting: Medical-Surgical

## 2024-05-06 ENCOUNTER — Encounter: Payer: Self-pay | Admitting: Medical-Surgical

## 2024-05-06 VITALS — BP 134/77 | HR 91 | Temp 99.5°F | Resp 20 | Ht 67.0 in | Wt 173.0 lb

## 2024-05-06 DIAGNOSIS — H6501 Acute serous otitis media, right ear: Secondary | ICD-10-CM | POA: Diagnosis not present

## 2024-05-06 DIAGNOSIS — J039 Acute tonsillitis, unspecified: Secondary | ICD-10-CM | POA: Insufficient documentation

## 2024-05-06 MED ORDER — METHYLPREDNISOLONE ACETATE 80 MG/ML IJ SUSP
80.0000 mg | Freq: Once | INTRAMUSCULAR | Status: AC
  Administered 2024-05-06: 80 mg via INTRAMUSCULAR

## 2024-05-06 MED ORDER — AMOXICILLIN-POT CLAVULANATE 875-125 MG PO TABS: 1.0000 | ORAL_TABLET | Freq: Two times a day (BID) | ORAL | 0 refills | Status: DC

## 2024-05-06 NOTE — Assessment & Plan Note (Signed)
 Suspected bacterial infection due to severity and unilateral presentation. No significant improvement with current prednisone  regimen. - Initiate Augmentin  BID for 10 days. - Administer Depo-Medrol  injection today, hold prednisone  today, resume tomorrow. - Monitor for improvement within 48 hours; re-evaluate if no improvement by Thursday or Friday.

## 2024-05-06 NOTE — Progress Notes (Signed)
 Established patient visit   History of Present Illness   Discussed the use of AI scribe software for clinical note transcription with the patient, who gave verbal consent to proceed.  History of Present Illness   Allen Fisher is a 48 year old male who presents with throat pain and swelling.  Oropharyngeal pain and swelling - Throat pain and swelling since Friday night - Initial symptom of dry mouth while sleeping - Progression to sensation of enlarged tonsils - Shooting pain radiating to ear - Difficulty swallowing, especially at night when throat feels dry - Sensation of tonsil stones, unable to confirm visually - No significant fever  Nasal and sinus symptoms - Congestion experienced last night for the first time - No persistent sinus congestion - No cough - No nasal drip  Medication use and response - Currently taking prednisone  4 mg without improvement in symptoms - No known allergies to antibiotics      Physical Exam   Physical Exam Vitals and nursing note reviewed.  Constitutional:      General: He is not in acute distress.    Appearance: Normal appearance. He is well-developed. He is ill-appearing.  HENT:     Head: Normocephalic and atraumatic.     Right Ear: Tympanic membrane is erythematous (Cloudy).     Left Ear: Tympanic membrane and ear canal normal.  No middle ear effusion. Tympanic membrane is not erythematous.     Mouth/Throat:     Mouth: Mucous membranes are moist.     Pharynx: Uvula midline. Pharyngeal swelling and posterior oropharyngeal erythema (Right side) present.     Tonsils: No tonsillar exudate.  Cardiovascular:     Rate and Rhythm: Normal rate and regular rhythm.     Pulses: Normal pulses.     Heart sounds: Normal heart sounds. No murmur heard.    No friction rub. No gallop.  Pulmonary:     Effort: Pulmonary effort is normal. No respiratory distress.     Breath sounds: Normal breath sounds.  Lymphadenopathy:     Cervical:  Cervical adenopathy (Right submandibular) present.  Skin:    General: Skin is warm and dry.  Neurological:     Mental Status: He is alert and oriented to person, place, and time.  Psychiatric:        Mood and Affect: Mood normal.        Behavior: Behavior normal.        Thought Content: Thought content normal.        Judgment: Judgment normal.    Assessment & Plan   Problem List Items Addressed This Visit       Respiratory   Tonsillitis   Suspected bacterial infection due to severity and unilateral presentation. No significant improvement with current prednisone  regimen. - Initiate Augmentin  BID for 10 days. - Administer Depo-Medrol  injection today, hold prednisone  today, resume tomorrow. - Monitor for improvement within 48 hours; re-evaluate if no improvement by Thursday or Friday.        Nervous and Auditory   Non-recurrent acute serous otitis media of right ear - Primary   Suspected bacterial infection due to severity and unilateral presentation. No significant improvement with current prednisone  regimen. - Initiate Augmentin  BID for 10 days. - Administer Depo-Medrol  injection today, hold prednisone  today, resume tomorrow. - Monitor for improvement within 48 hours; re-evaluate if no improvement by Thursday or Friday.      Relevant Medications   amoxicillin -clavulanate (AUGMENTIN ) 875-125 MG tablet  Follow up   Return if symptoms worsen or fail to improve. __________________________________ Zada FREDRIK Palin, DNP, APRN, FNP-BC Primary Care and Sports Medicine [facility]

## 2024-05-08 ENCOUNTER — Encounter: Payer: Self-pay | Admitting: Medical-Surgical

## 2024-06-26 ENCOUNTER — Other Ambulatory Visit: Payer: Self-pay | Admitting: Medical-Surgical

## 2024-06-27 ENCOUNTER — Other Ambulatory Visit: Payer: Self-pay

## 2024-06-27 ENCOUNTER — Other Ambulatory Visit (HOSPITAL_COMMUNITY): Payer: Self-pay

## 2024-06-27 MED ORDER — AMLODIPINE BESYLATE 5 MG PO TABS
5.0000 mg | ORAL_TABLET | Freq: Every day | ORAL | 1 refills | Status: AC | PRN
  Filled 2024-06-27: qty 30, 30d supply, fill #0
  Filled 2024-08-03: qty 30, 30d supply, fill #1
  Filled 2024-09-18: qty 30, 30d supply, fill #2

## 2024-07-19 ENCOUNTER — Other Ambulatory Visit (HOSPITAL_COMMUNITY): Payer: Self-pay

## 2024-07-23 ENCOUNTER — Encounter: Payer: Self-pay | Admitting: Medical-Surgical

## 2024-07-23 DIAGNOSIS — K529 Noninfective gastroenteritis and colitis, unspecified: Secondary | ICD-10-CM

## 2024-07-23 MED ORDER — COLESEVELAM HCL 625 MG PO TABS: ORAL_TABLET | ORAL | 1 refills | Status: AC

## 2024-07-23 MED ORDER — COLESEVELAM HCL 625 MG PO TABS: ORAL_TABLET | ORAL | 0 refills | Status: DC

## 2024-07-29 ENCOUNTER — Other Ambulatory Visit (HOSPITAL_COMMUNITY): Payer: Self-pay

## 2024-07-30 ENCOUNTER — Other Ambulatory Visit (HOSPITAL_COMMUNITY): Payer: Self-pay

## 2024-07-31 ENCOUNTER — Encounter: Payer: Self-pay | Admitting: Pharmacist

## 2024-07-31 ENCOUNTER — Other Ambulatory Visit: Payer: Self-pay

## 2024-08-29 ENCOUNTER — Other Ambulatory Visit (HOSPITAL_COMMUNITY): Payer: Self-pay

## 2024-09-05 ENCOUNTER — Encounter: Payer: Self-pay | Admitting: Medical-Surgical

## 2024-09-18 ENCOUNTER — Other Ambulatory Visit (HOSPITAL_COMMUNITY): Payer: Self-pay

## 2024-09-18 ENCOUNTER — Other Ambulatory Visit: Payer: Self-pay

## 2024-09-19 ENCOUNTER — Other Ambulatory Visit (HOSPITAL_COMMUNITY): Payer: Self-pay

## 2024-09-19 ENCOUNTER — Ambulatory Visit

## 2024-09-19 ENCOUNTER — Ambulatory Visit: Admitting: Medical-Surgical

## 2024-09-19 ENCOUNTER — Encounter: Payer: Self-pay | Admitting: Medical-Surgical

## 2024-09-19 VITALS — BP 127/92 | HR 81 | Resp 20 | Ht 67.0 in | Wt 175.0 lb

## 2024-09-19 DIAGNOSIS — M542 Cervicalgia: Secondary | ICD-10-CM

## 2024-09-19 DIAGNOSIS — G4733 Obstructive sleep apnea (adult) (pediatric): Secondary | ICD-10-CM

## 2024-09-19 DIAGNOSIS — I10 Essential (primary) hypertension: Secondary | ICD-10-CM

## 2024-09-19 DIAGNOSIS — G8929 Other chronic pain: Secondary | ICD-10-CM

## 2024-09-19 DIAGNOSIS — E291 Testicular hypofunction: Secondary | ICD-10-CM

## 2024-09-19 DIAGNOSIS — E6609 Other obesity due to excess calories: Secondary | ICD-10-CM

## 2024-09-19 DIAGNOSIS — R7303 Prediabetes: Secondary | ICD-10-CM

## 2024-09-19 MED ORDER — ZEPBOUND 15 MG/0.5ML ~~LOC~~ SOAJ: 15.0000 mg | SUBCUTANEOUS | 11 refills | Status: AC

## 2024-09-19 MED ORDER — AMLODIPINE BESYLATE 2.5 MG PO TABS
2.5000 mg | ORAL_TABLET | Freq: Every day | ORAL | 3 refills | Status: AC
  Filled 2024-09-19: qty 30, 30d supply, fill #0

## 2024-09-19 NOTE — Progress Notes (Unsigned)
" ° °       Established patient visit   History of Present Illness   Discussed the use of AI scribe software for clinical note transcription with the patient, who gave verbal consent to proceed.  History of Present Illness          Physical Exam   Physical Exam Vitals reviewed.  Constitutional:      General: He is not in acute distress.    Appearance: Normal appearance. He is not ill-appearing.  HENT:     Head: Normocephalic and atraumatic.  Cardiovascular:     Rate and Rhythm: Normal rate and regular rhythm.     Pulses: Normal pulses.     Heart sounds: Normal heart sounds. No murmur heard.    No friction rub. No gallop.  Pulmonary:     Effort: Pulmonary effort is normal. No respiratory distress.     Breath sounds: Normal breath sounds.  Skin:    General: Skin is warm and dry.  Neurological:     Mental Status: He is alert and oriented to person, place, and time.  Psychiatric:        Mood and Affect: Mood normal.        Behavior: Behavior normal.        Thought Content: Thought content normal.        Judgment: Judgment normal.    Assessment & Plan   Assessment and Plan           Follow up   Return in about 2 weeks (around 10/03/2024) for nurse visit for BP check. __________________________________ Zada FREDRIK Palin, DNP, APRN, FNP-BC Primary Care and Sports Medicine [facility] "

## 2024-09-20 ENCOUNTER — Ambulatory Visit: Payer: Self-pay | Admitting: Medical-Surgical

## 2024-09-20 ENCOUNTER — Other Ambulatory Visit (HOSPITAL_COMMUNITY): Payer: Self-pay

## 2024-09-20 DIAGNOSIS — B351 Tinea unguium: Secondary | ICD-10-CM

## 2024-09-20 LAB — CBC WITH DIFFERENTIAL/PLATELET
Basophils Absolute: 0.1 10*3/uL (ref 0.0–0.2)
Basos: 1 %
EOS (ABSOLUTE): 0.3 10*3/uL (ref 0.0–0.4)
Eos: 5 %
Hematocrit: 48.4 % (ref 37.5–51.0)
Hemoglobin: 16.1 g/dL (ref 13.0–17.7)
Immature Grans (Abs): 0 10*3/uL (ref 0.0–0.1)
Immature Granulocytes: 0 %
Lymphocytes Absolute: 1.4 10*3/uL (ref 0.7–3.1)
Lymphs: 25 %
MCH: 29.5 pg (ref 26.6–33.0)
MCHC: 33.3 g/dL (ref 31.5–35.7)
MCV: 89 fL (ref 79–97)
Monocytes Absolute: 0.5 10*3/uL (ref 0.1–0.9)
Monocytes: 9 %
Neutrophils Absolute: 3.3 10*3/uL (ref 1.4–7.0)
Neutrophils: 60 %
Platelets: 164 10*3/uL (ref 150–450)
RBC: 5.45 x10E6/uL (ref 4.14–5.80)
RDW: 12.4 % (ref 11.6–15.4)
WBC: 5.5 10*3/uL (ref 3.4–10.8)

## 2024-09-20 LAB — TESTOSTERONE: Testosterone: 369 ng/dL (ref 264–916)

## 2024-09-20 LAB — COMPREHENSIVE METABOLIC PANEL WITH GFR
ALT: 32 [IU]/L (ref 0–44)
AST: 21 [IU]/L (ref 0–40)
Albumin: 4.3 g/dL (ref 4.1–5.1)
Alkaline Phosphatase: 54 [IU]/L (ref 47–123)
BUN/Creatinine Ratio: 10 (ref 9–20)
BUN: 9 mg/dL (ref 6–24)
Bilirubin Total: 1.4 mg/dL — ABNORMAL HIGH (ref 0.0–1.2)
CO2: 22 mmol/L (ref 20–29)
Calcium: 9.3 mg/dL (ref 8.7–10.2)
Chloride: 109 mmol/L — ABNORMAL HIGH (ref 96–106)
Creatinine, Ser: 0.9 mg/dL (ref 0.76–1.27)
Globulin, Total: 2.1 g/dL (ref 1.5–4.5)
Glucose: 98 mg/dL (ref 70–99)
Potassium: 4.1 mmol/L (ref 3.5–5.2)
Sodium: 143 mmol/L (ref 134–144)
Total Protein: 6.4 g/dL (ref 6.0–8.5)
eGFR: 107 mL/min/{1.73_m2}

## 2024-09-20 LAB — LIPID PANEL
Chol/HDL Ratio: 3.2 ratio (ref 0.0–5.0)
Cholesterol, Total: 140 mg/dL (ref 100–199)
HDL: 44 mg/dL
LDL Chol Calc (NIH): 76 mg/dL (ref 0–99)
Triglycerides: 106 mg/dL (ref 0–149)
VLDL Cholesterol Cal: 20 mg/dL (ref 5–40)

## 2024-09-20 LAB — HEMOGLOBIN A1C
Est. average glucose Bld gHb Est-mCnc: 94 mg/dL
Hgb A1c MFr Bld: 4.9 % (ref 4.8–5.6)

## 2024-09-20 MED ORDER — TERBINAFINE HCL 250 MG PO TABS: 250.0000 mg | ORAL_TABLET | Freq: Every day | ORAL | 0 refills | Status: AC

## 2024-10-08 ENCOUNTER — Ambulatory Visit
